# Patient Record
Sex: Female | Born: 1965 | Race: White | Hispanic: No | Marital: Married | Smoking: Former smoker
Health system: Southern US, Community
[De-identification: ages and names within clinical notes are randomized; demographics above are authoritative.]

## PROBLEM LIST (undated history)

## (undated) DIAGNOSIS — C801 Malignant (primary) neoplasm, unspecified: Secondary | ICD-10-CM

## (undated) HISTORY — PX: TONSILLECTOMY: SUR1361

## (undated) HISTORY — PX: ABDOMINAL HYSTERECTOMY: SHX81

## (undated) HISTORY — PX: MOUTH SURGERY: SHX715

## (undated) HISTORY — PX: APPENDECTOMY: SHX54

## (undated) HISTORY — PX: TOTAL ABDOMINAL HYSTERECTOMY W/ BILATERAL SALPINGOOPHORECTOMY: SHX83

---

## 2005-05-22 ENCOUNTER — Ambulatory Visit: Payer: Self-pay | Admitting: Unknown Physician Specialty

## 2006-02-25 ENCOUNTER — Emergency Department: Payer: Self-pay | Admitting: Emergency Medicine

## 2011-09-04 ENCOUNTER — Ambulatory Visit: Payer: Self-pay | Admitting: Internal Medicine

## 2011-09-04 LAB — HM MAMMOGRAPHY: HM Mammogram: NORMAL

## 2013-05-11 ENCOUNTER — Ambulatory Visit: Payer: Self-pay | Admitting: Internal Medicine

## 2013-07-04 IMAGING — MG MMM DGT SCR NO ORDER W/CAD
1 series · 4 of 4 positions shown · non-contrast
Comparison: none

REASON FOR EXAM: SCR MAMMO NO ORDER
COMMENTS:

PROCEDURE:     MMM - MMM DGT SCR NO ORDER W/CAD  - September 04, 2011  [DATE]
RESULT:
Comparisons: 05/22/2005 and 04/27/2003.

[Series 8200: R CC · right · 4 of 4 slices shown]
[im 1/4]
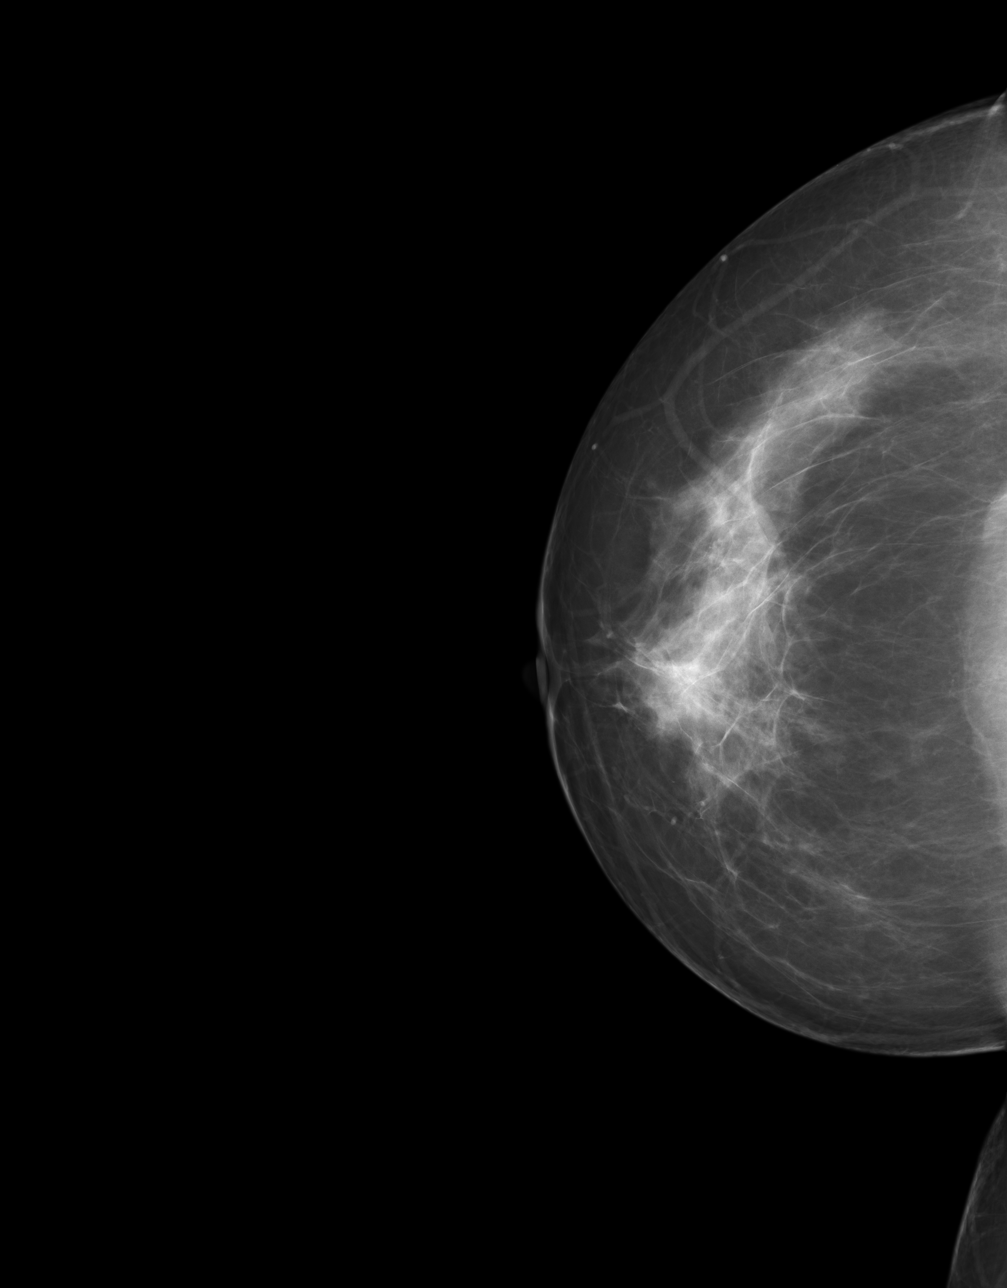
[im 2/4]
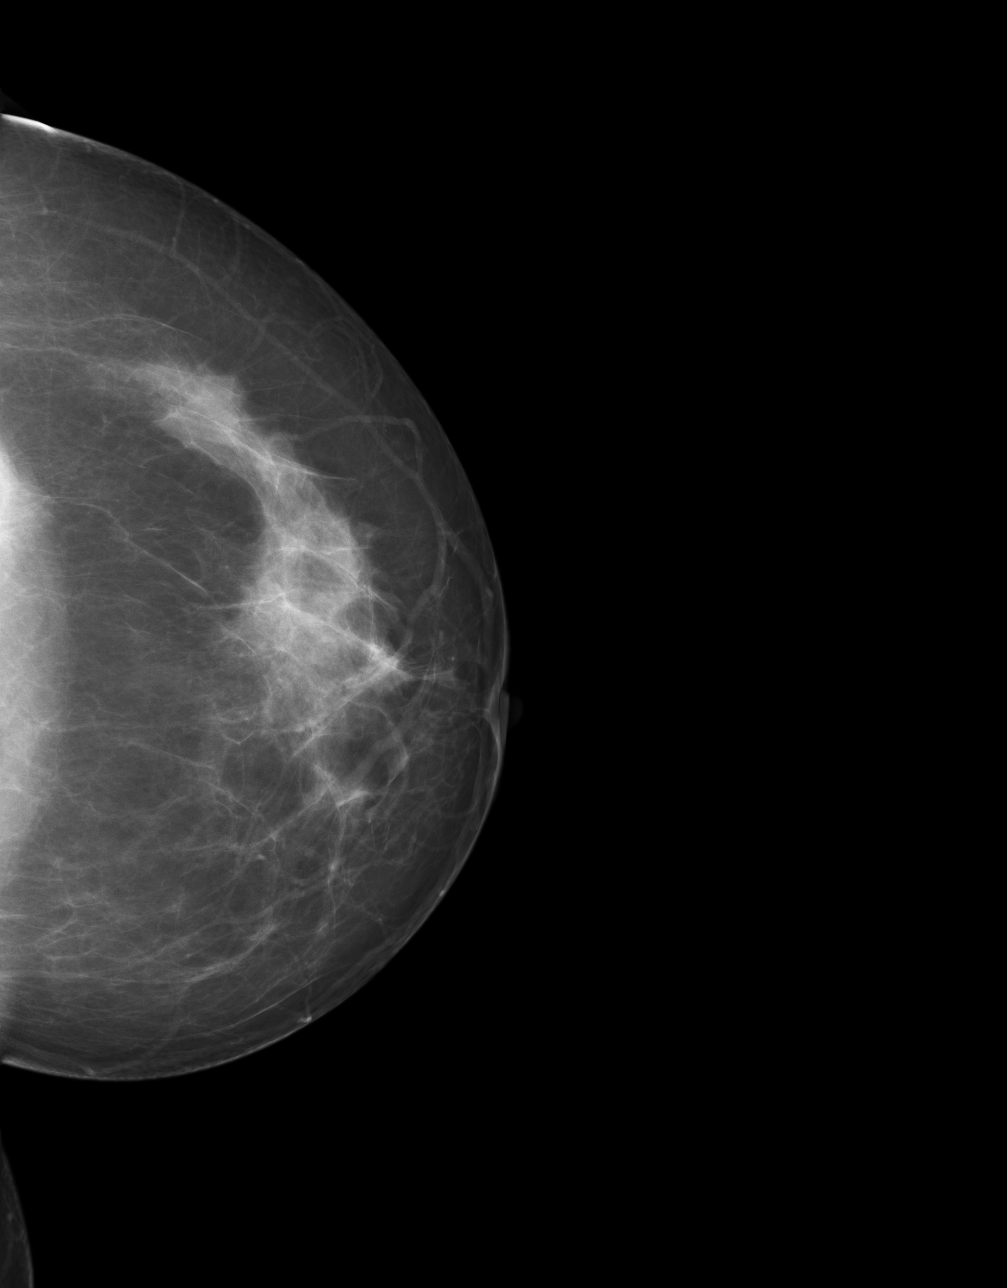
[im 3/4]
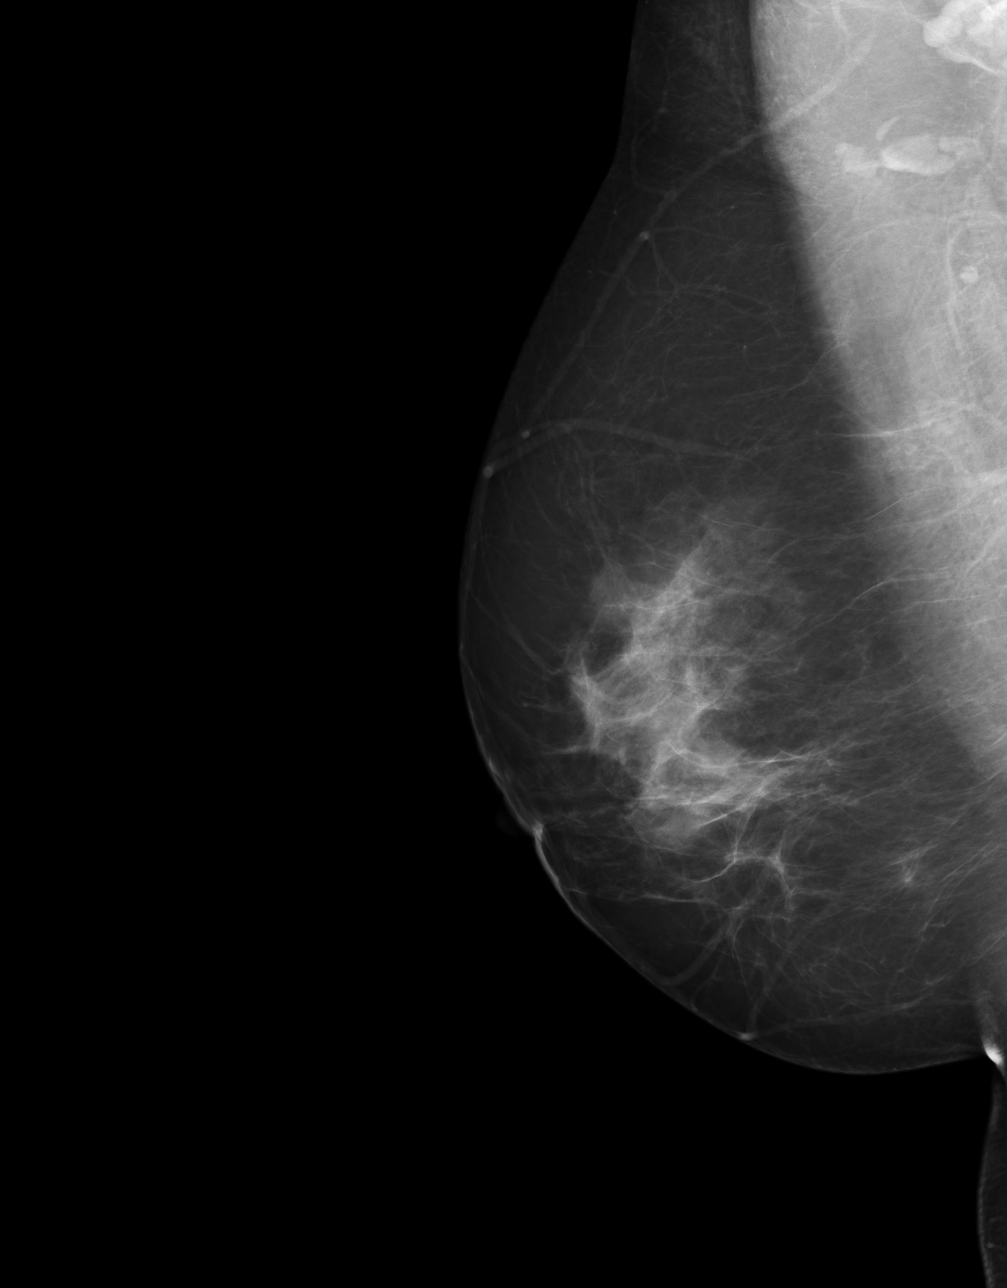
[im 4/4]
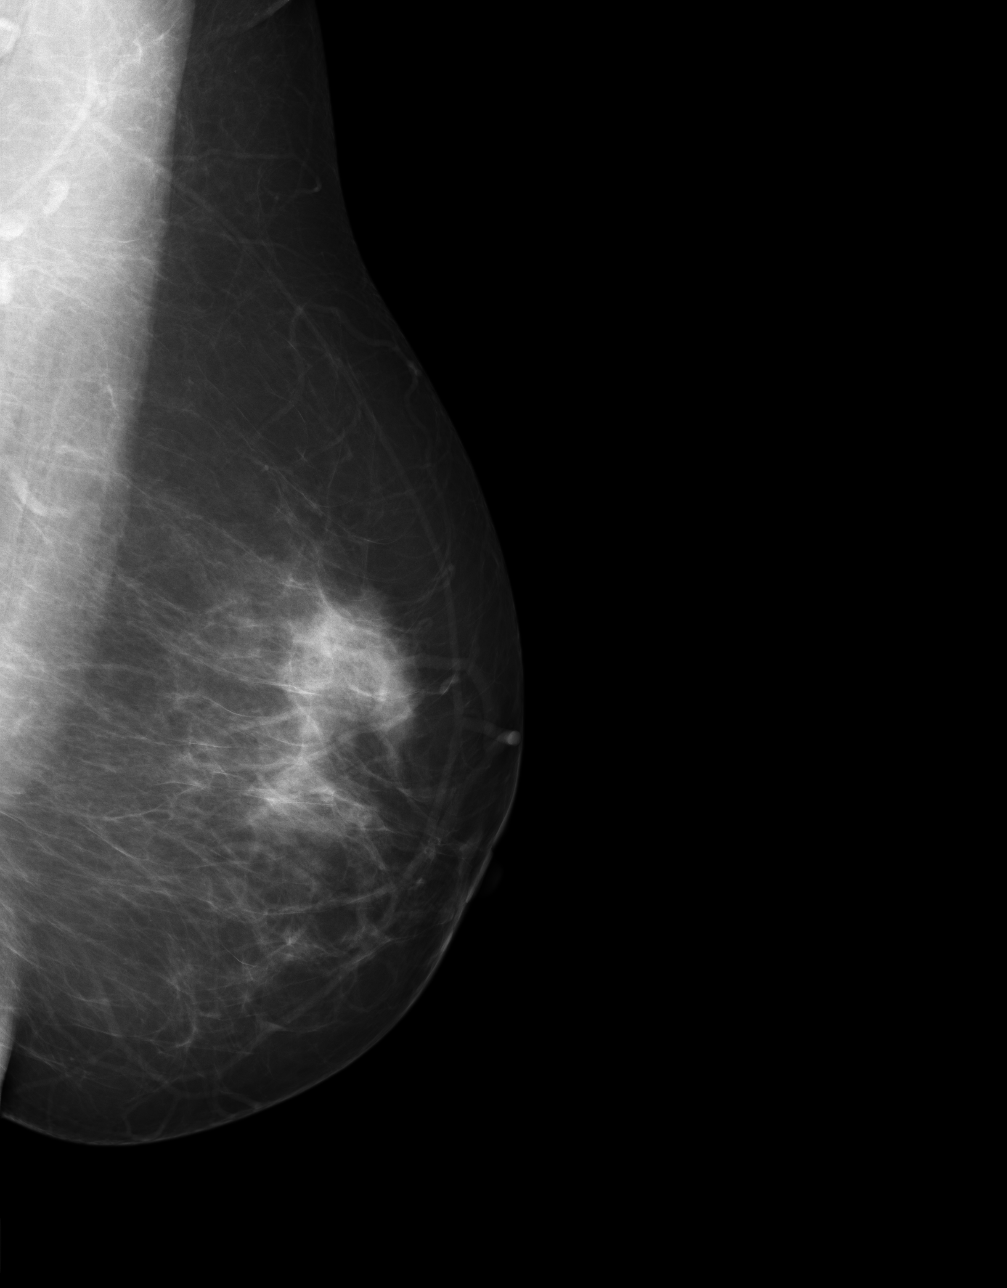

[4 of 4 positions shown; findings below may reference images not displayed]

FINDINGS: The breast tissue is heterogeneously dense. No suspicious masses or
calcifications are identified. No areas of architectural distortion.
IMPRESSION: BI-RADS: Category 1 - Negative

Recommend continued annual screening mammography.

A NEGATIVE MAMMOGRAM REPORT DOES NOT PRECLUDE BIOPSY OR OTHER EVALUATION OF
A CLINICALLY PALPABLE OR OTHERWISE SUSPICIOUS MASS OR LESION. BREAST CANCER
MAY NOT BE DETECTED BY MAMMOGRAPHY IN UP TO 10% OF CASES.

## 2014-04-30 HISTORY — PX: NASAL SINUS SURGERY: SHX719

## 2014-05-20 ENCOUNTER — Ambulatory Visit
Admit: 2014-05-20 | Disposition: A | Discharge status: Home / Self Care | Payer: Self-pay | Attending: Otolaryngology | Admitting: Otolaryngology

## 2014-05-24 LAB — SURGICAL PATHOLOGY

## 2014-06-22 LAB — LIPID PANEL
Cholesterol: 231 mg/dL — AB (ref 0–200)
HDL: 89 mg/dL — AB (ref 35–70)
LDL CALC: 120 mg/dL
TRIGLYCERIDES: 108 mg/dL (ref 40–160)

## 2014-06-22 LAB — TSH: TSH: 1.6 u[IU]/mL (ref <=5.90)

## 2014-06-22 LAB — CBC AND DIFFERENTIAL: HEMOGLOBIN: 13.6 g/dL (ref 12.0–16.0)

## 2014-07-04 ENCOUNTER — Other Ambulatory Visit: Payer: Self-pay | Admitting: Internal Medicine

## 2014-07-04 ENCOUNTER — Encounter: Payer: Self-pay | Admitting: Internal Medicine

## 2014-07-04 DIAGNOSIS — F5101 Primary insomnia: Secondary | ICD-10-CM | POA: Insufficient documentation

## 2014-07-04 DIAGNOSIS — R600 Localized edema: Secondary | ICD-10-CM

## 2014-07-04 DIAGNOSIS — F324 Major depressive disorder, single episode, in partial remission: Secondary | ICD-10-CM | POA: Insufficient documentation

## 2014-07-04 DIAGNOSIS — Z780 Asymptomatic menopausal state: Secondary | ICD-10-CM | POA: Insufficient documentation

## 2014-07-04 DIAGNOSIS — E785 Hyperlipidemia, unspecified: Secondary | ICD-10-CM | POA: Insufficient documentation

## 2014-07-04 DIAGNOSIS — M47816 Spondylosis without myelopathy or radiculopathy, lumbar region: Secondary | ICD-10-CM | POA: Insufficient documentation

## 2014-07-15 ENCOUNTER — Telehealth: Payer: Self-pay

## 2014-07-15 NOTE — Telephone Encounter (Signed)
-----   Message from Otilio Jefferson sent at 07/05/2014  9:36 AM EDT ----- Regarding: colon From: Tia Masker Sent: 06/24/2014  triage

## 2014-07-15 NOTE — Telephone Encounter (Signed)
Tried contacting pt. No vm set up to leave message.  

## 2014-07-19 NOTE — Telephone Encounter (Signed)
Mailed letter to pt to call and schedule colonoscopy.

## 2014-12-19 ENCOUNTER — Other Ambulatory Visit: Payer: Self-pay | Admitting: Internal Medicine

## 2015-01-27 ENCOUNTER — Ambulatory Visit (INDEPENDENT_AMBULATORY_CARE_PROVIDER_SITE_OTHER): Payer: BC Managed Care – PPO | Admitting: Internal Medicine

## 2015-01-27 ENCOUNTER — Other Ambulatory Visit: Payer: Self-pay | Admitting: Internal Medicine

## 2015-01-27 ENCOUNTER — Encounter: Payer: Self-pay | Admitting: Internal Medicine

## 2015-01-27 VITALS — BP 120/100 | HR 70 | Ht 66.0 in | Wt 212.0 lb

## 2015-01-27 DIAGNOSIS — T148 Other injury of unspecified body region: Secondary | ICD-10-CM

## 2015-01-27 DIAGNOSIS — T148XXA Other injury of unspecified body region, initial encounter: Secondary | ICD-10-CM

## 2015-01-27 MED ORDER — METHOCARBAMOL 500 MG PO TABS: 500.0000 mg | ORAL_TABLET | Freq: Three times a day (TID) | ORAL | Status: DC | PRN

## 2015-01-27 MED ORDER — HYDROCODONE-ACETAMINOPHEN 5-325 MG PO TABS: 1.0000 | ORAL_TABLET | Freq: Every evening | ORAL | Status: DC | PRN

## 2015-01-27 NOTE — Progress Notes (Signed)
Date:  01/27/2015   Name:  Jade Harris   DOB:  1965-02-10   MRN:  [id number]   Chief Complaint: Shoulder Pain Shoulder Pain  This is a new problem. The current episode started in the past 7 days. There has been a history of trauma (deep tissue massage). The problem occurs constantly. Pertinent negatives include no fever.  She had a massage 9 days ago.  It was very painful but she continued with the procedure.  Since that time her left upper arm and shoulder have been very painful.  Other body sites are not painful.  She has noticed bruising along the upper arm.    Review of Systems  Constitutional: Negative for fever and chills.  Respiratory: Negative for chest tightness and shortness of breath.   Cardiovascular: Negative for chest pain and palpitations.  Musculoskeletal: Positive for myalgias and neck stiffness.    Patient Active Problem List   Diagnosis Date Noted  . Dyslipidemia 07/04/2014  . Local edema 07/04/2014  . Depression, major, single episode, in partial remission ([place]) 07/04/2014  . Menopause 07/04/2014  . 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  . Idiopathic insomnia 07/04/2014    Prior to Admission medications   Medication Sig Start Date End Date Taking? Authorizing Provider  estradiol (ESTRACE) 1 MG tablet Take 1 tablet by mouth daily. 05/14/14  Yes Historical Provider, MD  fluticasone (FLONASE) 50 MCG/ACT nasal spray Place 2 sprays into the nose daily as needed. 02/11/14  Yes Historical Provider, MD  methocarbamol (ROBAXIN) 500 MG tablet Take 1 tablet by mouth 3 (three) times daily as needed. 06/22/14  Yes Historical Provider, MD  PARoxetine (PAXIL) 30 MG tablet TAKE 1 TABLET BY MOUTH DAILY 12/19/14  Yes Glean Hess, MD  triamterene-hydrochlorothiazide (MAXZIDE-25) 37.5-25 MG tablet Take 1 tablet by mouth daily. 12/19/14  Yes Historical Provider, MD  MULTIPLE VITAMIN PO Take 1 tablet by mouth daily. Reported on 01/27/2015    Historical  Provider, MD    Allergies  Allergen Reactions  . Sulfa Antibiotics     Past Surgical History  Procedure Laterality Date  . Nasal sinus surgery    . Tonsillectomy    . Appendectomy    . Total abdominal hysterectomy w/ bilateral salpingoophorectomy      wtih oophorectomy    Social History  Substance Use Topics  . Smoking status: Former Research scientist (life sciences)  . Smokeless tobacco: None  . Alcohol Use: No    Medication list has been reviewed and updated.   Physical Exam  Constitutional: She appears well-developed and well-nourished. She appears distressed.  Cardiovascular: Normal rate, regular rhythm and normal heart sounds.   Pulmonary/Chest: Effort normal and breath sounds normal. She has no wheezes.  Musculoskeletal:       Left shoulder: She exhibits normal range of motion and no bony tenderness.       Left upper arm: She exhibits tenderness. Swelling: warmth and bruising.       Arms: Nursing note and vitals reviewed.   BP 120/100 mmHg  Pulse 70  Ht 5\' 6"  (1.676 m)  Wt 212 lb (96.163 kg)  BMI 34.23 kg/m2  Assessment and Plan: 1. Contusion of muscle Bufferin 325 mg tid with food Heat to affected area - HYDROcodone-acetaminophen (NORCO/VICODIN) 5-325 MG tablet; Take 1 tablet by mouth at bedtime as needed for moderate pain.  Dispense: 20 tablet; Refill: 0 - methocarbamol (ROBAXIN) 500 MG tablet; Take 1 tablet (500 mg total) by mouth 3 (three) times daily as needed.  Dispense: 90 tablet; Refill: 0   Halina Maidens, MD [facility]  01/27/2015

## 2015-03-30 ENCOUNTER — Ambulatory Visit (INDEPENDENT_AMBULATORY_CARE_PROVIDER_SITE_OTHER): Payer: BC Managed Care – PPO | Admitting: Internal Medicine

## 2015-03-30 ENCOUNTER — Encounter: Payer: Self-pay | Admitting: Internal Medicine

## 2015-03-30 VITALS — BP 112/80 | HR 76 | Ht 66.0 in | Wt 207.2 lb

## 2015-03-30 DIAGNOSIS — N3000 Acute cystitis without hematuria: Secondary | ICD-10-CM | POA: Diagnosis not present

## 2015-03-30 LAB — POC URINALYSIS WITH MICROSCOPIC (NON AUTO)MANUAL RESULT
Bilirubin, UA: NEGATIVE
CRYSTALS: 0
Epithelial cells, urine per micros: 2
Glucose, UA: NEGATIVE
Ketones, UA: NEGATIVE
Leukocytes, UA: NEGATIVE
MUCUS UA: 0
Nitrite, UA: NEGATIVE
PROTEIN UA: 30
RBC UA: NEGATIVE
RBC: 1 M/uL — AB (ref 4.04–5.48)
Spec Grav, UA: 1.02
Urobilinogen, UA: 0.2
WBC Casts, UA: 5
pH, UA: 6.5

## 2015-03-30 MED ORDER — CIPROFLOXACIN HCL 250 MG PO TABS: 250.0000 mg | ORAL_TABLET | Freq: Two times a day (BID) | ORAL | Status: DC

## 2015-03-30 NOTE — Patient Instructions (Signed)

## 2015-03-30 NOTE — Progress Notes (Signed)
    Date:  03/30/2015   Name:  Jade Harris   DOB:  1965-10-02   MRN:  [id number]   Chief Complaint: Urinary Tract Infection Urinary Tract Infection  This is a new problem. The current episode started yesterday. The problem occurs every urination. The problem has been unchanged. The quality of the pain is described as burning. The patient is experiencing no pain. There has been no fever. Associated symptoms include urgency. Pertinent negatives include no chills, discharge, flank pain, frequency, hematuria or vomiting. She has tried increased fluids for the symptoms.     Review of Systems  Constitutional: Negative for chills.  HENT: Positive for congestion. Negative for sinus pressure.   Respiratory: Negative for chest tightness, shortness of breath and wheezing.   Cardiovascular: Negative for chest pain.  Gastrointestinal: Negative for vomiting, constipation and blood in stool.  Endocrine: Positive for polyuria. Negative for polydipsia.  Genitourinary: Positive for dysuria and urgency. Negative for frequency, hematuria, flank pain and pelvic pain.    Patient Active Problem List   Diagnosis Date Noted  . Dyslipidemia 07/04/2014  . Local edema 07/04/2014  . Depression, major, single episode, in partial remission ([place]) 07/04/2014  . Menopause 07/04/2014  . 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  . Idiopathic insomnia 07/04/2014    Prior to Admission medications   Medication Sig Start Date End Date Taking? Authorizing Provider  estradiol (ESTRACE) 1 MG tablet Take 1 tablet by mouth daily. 05/14/14  Yes Historical Provider, MD  PARoxetine (PAXIL) 30 MG tablet TAKE 1 TABLET BY MOUTH DAILY 12/19/14  Yes Glean Hess, MD  triamterene-hydrochlorothiazide (MAXZIDE-25) 37.5-25 MG tablet Take 1 tablet by mouth daily. 12/19/14  Yes Historical Provider, MD  fluticasone (FLONASE) 50 MCG/ACT nasal spray Place 2 sprays into the nose daily as needed. Reported on 03/30/2015 02/11/14    Historical Provider, MD  methocarbamol (ROBAXIN) 500 MG tablet Take 1 tablet (500 mg total) by mouth 3 (three) times daily as needed. Patient not taking: Reported on 03/30/2015 01/27/15   Glean Hess, MD    Allergies  Allergen Reactions  . Sulfa Antibiotics     Past Surgical History  Procedure Laterality Date  . Nasal sinus surgery    . Tonsillectomy    . Appendectomy    . Total abdominal hysterectomy w/ bilateral salpingoophorectomy      wtih oophorectomy    Social History  Substance Use Topics  . Smoking status: Former Research scientist (life sciences)  . Smokeless tobacco: None  . Alcohol Use: 0.0 oz/week    0 Standard drinks or equivalent per week     Comment: occasional     Medication list has been reviewed and updated.   Physical Exam  Constitutional: She appears well-developed.  Cardiovascular: Normal rate, regular rhythm and normal heart sounds.   Pulmonary/Chest: Effort normal and breath sounds normal.  Abdominal: There is no tenderness. There is no CVA tenderness.  Nursing note and vitals reviewed.   BP 112/80 mmHg  Pulse 76  Ht 5\' 6"  (1.676 m)  Wt 207 lb 3.2 oz (93.985 kg)  BMI 33.46 kg/m2  Assessment and Plan: 1. Acute cystitis without hematuria Continue fluids and AZO if helpful - POC urinalysis w microscopic (non auto) - ciprofloxacin (CIPRO) 250 MG tablet; Take 1 tablet (250 mg total) by mouth 2 (two) times daily.  Dispense: 10 tablet; Refill: 0   Halina Maidens, MD [facility]  03/30/2015

## 2015-06-03 ENCOUNTER — Other Ambulatory Visit: Payer: Self-pay | Admitting: Internal Medicine

## 2015-06-10 NOTE — Telephone Encounter (Signed)
pts coming in on 8/22 for cpe

## 2015-09-20 ENCOUNTER — Encounter: Payer: Self-pay | Admitting: Internal Medicine

## 2015-09-20 ENCOUNTER — Ambulatory Visit (INDEPENDENT_AMBULATORY_CARE_PROVIDER_SITE_OTHER): Payer: BC Managed Care – PPO | Admitting: Internal Medicine

## 2015-09-20 VITALS — BP 118/82 | HR 66 | Resp 16 | Ht 65.0 in | Wt 207.0 lb

## 2015-09-20 DIAGNOSIS — Z1239 Encounter for other screening for malignant neoplasm of breast: Secondary | ICD-10-CM | POA: Diagnosis not present

## 2015-09-20 DIAGNOSIS — R519 Headache, unspecified: Secondary | ICD-10-CM | POA: Insufficient documentation

## 2015-09-20 DIAGNOSIS — Z1159 Encounter for screening for other viral diseases: Secondary | ICD-10-CM | POA: Diagnosis not present

## 2015-09-20 DIAGNOSIS — Z0000 Encounter for general adult medical examination without abnormal findings: Secondary | ICD-10-CM

## 2015-09-20 DIAGNOSIS — R51 Headache: Secondary | ICD-10-CM

## 2015-09-20 DIAGNOSIS — F324 Major depressive disorder, single episode, in partial remission: Secondary | ICD-10-CM

## 2015-09-20 DIAGNOSIS — R600 Localized edema: Secondary | ICD-10-CM | POA: Diagnosis not present

## 2015-09-20 DIAGNOSIS — E785 Hyperlipidemia, unspecified: Secondary | ICD-10-CM

## 2015-09-20 DIAGNOSIS — Z1211 Encounter for screening for malignant neoplasm of colon: Secondary | ICD-10-CM

## 2015-09-20 LAB — POCT URINALYSIS DIPSTICK
Bilirubin, UA: NEGATIVE
Glucose, UA: NEGATIVE
Ketones, UA: NEGATIVE
Leukocytes, UA: NEGATIVE
NITRITE UA: NEGATIVE
PH UA: 6
PROTEIN UA: NEGATIVE
RBC UA: NEGATIVE
Spec Grav, UA: 1.015

## 2015-09-20 MED ORDER — TOPIRAMATE 25 MG PO TABS: 75.0000 mg | ORAL_TABLET | Freq: Every day | ORAL | 1 refills | Status: DC

## 2015-09-20 MED ORDER — PAROXETINE HCL 30 MG PO TABS: 30.0000 mg | ORAL_TABLET | Freq: Every day | ORAL | 1 refills | Status: DC

## 2015-09-20 NOTE — Progress Notes (Signed)
Date:  09/20/2015   Name:  Jade Harris   DOB:  08-10-1965   MRN:  [id number]   Chief Complaint: Annual Exam Jade Harris is a 50 y.o. female who presents today for her Complete Annual Exam. She feels poorly. She reports exercising none. She reports she is sleeping poorly.   Hyperlipidemia  This is a chronic problem. The current episode started more than 1 year ago. The problem is uncontrolled. Pertinent negatives include no chest pain or shortness of breath. Current antihyperlipidemic treatment includes diet change. The current treatment provides mild improvement of lipids.   Edema - controlled with diuretics.  She is following a fairly healthy diet with low sodium.  Depression - mood is depressed due to current concerns about chronic headache.  Headache - located on left temple and behind left eye 5/7 days awakens with HA.  The only help has been sudafed which relieves it temporarily.  These are much worse since sinus surgery.  ENT does not know why they are worse.  She is very frustrated and tearful regarding this issue.  Allergies - now on allergy drops for the past 6 months.  She has chronic sinus congestion as well.  She uses [employer] and flonase.   Review of Systems  Constitutional: Positive for fatigue and unexpected weight change (gained weight due to inactivity). Negative for diaphoresis and fever.  HENT: Positive for congestion, postnasal drip and sinus pressure. Negative for facial swelling, hearing loss and trouble swallowing.   Eyes: Negative for visual disturbance.  Respiratory: Negative for cough, chest tightness and shortness of breath.   Cardiovascular: Negative for chest pain, palpitations and leg swelling.  Gastrointestinal: Negative for abdominal pain, blood in stool and constipation.  Endocrine: Negative for polydipsia and polyuria.  Genitourinary: Negative for dysuria, frequency, hematuria and vaginal discharge.  Musculoskeletal: Positive for back  pain. Negative for joint swelling.  Skin: Negative for color change and rash.  Neurological: Positive for headaches. Negative for dizziness, seizures and syncope.  Hematological: Negative for adenopathy.  Psychiatric/Behavioral: Positive for dysphoric mood and sleep disturbance.    Patient Active Problem List   Diagnosis Date Noted  . Dyslipidemia 07/04/2014  . Local edema 07/04/2014  . Depression, major, single episode, in partial remission ([place]) 07/04/2014  . Menopause 07/04/2014  . 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  . Idiopathic insomnia 07/04/2014    Prior to Admission medications   Medication Sig Start Date End Date Taking? Authorizing Provider  estradiol (ESTRACE) 1 MG tablet TAKE 1 TABLET BY MOUTH DAILY 06/03/15  Yes Glean Hess, MD  fluticasone [facility]) 50 MCG/ACT nasal spray Place 2 sprays into the nose daily as needed. Reported on 03/30/2015 02/11/14  Yes Historical Provider, MD  PARoxetine (PAXIL) 30 MG tablet TAKE 1 TABLET BY MOUTH DAILY 12/19/14  Yes Glean Hess, MD  triamterene-hydrochlorothiazide (MAXZIDE-25) 37.5-25 MG tablet TAKE 1 TABLET BY MOUTH DAILY 06/03/15  Yes Glean Hess, MD    Allergies  Allergen Reactions  . Sulfa Antibiotics     Past Surgical History:  Procedure Laterality Date  . APPENDECTOMY    . MOUTH SURGERY    . NASAL SINUS SURGERY  04/2014  . TONSILLECTOMY    . TOTAL ABDOMINAL HYSTERECTOMY W/ BILATERAL SALPINGOOPHORECTOMY     wtih oophorectomy    Social History  Substance Use Topics  . Smoking status: Former Research scientist (life sciences)  . Smokeless tobacco: Never Used  . Alcohol use 0.0 oz/week     Comment: occasional  Medication list has been reviewed and updated.   Physical Exam  Constitutional: She is oriented to person, place, and time. She appears well-developed and well-nourished. She appears distressed.  HENT:  Head: Normocephalic and atraumatic.  Right Ear: Tympanic membrane and ear canal normal.  Left Ear:  Tympanic membrane and ear canal normal.  Nose: Right sinus exhibits no maxillary sinus tenderness. Left sinus exhibits no maxillary sinus tenderness.  Mouth/Throat: Uvula is midline and oropharynx is clear and moist.  Eyes: Conjunctivae and EOM are normal. Right eye exhibits no discharge. Left eye exhibits no discharge. No scleral icterus.  Neck: Normal range of motion and full passive range of motion without pain. Neck supple. No spinous process tenderness and no muscular tenderness present. Carotid bruit is not present. No erythema present. No thyromegaly present.  Cardiovascular: Normal rate, regular rhythm, normal heart sounds and normal pulses.   Pulmonary/Chest: Effort normal and breath sounds normal. No respiratory distress. She has no wheezes. Right breast exhibits no mass, no nipple discharge, no skin change and no tenderness. Left breast exhibits no mass, no nipple discharge, no skin change and no tenderness.  Abdominal: Soft. Bowel sounds are normal. There is no hepatosplenomegaly. There is no tenderness. There is no CVA tenderness.  Lymphadenopathy:    She has no cervical adenopathy.    She has no axillary adenopathy.  Neurological: She is alert and oriented to person, place, and time. She has normal strength and normal reflexes. No cranial nerve deficit or sensory deficit. Gait normal.  Skin: Skin is warm, dry and intact. No rash noted.  Psychiatric: Her speech is normal and behavior is normal. Thought content normal. Cognition and memory are normal. She exhibits a depressed mood.  Nursing note and vitals reviewed.   BP 118/82 (BP Location: Right Arm, Patient Position: Sitting, Cuff Size: Normal)   Pulse 66   Resp 16   Ht 5\' 5"  (1.651 m)   Wt 207 lb (93.9 kg)   SpO2 97%   BMI 34.45 kg/m   Assessment and Plan: 1. Annual physical exam - POCT urinalysis dipstick  2. Breast cancer screening - MM DIGITAL SCREENING BILATERAL; Future  3. Headache, chronic daily Needs more  aggressive management - may be migraine variant (less suspicious for sinus/allergy related) Will begin Topamax - titrate up to 3 at HS - topiramate (TOPAMAX) 25 MG tablet; Take 3 tablets (75 mg total) by mouth at bedtime.  Dispense: 90 tablet; Refill: 1 - Ambulatory referral to Neurology  4. Depression, major, single episode, in partial remission ([place]) Continue current therapy - TSH - PARoxetine (PAXIL) 30 MG tablet; Take 1 tablet (30 mg total) by mouth daily.  Dispense: 90 tablet; Refill: 1  5. Dyslipidemia Advise if medication is needed - Lipid panel  6. Local edema controlled - CBC with Differential/Platelet - Comprehensive metabolic panel  7. Colon cancer screening Check on Cologuard coverage  8. Need for hepatitis C screening test - Hepatitis C antibody   Halina Maidens, MD [facility]  09/20/2015

## 2015-09-21 LAB — COMPREHENSIVE METABOLIC PANEL
ALBUMIN: 4.5 g/dL (ref 3.5–5.5)
ALT: 14 IU/L (ref 0–32)
AST: 15 IU/L (ref 0–40)
Albumin/Globulin Ratio: 1.4 (ref 1.2–2.2)
Alkaline Phosphatase: 70 IU/L (ref 39–117)
BUN / CREAT RATIO: 17 (ref 9–23)
BUN: 13 mg/dL (ref 6–24)
Bilirubin Total: 0.2 mg/dL (ref 0.0–1.2)
CO2: 22 mmol/L (ref 18–29)
CREATININE: 0.76 mg/dL (ref 0.57–1.00)
Calcium: 9.7 mg/dL (ref 8.7–10.2)
Chloride: 97 mmol/L (ref 96–106)
GFR calc Af Amer: 104 mL/min/{1.73_m2} (ref >59)
GFR calc non Af Amer: 90 mL/min/{1.73_m2} (ref >59)
GLOBULIN, TOTAL: 3.2 g/dL (ref 1.5–4.5)
Glucose: 81 mg/dL (ref 65–99)
Potassium: 4.3 mmol/L (ref 3.5–5.2)
Sodium: 140 mmol/L (ref 134–144)
TOTAL PROTEIN: 7.7 g/dL (ref 6.0–8.5)

## 2015-09-21 LAB — CBC WITH DIFFERENTIAL/PLATELET
BASOS: 0 %
Basophils Absolute: 0 10*3/uL (ref 0.0–0.2)
EOS (ABSOLUTE): 0 10*3/uL (ref 0.0–0.4)
EOS: 1 %
HEMATOCRIT: 42.6 % (ref 34.0–46.6)
HEMOGLOBIN: 13.6 g/dL (ref 11.1–15.9)
IMMATURE GRANS (ABS): 0 10*3/uL (ref 0.0–0.1)
IMMATURE GRANULOCYTES: 0 %
Lymphocytes Absolute: 1.5 10*3/uL (ref 0.7–3.1)
Lymphs: 29 %
MCH: 29.2 pg (ref 26.6–33.0)
MCHC: 31.9 g/dL (ref 31.5–35.7)
MCV: 92 fL (ref 79–97)
MONOCYTES: 8 %
Monocytes Absolute: 0.4 10*3/uL (ref 0.1–0.9)
Neutrophils Absolute: 3.3 10*3/uL (ref 1.4–7.0)
Neutrophils: 62 %
Platelets: 291 10*3/uL (ref 150–379)
RBC: 4.65 x10E6/uL (ref 3.77–5.28)
RDW: 13.7 % (ref 12.3–15.4)
WBC: 5.2 10*3/uL (ref 3.4–10.8)

## 2015-09-21 LAB — LIPID PANEL
CHOL/HDL RATIO: 2.8 ratio (ref 0.0–4.4)
Cholesterol, Total: 227 mg/dL — ABNORMAL HIGH (ref 100–199)
HDL: 82 mg/dL (ref >39)
LDL Calculated: 122 mg/dL — ABNORMAL HIGH (ref 0–99)
TRIGLYCERIDES: 113 mg/dL (ref 0–149)
VLDL Cholesterol Cal: 23 mg/dL (ref 5–40)

## 2015-09-21 LAB — TSH: TSH: 1.48 u[IU]/mL (ref 0.450–4.500)

## 2015-09-21 LAB — HEPATITIS C ANTIBODY: Hep C Virus Ab: <0.1 s/co ratio (ref 0.0–0.9)

## 2015-11-01 ENCOUNTER — Other Ambulatory Visit: Payer: Self-pay | Admitting: Neurology

## 2015-11-01 DIAGNOSIS — G43719 Chronic migraine without aura, intractable, without status migrainosus: Secondary | ICD-10-CM

## 2015-11-01 DIAGNOSIS — R410 Disorientation, unspecified: Secondary | ICD-10-CM

## 2015-11-01 DIAGNOSIS — R413 Other amnesia: Secondary | ICD-10-CM

## 2015-11-03 ENCOUNTER — Ambulatory Visit
Admission: RE | Admit: 2015-11-03 | Discharge: 2015-11-03 | Disposition: A | Discharge status: Home / Self Care | Payer: BC Managed Care – PPO | Source: Ambulatory Visit | Attending: Neurology | Admitting: Neurology

## 2015-11-03 DIAGNOSIS — R410 Disorientation, unspecified: Secondary | ICD-10-CM | POA: Diagnosis not present

## 2015-11-03 DIAGNOSIS — G43719 Chronic migraine without aura, intractable, without status migrainosus: Secondary | ICD-10-CM | POA: Diagnosis not present

## 2015-11-03 DIAGNOSIS — R413 Other amnesia: Secondary | ICD-10-CM | POA: Diagnosis not present

## 2015-11-03 MED ORDER — GADOBENATE DIMEGLUMINE 529 MG/ML IV SOLN
20.0000 mL | Freq: Once | INTRAVENOUS | Status: AC | PRN
  Administered 2015-11-03: 19 mL via INTRAVENOUS

## 2015-11-18 ENCOUNTER — Ambulatory Visit (INDEPENDENT_AMBULATORY_CARE_PROVIDER_SITE_OTHER): Payer: BC Managed Care – PPO | Admitting: Internal Medicine

## 2015-11-18 ENCOUNTER — Encounter: Payer: Self-pay | Admitting: Internal Medicine

## 2015-11-18 VITALS — BP 122/84 | HR 64 | Resp 16 | Ht 65.0 in | Wt 205.0 lb

## 2015-11-18 DIAGNOSIS — N3000 Acute cystitis without hematuria: Secondary | ICD-10-CM

## 2015-11-18 DIAGNOSIS — J0100 Acute maxillary sinusitis, unspecified: Secondary | ICD-10-CM | POA: Diagnosis not present

## 2015-11-18 DIAGNOSIS — Z23 Encounter for immunization: Secondary | ICD-10-CM | POA: Diagnosis not present

## 2015-11-18 LAB — POC URINALYSIS WITH MICROSCOPIC (NON AUTO)MANUAL RESULT
Bilirubin, UA: NEGATIVE
Blood, UA: NEGATIVE
Crystals: 0
Epithelial cells, urine per micros: 2
Glucose, UA: NEGATIVE
KETONES UA: NEGATIVE
Leukocytes, UA: NEGATIVE
MUCUS UA: 0
NITRITE UA: NEGATIVE
PH UA: 6.5
PROTEIN UA: NEGATIVE
RBC: 0 M/uL — AB (ref 4.04–5.48)
Spec Grav, UA: 1.01
Urobilinogen, UA: 0.2
WBC CASTS UA: 0

## 2015-11-18 MED ORDER — CEFUROXIME AXETIL 500 MG PO TABS: 500.0000 mg | ORAL_TABLET | Freq: Two times a day (BID) | ORAL | 0 refills | Status: DC

## 2015-11-18 MED ORDER — TRIAMTERENE-HCTZ 37.5-25 MG PO TABS: 1.0000 | ORAL_TABLET | Freq: Every day | ORAL | 5 refills | Status: DC

## 2015-11-18 NOTE — Progress Notes (Signed)
Date:  11/18/2015   Name:  Jade Harris   DOB:  1965/12/03   MRN:  [id number]   Chief Complaint: Urinary Urgency (2 days ) Urinary Frequency   This is a new problem. The current episode started yesterday. The problem occurs every urination. The problem has been unchanged. Associated symptoms include frequency and urgency. Pertinent negatives include no hematuria.  Sinusitis  This is a new problem. The current episode started yesterday. The problem has been gradually worsening since onset. There has been no fever. Associated symptoms include congestion and sinus pressure. Pertinent negatives include no coughing, diaphoresis or shortness of breath. Past treatments include oral decongestants, spray decongestants and acetaminophen.      Review of Systems  Constitutional: Negative for diaphoresis, fatigue and fever.  HENT: Positive for congestion, postnasal drip and sinus pressure. Negative for hearing loss.   Respiratory: Negative for cough, chest tightness and shortness of breath.   Cardiovascular: Negative for chest pain and palpitations.  Gastrointestinal: Negative for abdominal pain.  Genitourinary: Positive for frequency and urgency. Negative for dysuria and hematuria.    Patient Active Problem List   Diagnosis Date Noted  . Headache, chronic daily 09/20/2015  . Dyslipidemia 07/04/2014  . Local edema 07/04/2014  . Depression, major, single episode, in partial remission ([place]) 07/04/2014  . Menopause 07/04/2014  . 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  . Idiopathic insomnia 07/04/2014    Prior to Admission medications   Medication Sig Start Date End Date Taking? Authorizing Provider  EPINEPHrine 0.3 mg/0.3 mL IJ SOAJ injection Inject as directed.   Yes Historical Provider, MD  estradiol (ESTRACE) 1 MG tablet TAKE 1 TABLET BY MOUTH DAILY 06/03/15  Yes Glean Hess, MD  fluticasone [facility]) 50 MCG/ACT nasal spray Place 2 sprays into the nose daily as  needed. Reported on 03/30/2015 02/11/14  Yes Historical Provider, MD  PARoxetine (PAXIL) 30 MG tablet Take 1 tablet (30 mg total) by mouth daily. 09/20/15  Yes Glean Hess, MD  topiramate (TOPAMAX) 25 MG tablet Take 3 tablets (75 mg total) by mouth at bedtime. 09/20/15  Yes Glean Hess, MD  triamterene-hydrochlorothiazide (MAXZIDE-25) 37.5-25 MG tablet TAKE 1 TABLET BY MOUTH DAILY 06/03/15  Yes Glean Hess, MD    Allergies  Allergen Reactions  . Sulfa Antibiotics Other (See Comments)    Past Surgical History:  Procedure Laterality Date  . APPENDECTOMY    . MOUTH SURGERY    . NASAL SINUS SURGERY  04/2014  . TONSILLECTOMY    . TOTAL ABDOMINAL HYSTERECTOMY W/ BILATERAL SALPINGOOPHORECTOMY     wtih oophorectomy    Social History  Substance Use Topics  . Smoking status: Former Research scientist (life sciences)  . Smokeless tobacco: Never Used  . Alcohol use 0.0 oz/week     Comment: occasional     Medication list has been reviewed and updated.   Physical Exam  Constitutional: She is oriented to person, place, and time. She appears well-developed and well-nourished.  HENT:  Right Ear: External ear and ear canal normal. Tympanic membrane is not erythematous and not retracted.  Left Ear: External ear and ear canal normal. Tympanic membrane is not erythematous and not retracted.  Nose: Right sinus exhibits maxillary sinus tenderness and frontal sinus tenderness. Left sinus exhibits maxillary sinus tenderness and frontal sinus tenderness.  Mouth/Throat: Uvula is midline and mucous membranes are normal. No oral lesions. Posterior oropharyngeal erythema present. No oropharyngeal exudate.  Cardiovascular: Normal rate, regular rhythm and normal heart sounds.  Pulmonary/Chest: Breath sounds normal. She has no wheezes. She has no rales.  Abdominal: Soft. Bowel sounds are normal. There is no tenderness.  Lymphadenopathy:    She has no cervical adenopathy.  Neurological: She is alert and oriented to person,  place, and time.    BP 122/84   Pulse 64   Resp 16   Ht 5\' 5"  (1.651 m)   Wt 205 lb (93 kg)   SpO2 98%   BMI 34.11 kg/m   Assessment and Plan: 1. Acute cystitis without hematuria - POC urinalysis w microscopic (non auto) - cefUROXime (CEFTIN) 500 MG tablet; Take 1 tablet (500 mg total) by mouth 2 (two) times daily with a meal.  Dispense: 20 tablet; Refill: 0  2. Acute non-recurrent maxillary sinusitis - cefUROXime (CEFTIN) 500 MG tablet; Take 1 tablet (500 mg total) by mouth 2 (two) times daily with a meal.  Dispense: 20 tablet; Refill: 0  3. Need for influenza vaccination - Flu Vaccine QUAD 36+ mos IM   Halina Maidens, MD [facility]  11/18/2015

## 2015-12-06 ENCOUNTER — Other Ambulatory Visit: Payer: Self-pay | Admitting: Internal Medicine

## 2015-12-06 ENCOUNTER — Ambulatory Visit (INDEPENDENT_AMBULATORY_CARE_PROVIDER_SITE_OTHER): Payer: BC Managed Care – PPO | Admitting: Internal Medicine

## 2015-12-06 ENCOUNTER — Encounter: Payer: Self-pay | Admitting: Internal Medicine

## 2015-12-06 VITALS — BP 124/72 | HR 68 | Ht 65.0 in | Wt 206.0 lb

## 2015-12-06 DIAGNOSIS — N3001 Acute cystitis with hematuria: Secondary | ICD-10-CM | POA: Diagnosis not present

## 2015-12-06 LAB — POC URINALYSIS WITH MICROSCOPIC (NON AUTO)MANUAL RESULT
BILIRUBIN UA: NEGATIVE
CRYSTALS: 0
Epithelial cells, urine per micros: 0
Glucose, UA: NEGATIVE
Ketones, UA: NEGATIVE
Mucus, UA: 0
Nitrite, UA: NEGATIVE
PH UA: 7.5
RBC: 50 M/uL — AB (ref 4.04–5.48)
Spec Grav, UA: 1.01
Urobilinogen, UA: 0.2
WBC CASTS UA: 20

## 2015-12-06 MED ORDER — CIPROFLOXACIN HCL 500 MG PO TABS: 500.0000 mg | ORAL_TABLET | Freq: Two times a day (BID) | ORAL | 0 refills | Status: DC

## 2015-12-06 NOTE — Progress Notes (Signed)
Date:  12/06/2015   Name:  Jade Harris   DOB:  1965/02/08   MRN:  [id number]   Chief Complaint: Hematuria (3 days ago started having pain/ pressure in lower pelvic area- cramping. Blood was heavy in urine this morning and has continued through out the day.) Started having left flank discomfort about 5 days ago this then progressed to suprapubic discomfort with frequent urination urgency and loss of bladder control. This a.m. she noted frank blood in her urine which has lightened during the day with increased fluids. 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 She also has some chronic back pain which did not seem especially different. She has no personal history of kidney stones. She denies fever but she did have chills and sweats. No nausea vomiting or diarrhea.   Review of Systems  Constitutional: Positive for chills, diaphoresis and fever.  Respiratory: Negative for chest tightness and shortness of breath.   Cardiovascular: Negative for chest pain and palpitations.  Gastrointestinal: Positive for abdominal pain. Negative for blood in stool and constipation.  Genitourinary: Positive for frequency, hematuria and urgency.  Musculoskeletal: Positive for back pain.    Patient Active Problem List   Diagnosis Date Noted  . Headache, chronic daily 09/20/2015  . Dyslipidemia 07/04/2014  . Local edema 07/04/2014  . Depression, major, single episode, in partial remission ([place]) 07/04/2014  . Menopause 07/04/2014  . 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  . Idiopathic insomnia 07/04/2014    Prior to Admission medications   Medication Sig Start Date End Date Taking? Authorizing Provider  EPINEPHrine 0.3 mg/0.3 mL IJ SOAJ injection Inject as directed.   Yes Historical Provider, MD  estradiol (ESTRACE) 1 MG tablet TAKE 1 TABLET BY MOUTH DAILY 06/03/15  Yes Glean Hess, MD  fluticasone [facility]) 50 MCG/ACT nasal spray Place  2 sprays into the nose daily as needed. Reported on 03/30/2015 02/11/14  Yes Historical Provider, MD  PARoxetine (PAXIL) 30 MG tablet Take 1 tablet (30 mg total) by mouth daily. 09/20/15  Yes Glean Hess, MD  topiramate (TOPAMAX) 25 MG tablet Take 3 tablets (75 mg total) by mouth at bedtime. Patient taking differently: Take 100 mg by mouth at bedtime.  09/20/15  Yes Glean Hess, MD  triamterene-hydrochlorothiazide (MAXZIDE-25) 37.5-25 MG tablet Take 1 tablet by mouth daily. 11/18/15  Yes Glean Hess, MD  cefUROXime (CEFTIN) 500 MG tablet Take 1 tablet (500 mg total) by mouth 2 (two) times daily with a meal. Patient not taking: Reported on 12/06/2015 11/18/15   Glean Hess, MD    Allergies  Allergen Reactions  . Sulfa Antibiotics Other (See Comments)    Past Surgical History:  Procedure Laterality Date  . APPENDECTOMY    . MOUTH SURGERY    . NASAL SINUS SURGERY  04/2014  . TONSILLECTOMY    . TOTAL ABDOMINAL HYSTERECTOMY W/ BILATERAL SALPINGOOPHORECTOMY     wtih oophorectomy    Social History  Substance Use Topics  . Smoking status: Former Research scientist (life sciences)  . Smokeless tobacco: Never Used  . Alcohol use 0.0 oz/week     Comment: occasional     Medication list has been reviewed and updated.   Physical Exam  Constitutional: She is oriented to person, place, and time. She appears well-developed. She has a sickly appearance. No distress.  HENT:  Head: Normocephalic and atraumatic.  Cardiovascular: Normal rate, regular rhythm and normal heart sounds.  Pulmonary/Chest: Effort normal and breath sounds normal. No respiratory distress.  Abdominal: There is tenderness in the suprapubic area. There is no rigidity, no guarding and no CVA tenderness.  Musculoskeletal: Normal range of motion.  Neurological: She is alert and oriented to person, place, and time.  Skin: Skin is warm and dry. No rash noted.  Psychiatric: She has a normal mood and affect. Her behavior is normal. Thought  content normal.  Nursing note and vitals reviewed.   BP 124/72   Pulse 68   Ht 5\' 5"  (1.651 m)   Wt 206 lb (93.4 kg)   BMI 34.28 kg/m   Assessment and Plan: 1. Acute cystitis with hematuria Increase fluids; take Cipro Will advise if medication is needed when culture returns - POC urinalysis w microscopic (non auto) - Urine culture - ciprofloxacin (CIPRO) 500 MG tablet; Take 1 tablet (500 mg total) by mouth 2 (two) times daily.  Dispense: 14 tablet; Refill: 0   Halina Maidens, MD [facility]  12/06/2015

## 2015-12-09 LAB — URINE CULTURE

## 2015-12-11 ENCOUNTER — Telehealth: Payer: Self-pay | Admitting: Internal Medicine

## 2015-12-11 NOTE — Telephone Encounter (Signed)
Urine culture from 11/7 is still not back.  Please call [facility] and find out where it is.

## 2016-01-19 ENCOUNTER — Other Ambulatory Visit: Payer: Self-pay | Admitting: Internal Medicine

## 2016-01-19 DIAGNOSIS — T148XXA Other injury of unspecified body region, initial encounter: Secondary | ICD-10-CM

## 2016-02-21 ENCOUNTER — Ambulatory Visit: Payer: BC Managed Care – PPO | Admitting: Internal Medicine

## 2016-02-22 ENCOUNTER — Ambulatory Visit (INDEPENDENT_AMBULATORY_CARE_PROVIDER_SITE_OTHER): Payer: BC Managed Care – PPO | Admitting: Internal Medicine

## 2016-02-22 ENCOUNTER — Encounter: Payer: Self-pay | Admitting: Internal Medicine

## 2016-02-22 VITALS — BP 118/68 | HR 89 | Temp 97.8°F | Ht 65.0 in | Wt 205.0 lb

## 2016-02-22 DIAGNOSIS — J111 Influenza due to unidentified influenza virus with other respiratory manifestations: Secondary | ICD-10-CM

## 2016-02-22 DIAGNOSIS — J40 Bronchitis, not specified as acute or chronic: Secondary | ICD-10-CM | POA: Diagnosis not present

## 2016-02-22 MED ORDER — AZITHROMYCIN 250 MG PO TABS: ORAL_TABLET | ORAL | 0 refills | Status: DC

## 2016-02-22 MED ORDER — OSELTAMIVIR PHOSPHATE 75 MG PO CAPS: 75.0000 mg | ORAL_CAPSULE | Freq: Two times a day (BID) | ORAL | 0 refills | Status: DC

## 2016-02-22 NOTE — Progress Notes (Signed)
Date:  02/22/2016   Name:  Jade Harris   DOB:  1965-09-29   MRN:  [id number]   Chief Complaint: Fatigue (pt stated having chest tightness and fatigue) URI   This is a new problem. The current episode started yesterday. The problem has been gradually worsening. There has been no fever. Associated symptoms include congestion and coughing. Pertinent negatives include no chest pain, diarrhea, headaches, nausea, vomiting or wheezing. She has tried acetaminophen for the symptoms. The treatment provided mild relief.  Both grandson's diagnosed with flu today.  She has chest heaviness and cough productive of grey phlegm.    Review of Systems  Constitutional: Positive for fatigue. Negative for chills and fever.  HENT: Positive for congestion.   Respiratory: Positive for cough and chest tightness. Negative for wheezing.   Cardiovascular: Negative for chest pain and palpitations.  Gastrointestinal: Negative for diarrhea, nausea and vomiting.  Musculoskeletal: Positive for myalgias. Negative for arthralgias.  Neurological: Negative for dizziness and headaches.    Patient Active Problem List   Diagnosis Date Noted  . Headache, chronic daily 09/20/2015  . Dyslipidemia 07/04/2014  . Local edema 07/04/2014  . Depression, major, single episode, in partial remission ([place]) 07/04/2014  . Menopause 07/04/2014  . 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  . Idiopathic insomnia 07/04/2014    Prior to Admission medications   Medication Sig Start Date End Date Taking? Authorizing Provider  EPINEPHrine 0.3 mg/0.3 mL IJ SOAJ injection Inject as directed.   Yes Historical Provider, MD  estradiol (ESTRACE) 1 MG tablet TAKE 1 TABLET BY MOUTH DAILY 06/03/15  Yes Glean Hess, MD  fluticasone [facility]) 50 MCG/ACT nasal spray Place 2 sprays into the nose daily as needed. Reported on 03/30/2015 02/11/14  Yes Historical Provider, MD  methocarbamol (ROBAXIN) 500 MG tablet TAKE 1 TABLET(500 MG) BY  MOUTH THREE TIMES DAILY AS NEEDED 01/20/16  Yes Glean Hess, MD  PARoxetine (PAXIL) 30 MG tablet Take 1 tablet (30 mg total) by mouth daily. 09/20/15  Yes Glean Hess, MD  topiramate (TOPAMAX) 25 MG tablet Take 3 tablets (75 mg total) by mouth at bedtime. Patient taking differently: Take 100 mg by mouth at bedtime.  09/20/15  Yes Glean Hess, MD  triamterene-hydrochlorothiazide (MAXZIDE-25) 37.5-25 MG tablet Take 1 tablet by mouth daily. 11/18/15  Yes Glean Hess, MD    Allergies  Allergen Reactions  . Sulfa Antibiotics Other (See Comments)    Past Surgical History:  Procedure Laterality Date  . APPENDECTOMY    . MOUTH SURGERY    . NASAL SINUS SURGERY  04/2014  . TONSILLECTOMY    . TOTAL ABDOMINAL HYSTERECTOMY W/ BILATERAL SALPINGOOPHORECTOMY     wtih oophorectomy    Social History  Substance Use Topics  . Smoking status: Former Research scientist (life sciences)  . Smokeless tobacco: Never Used  . Alcohol use 0.0 oz/week     Comment: occasional     Medication list has been reviewed and updated.   Physical Exam  Constitutional: She is oriented to person, place, and time. She appears well-developed. No distress.  HENT:  Head: Normocephalic and atraumatic.  Neck: Normal range of motion. Neck supple.  Cardiovascular: Normal rate, regular rhythm and normal heart sounds.   Pulmonary/Chest: Effort normal. No respiratory distress. She has decreased breath sounds. She has no wheezes. She has no rhonchi.  Musculoskeletal: Normal range of motion.  Neurological: She is alert and oriented to person, place, and time.  Skin: Skin is warm and dry.  No rash noted.  Psychiatric: She has a normal mood and affect. Her behavior is normal. Thought content normal.  Nursing note and vitals reviewed.   BP 118/68   Pulse 89   Temp 97.8 F (36.6 C)   Ht 5\' 5"  (1.651 m)   Wt 205 lb (93 kg)   SpO2 96%   BMI 34.11 kg/m   Assessment and Plan: 1. Bronchitis Continue otc cough suppressants -  azithromycin (ZITHROMAX Z-PAK) 250 MG tablet; UAD  Dispense: 6 each; Refill: 0  2. Influenza - oseltamivir (TAMIFLU) 75 MG capsule; Take 1 capsule (75 mg total) by mouth 2 (two) times daily.  Dispense: 10 capsule; Refill: 0   Halina Maidens, MD [facility]  02/22/2016

## 2016-03-13 ENCOUNTER — Ambulatory Visit (INDEPENDENT_AMBULATORY_CARE_PROVIDER_SITE_OTHER): Payer: BC Managed Care – PPO | Admitting: Internal Medicine

## 2016-03-13 ENCOUNTER — Other Ambulatory Visit: Payer: Self-pay | Admitting: Internal Medicine

## 2016-03-13 ENCOUNTER — Encounter: Payer: Self-pay | Admitting: Internal Medicine

## 2016-03-13 VITALS — BP 126/86 | HR 79 | Temp 97.6°F | Ht 65.0 in | Wt 207.0 lb

## 2016-03-13 DIAGNOSIS — L308 Other specified dermatitis: Secondary | ICD-10-CM | POA: Diagnosis not present

## 2016-03-13 DIAGNOSIS — Z780 Asymptomatic menopausal state: Secondary | ICD-10-CM | POA: Diagnosis not present

## 2016-03-13 DIAGNOSIS — H6691 Otitis media, unspecified, right ear: Secondary | ICD-10-CM

## 2016-03-13 DIAGNOSIS — R519 Headache, unspecified: Secondary | ICD-10-CM

## 2016-03-13 DIAGNOSIS — F324 Major depressive disorder, single episode, in partial remission: Secondary | ICD-10-CM | POA: Diagnosis not present

## 2016-03-13 DIAGNOSIS — Z1231 Encounter for screening mammogram for malignant neoplasm of breast: Secondary | ICD-10-CM | POA: Diagnosis not present

## 2016-03-13 DIAGNOSIS — R51 Headache: Secondary | ICD-10-CM

## 2016-03-13 DIAGNOSIS — Z1239 Encounter for other screening for malignant neoplasm of breast: Secondary | ICD-10-CM

## 2016-03-13 MED ORDER — AZITHROMYCIN 250 MG PO TABS: ORAL_TABLET | ORAL | 0 refills | Status: DC

## 2016-03-13 MED ORDER — TRIAMCINOLONE ACETONIDE 0.025 % EX OINT: 1.0000 "application " | TOPICAL_OINTMENT | Freq: Two times a day (BID) | CUTANEOUS | 0 refills | Status: DC

## 2016-03-13 MED ORDER — MIRTAZAPINE 30 MG PO TABS: 30.0000 mg | ORAL_TABLET | Freq: Every day | ORAL | 3 refills | Status: DC

## 2016-03-13 NOTE — Progress Notes (Signed)
Date:  03/13/2016   Name:  Jade Harris   DOB:  08-16-1965   MRN:  [id number]   Chief Complaint: Excessive Sweating (Pt state hormones pills are not working anymore.) and Eczema Rash  This is a recurrent problem. The problem has been gradually worsening since onset. The affected locations include the left hand and right hand. Associated symptoms include congestion. Pertinent negatives include no fatigue, fever, shortness of breath or sore throat. Past treatments include topical steroids. The treatment provided significant relief. (Has stopped topamax due to sleep issues, cut out wine and any foods that might have MSG.)  Insomnia  Primary symptoms: sleep disturbance, premature morning awakening.  The symptoms are aggravated by alcohol and medication changes. Typical bedtime:  11-12 P.M..  How long after going to bed to you fall asleep: 30-60 minutes (but only sleeps about 4 hours).    Sinusitis  This is a recurrent problem. The problem is unchanged. There has been no fever. Associated symptoms include congestion, diaphoresis, headaches and sinus pressure. Pertinent negatives include no chills, shortness of breath or sore throat. Treatments tried: took course of Zpak 3 weeks ago.   Menopausal symptoms - she has been on estrace 1 mg for several years.  Now having trouble with temperature regulation.  No night sweats per se.  Having only a few hot flashes in the daytime.  She has not had a mammogram in about 5 years - I have encouraged to get that done.  Mood disorder - more depressed than anxious.  Still taking Paxil.  She is irritable, depressed about her weight.     Review of Systems  Constitutional: Positive for diaphoresis. Negative for chills, fatigue and fever.  HENT: Positive for congestion and sinus pressure. Negative for sore throat.   Respiratory: Negative for chest tightness, shortness of breath and wheezing.   Cardiovascular: Negative for chest pain, palpitations and leg  swelling.  Gastrointestinal: Negative for abdominal pain.  Skin: Positive for rash.  Neurological: Positive for headaches. Negative for dizziness and numbness.  Psychiatric/Behavioral: Positive for dysphoric mood and sleep disturbance. Negative for decreased concentration and hallucinations. The patient is nervous/anxious and has insomnia.     Patient Active Problem List   Diagnosis Date Noted  . Headache, chronic daily 09/20/2015  . Dyslipidemia 07/04/2014  . Local edema 07/04/2014  . Depression, major, single episode, in partial remission ([place]) 07/04/2014  . Menopause 07/04/2014  . 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  . Idiopathic insomnia 07/04/2014    Prior to Admission medications   Medication Sig Start Date End Date Taking? Authorizing Provider  EPINEPHrine 0.3 mg/0.3 mL IJ SOAJ injection Inject as directed.   Yes Historical Provider, MD  estradiol (ESTRACE) 1 MG tablet TAKE 1 TABLET BY MOUTH DAILY 06/03/15  Yes Glean Hess, MD  fluticasone [facility]) 50 MCG/ACT nasal spray Place 2 sprays into the nose daily as needed. Reported on 03/30/2015 02/11/14  Yes Historical Provider, MD  methocarbamol (ROBAXIN) 500 MG tablet TAKE 1 TABLET(500 MG) BY MOUTH THREE TIMES DAILY AS NEEDED 01/20/16  Yes Glean Hess, MD  PARoxetine (PAXIL) 30 MG tablet Take 1 tablet (30 mg total) by mouth daily. 09/20/15  Yes Glean Hess, MD  SUMAtriptan (IMITREX) 100 MG tablet  02/17/16  Yes Historical Provider, MD  triamterene-hydrochlorothiazide (MAXZIDE-25) 37.5-25 MG tablet Take 1 tablet by mouth daily. 11/18/15  Yes Glean Hess, MD    Allergies  Allergen Reactions  . Sulfa Antibiotics Other (See Comments)  Past Surgical History:  Procedure Laterality Date  . APPENDECTOMY    . MOUTH SURGERY    . NASAL SINUS SURGERY  04/2014  . TONSILLECTOMY    . TOTAL ABDOMINAL HYSTERECTOMY W/ BILATERAL SALPINGOOPHORECTOMY     wtih oophorectomy    Social History  Substance Use Topics   . Smoking status: Former Research scientist (life sciences)  . Smokeless tobacco: Never Used  . Alcohol use 0.0 oz/week     Comment: occasional     Medication list has been reviewed and updated.   Physical Exam  Constitutional: She is oriented to person, place, and time. She appears well-developed. No distress.  HENT:  Head: Normocephalic and atraumatic.  Right Ear: Tympanic membrane and ear canal normal.  Left Ear: Tympanic membrane and ear canal normal.  Nose: Right sinus exhibits maxillary sinus tenderness. Left sinus exhibits maxillary sinus tenderness.  Mouth/Throat: No posterior oropharyngeal edema or posterior oropharyngeal erythema.  Cardiovascular: Normal rate, regular rhythm and normal heart sounds.   Pulmonary/Chest: Effort normal and breath sounds normal. No respiratory distress. She has no wheezes.  Musculoskeletal: Normal range of motion.  Neurological: She is alert and oriented to person, place, and time.  Skin: Skin is warm and dry. No rash noted.  Psychiatric: Her speech is normal and behavior is normal. Thought content normal. She exhibits a depressed mood.  Nursing note and vitals reviewed.   BP 126/86   Pulse 79   Temp 97.6 F (36.4 C)   Ht 5\' 5"  (1.651 m)   Wt 207 lb (93.9 kg)   SpO2 99%   BMI 34.45 kg/m   Assessment and Plan: 1. Depression, major, single episode, in partial remission ([place]) Will add remeron to help with sleep and mood - mirtazapine (REMERON) 30 MG tablet; Take 1 tablet (30 mg total) by mouth at bedtime.  Dispense: 30 tablet; Refill: 3  2. Otitis of right ear - azithromycin (ZITHROMAX Z-PAK) 250 MG tablet; UAD  Dispense: 6 each; Refill: 0  3. Other eczema - triamcinolone (KENALOG) 0.025 % ointment; Apply 1 application topically 2 (two) times daily.  Dispense: 30 g; Refill: 0  4. Breast cancer screening Will schedule mammogram at [facility]  5. Menopause Continue same dose of estrace  6. Headache, chronic daily No longer on topamax preventative  Halina Maidens, MD [facility]  03/13/2016

## 2016-06-04 ENCOUNTER — Ambulatory Visit: Payer: BC Managed Care – PPO | Admitting: Internal Medicine

## 2016-06-10 ENCOUNTER — Other Ambulatory Visit: Payer: Self-pay | Admitting: Internal Medicine

## 2016-06-11 ENCOUNTER — Ambulatory Visit (INDEPENDENT_AMBULATORY_CARE_PROVIDER_SITE_OTHER): Payer: BC Managed Care – PPO | Admitting: Internal Medicine

## 2016-06-11 ENCOUNTER — Encounter: Payer: Self-pay | Admitting: Internal Medicine

## 2016-06-11 VITALS — BP 114/80 | HR 64 | Ht 65.0 in | Wt 211.0 lb

## 2016-06-11 DIAGNOSIS — Z780 Asymptomatic menopausal state: Secondary | ICD-10-CM

## 2016-06-11 DIAGNOSIS — R600 Localized edema: Secondary | ICD-10-CM

## 2016-06-11 MED ORDER — TRIAMTERENE-HCTZ 37.5-25 MG PO TABS: 1.5000 | ORAL_TABLET | Freq: Every day | ORAL | 5 refills | Status: DC

## 2016-06-11 NOTE — Patient Instructions (Signed)
Cut Estrace in half  Add an additional half of Triamterene-hydrochlorothiazide

## 2016-06-11 NOTE — Progress Notes (Signed)
Date:  06/11/2016   Name:  Jade Harris   DOB:  1965-12-07   MRN:  [id number]   Chief Complaint: Edema (Lt foot and ankle swelling. Noticed 2 weeks ago. - Hurting around ankle when walks. Both feet normally swell together but its just the Left ankle. ) Ankle Pain   The incident occurred more than 1 week ago. There was no injury mechanism. The pain is present in the left ankle. Quality: swelling.  Jade Harris has had swelling off an on for years.  Started after beginning HRT.  Put on diuretic which Jade Harris takes as needed.  Recently more edema on the left and more uncomfortable.  It improved some since taking diuretic every day.  Also drinking more plain water under the direction of a nutritionist.    Review of Systems  Constitutional: Negative for chills, fatigue and fever.  Respiratory: Negative for cough, chest tightness, shortness of breath and wheezing.   Cardiovascular: Positive for leg swelling. Negative for chest pain and palpitations.  Gastrointestinal: Negative for abdominal pain.  Genitourinary: Negative for dysuria and frequency.  Musculoskeletal: Positive for arthralgias (in left lateral ankle). Negative for gait problem.  Psychiatric/Behavioral: Negative for sleep disturbance.    Patient Active Problem List   Diagnosis Date Noted  . Headache, chronic daily 09/20/2015  . Dyslipidemia 07/04/2014  . Local edema 07/04/2014  . Depression, major, single episode, in partial remission ([place]) 07/04/2014  . Menopause 07/04/2014  . 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  . Idiopathic insomnia 07/04/2014    Prior to Admission medications   Medication Sig Start Date End Date Taking? Authorizing Provider  EPINEPHrine 0.3 mg/0.3 mL IJ SOAJ injection Inject as directed.   Yes [provider]  estradiol (ESTRACE) 1 MG tablet TAKE 1 TABLET BY MOUTH DAILY 06/10/16  Yes Glean Hess, MD  fluticasone [facility]) 50 MCG/ACT nasal spray Place 2 sprays into the nose daily  as needed. Reported on 03/30/2015 02/11/14  Yes [provider]  methocarbamol (ROBAXIN) 500 MG tablet TAKE 1 TABLET(500 MG) BY MOUTH THREE TIMES DAILY AS NEEDED 01/20/16  Yes Glean Hess, MD  mirtazapine (REMERON) 30 MG tablet Take 1 tablet (30 mg total) by mouth at bedtime. 03/13/16  Yes Glean Hess, MD  PARoxetine (PAXIL) 30 MG tablet Take 1 tablet (30 mg total) by mouth daily. 09/20/15  Yes Glean Hess, MD  SUMAtriptan (IMITREX) 100 MG tablet  02/17/16  Yes [provider]  triamcinolone (KENALOG) 0.025 % ointment Apply 1 application topically 2 (two) times daily. 03/13/16  Yes Glean Hess, MD  triamterene-hydrochlorothiazide (MAXZIDE-25) 37.5-25 MG tablet Take 1 tablet by mouth daily. 11/18/15  Yes Glean Hess, MD    Allergies  Allergen Reactions  . Sulfa Antibiotics Other (See Comments)    Past Surgical History:  Procedure Laterality Date  . APPENDECTOMY    . MOUTH SURGERY    . NASAL SINUS SURGERY  04/2014  . TONSILLECTOMY    . TOTAL ABDOMINAL HYSTERECTOMY W/ BILATERAL SALPINGOOPHORECTOMY     wtih oophorectomy    Social History  Substance Use Topics  . Smoking status: Former Research scientist (life sciences)  . Smokeless tobacco: Never Used  . Alcohol use 0.0 oz/week     Comment: occasional     Medication list has been reviewed and updated.   Physical Exam  Constitutional: Jade Harris is oriented to person, place, and time. Jade Harris appears well-developed. No distress.  HENT:  Head: Normocephalic and atraumatic.  Neck: Normal range of  motion. Neck supple.  Cardiovascular: Normal rate, regular rhythm, normal heart sounds and intact distal pulses.   Pulmonary/Chest: Effort normal and breath sounds normal. No respiratory distress. Jade Harris has no wheezes.  Musculoskeletal: Normal range of motion. Jade Harris exhibits edema (2+ to knee on left; 1+ to knee on right).  Neurological: Jade Harris is alert and oriented to person, place, and time. Jade Harris has normal strength. No cranial nerve deficit  or sensory deficit.  Skin: Skin is warm and dry. No rash noted.  Psychiatric: Jade Harris has a normal mood and affect. Her behavior is normal. Thought content normal.  Nursing note and vitals reviewed.   BP 114/80 (BP Location: Right Arm, Patient Position: Sitting, Cuff Size: Large)   Pulse 64   Ht 5\' 5"  (1.651 m)   Wt 211 lb (95.7 kg)   SpO2 98%   BMI 35.11 kg/m   Assessment and Plan: 1. Local edema Increase daily diuretic to 1.5 daily Elevate when able Continue free water intake  2. Menopause Reduce dose of estrace to 0.5 mg daily   Meds ordered this encounter  Medications  . triamterene-hydrochlorothiazide (MAXZIDE-25) 37.5-25 MG tablet    Sig: Take 1.5 tablets by mouth daily.    Dispense:  45 tablet    Refill:  [place], MD [facility]  06/11/2016

## 2016-06-12 ENCOUNTER — Ambulatory Visit: Payer: BC Managed Care – PPO | Admitting: Internal Medicine

## 2016-07-11 ENCOUNTER — Other Ambulatory Visit: Payer: Self-pay | Admitting: Internal Medicine

## 2016-08-06 ENCOUNTER — Ambulatory Visit (INDEPENDENT_AMBULATORY_CARE_PROVIDER_SITE_OTHER): Payer: BC Managed Care – PPO | Admitting: Internal Medicine

## 2016-08-06 ENCOUNTER — Encounter: Payer: Self-pay | Admitting: Internal Medicine

## 2016-08-06 VITALS — BP 120/72 | HR 95 | Temp 97.9°F | Ht 65.0 in | Wt 199.2 lb

## 2016-08-06 DIAGNOSIS — J019 Acute sinusitis, unspecified: Secondary | ICD-10-CM | POA: Diagnosis not present

## 2016-08-06 MED ORDER — AZITHROMYCIN 250 MG PO TABS: ORAL_TABLET | ORAL | 0 refills | Status: DC

## 2016-08-06 NOTE — Patient Instructions (Signed)
Sudafed 30 mg twice a day  Tylenol on a schedule (every 6-8 hours) for sore throat  Mucinex if it helps

## 2016-08-06 NOTE — Progress Notes (Signed)
Date:  08/06/2016   Name:  Jade Harris   DOB:  11-18-1965   MRN:  [id number]   Chief Complaint: Sore Throat (Drainage with sore throat. Started lastnight. Fatigued. - Hurts to touch throat- feels scratchy. Both ears are clogged and hurting. ) Sore Throat   This is a new problem. The current episode started in the past 7 days. The problem has been unchanged. There has been no fever. Associated symptoms include coughing, ear pain, a plugged ear sensation and swollen glands. Pertinent negatives include no abdominal pain, shortness of breath or vomiting.     Review of Systems  Constitutional: Positive for fatigue. Negative for chills and fever.  HENT: Positive for ear pain and sore throat.   Eyes: Negative for visual disturbance.  Respiratory: Positive for cough. Negative for chest tightness and shortness of breath.   Cardiovascular: Negative for chest pain.  Gastrointestinal: Negative for abdominal pain, nausea and vomiting.    Patient Active Problem List   Diagnosis Date Noted  . Headache, chronic daily 09/20/2015  . Dyslipidemia 07/04/2014  . Local edema 07/04/2014  . Depression, major, single episode, in partial remission ([place]) 07/04/2014  . Menopause 07/04/2014  . 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  . Idiopathic insomnia 07/04/2014    Prior to Admission medications   Medication Sig Start Date End Date Taking? Authorizing Provider  EPINEPHrine 0.3 mg/0.3 mL IJ SOAJ injection Inject as directed.   Yes [provider]  estradiol (ESTRACE) 1 MG tablet TAKE 1 TABLET BY MOUTH DAILY 07/12/16  Yes Glean Hess, MD  fluticasone [facility]) 50 MCG/ACT nasal spray Place 2 sprays into the nose daily as needed. Reported on 03/30/2015 02/11/14  Yes [provider]  methocarbamol (ROBAXIN) 500 MG tablet TAKE 1 TABLET(500 MG) BY MOUTH THREE TIMES DAILY AS NEEDED 01/20/16  Yes Glean Hess, MD  mirtazapine (REMERON) 30 MG tablet Take 1 tablet (30 mg  total) by mouth at bedtime. 03/13/16  Yes Glean Hess, MD  PARoxetine (PAXIL) 30 MG tablet Take 1 tablet (30 mg total) by mouth daily. 09/20/15  Yes Glean Hess, MD  SUMAtriptan (IMITREX) 100 MG tablet  02/17/16  Yes [provider]  Topiramate ER (TROKENDI XR) 100 MG CP24 Take 100 mg by mouth as needed. 12/29/15  Yes [provider]  triamcinolone (KENALOG) 0.025 % ointment Apply 1 application topically 2 (two) times daily. 03/13/16  Yes Glean Hess, MD  triamterene-hydrochlorothiazide (MAXZIDE-25) 37.5-25 MG tablet Take 1.5 tablets by mouth daily. 06/11/16  Yes Glean Hess, MD  phentermine (ADIPEX-P) 37.5 MG tablet Take 37.5 mg by mouth every other day. 07/25/16   [provider]    Allergies  Allergen Reactions  . Sulfa Antibiotics Other (See Comments)    Past Surgical History:  Procedure Laterality Date  . APPENDECTOMY    . MOUTH SURGERY    . NASAL SINUS SURGERY  04/2014  . TONSILLECTOMY    . TOTAL ABDOMINAL HYSTERECTOMY W/ BILATERAL SALPINGOOPHORECTOMY     wtih oophorectomy    Social History  Substance Use Topics  . Smoking status: Former Research scientist (life sciences)  . Smokeless tobacco: Never Used  . Alcohol use 0.0 oz/week     Comment: occasional     Medication list has been reviewed and updated.   Physical Exam  Constitutional: She is oriented to person, place, and time. She appears well-developed and well-nourished.  HENT:  Right Ear: External ear and ear canal normal. Tympanic membrane is scarred.  Tympanic membrane is not erythematous and not retracted.  Left Ear: Tympanic membrane, external ear and ear canal normal. Tympanic membrane is not erythematous and not retracted.  Mouth/Throat: Uvula is midline and mucous membranes are normal. No oral lesions. Posterior oropharyngeal erythema present. No oropharyngeal exudate.  Cardiovascular: Normal rate, regular rhythm and normal heart sounds.   Pulmonary/Chest: Effort normal and breath sounds  normal. She has no wheezes. She has no rales.  Lymphadenopathy:    She has no cervical adenopathy.  Neurological: She is alert and oriented to person, place, and time.    BP 120/72   Pulse 95   Temp 97.9 F (36.6 C)   Ht 5\' 5"  (1.651 m)   Wt 199 lb 3.2 oz (90.4 kg)   SpO2 100%   BMI 33.15 kg/m   Assessment and Plan: 1. Acute sinusitis, recurrence not specified, unspecified location Continue sudafed and tylenol - azithromycin (ZITHROMAX Z-PAK) 250 MG tablet; UAD  Dispense: 6 each; Refill: 0   Meds ordered this encounter  Medications  . azithromycin (ZITHROMAX Z-PAK) 250 MG tablet    Sig: UAD    Dispense:  6 each    Refill:  0    Halina Maidens, MD [facility]  08/06/2016

## 2016-10-29 DIAGNOSIS — I10 Essential (primary) hypertension: Secondary | ICD-10-CM | POA: Insufficient documentation

## 2017-01-01 ENCOUNTER — Ambulatory Visit: Payer: BC Managed Care – PPO | Admitting: Internal Medicine

## 2017-01-01 ENCOUNTER — Encounter: Payer: Self-pay | Admitting: Internal Medicine

## 2017-01-01 VITALS — BP 122/68 | HR 89 | Temp 97.9°F | Ht 65.0 in | Wt 195.0 lb

## 2017-01-01 DIAGNOSIS — Z23 Encounter for immunization: Secondary | ICD-10-CM

## 2017-01-01 DIAGNOSIS — L308 Other specified dermatitis: Secondary | ICD-10-CM

## 2017-01-01 DIAGNOSIS — F324 Major depressive disorder, single episode, in partial remission: Secondary | ICD-10-CM | POA: Diagnosis not present

## 2017-01-01 MED ORDER — TRIAMCINOLONE ACETONIDE 0.1 % EX CREA: 1.0000 "application " | TOPICAL_CREAM | Freq: Two times a day (BID) | CUTANEOUS | 0 refills | Status: DC

## 2017-01-01 MED ORDER — FLUTICASONE PROPIONATE 50 MCG/ACT NA SUSP: 2.0000 | Freq: Every day | NASAL | 5 refills | Status: DC

## 2017-01-01 NOTE — Patient Instructions (Signed)
Apply cream twice a day to affected areas.  Wear white cotton gloves at night after applying cream.

## 2017-01-01 NOTE — Progress Notes (Signed)
Date:  01/01/2017   Name:  Jade Harris   DOB:  October 30, 1965   MRN:  [id number]   Chief Complaint: Rash (Outbreak of rash on hands- been about a month. Using cream she is already prescribed. Is not helping. Both hands but worse on right hand.)  Rash  This is a recurrent problem. The problem has been gradually worsening since onset. The affected locations include the right hand and left hand. Pertinent negatives include no fatigue, fever or shortness of breath.  She has been stressed with the death of her mother in Dec 01, 2022.  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  She stopped Paxil last year.  She is wondering if she needs it again.  She has been slightly more irritable lately.     Review of Systems  Constitutional: Negative for chills, fatigue and fever.  Respiratory: Negative for choking, chest tightness and shortness of breath.   Cardiovascular: Negative for chest pain and palpitations.  Skin: Positive for rash. Negative for color change.  Psychiatric/Behavioral: Positive for dysphoric mood. Negative for sleep disturbance.    Patient Active Problem List   Diagnosis Date Noted  . Headache, chronic daily 09/20/2015  . Dyslipidemia 07/04/2014  . Local edema 07/04/2014  . Depression, major, single episode, in partial remission ([place]) 07/04/2014  . Menopause 07/04/2014  . 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  . Idiopathic insomnia 07/04/2014    Prior to Admission medications   Medication Sig Start Date End Date Taking? Authorizing Provider  EPINEPHrine 0.3 mg/0.3 mL IJ SOAJ injection Inject as directed.   Yes [provider]  estradiol (ESTRACE) 1 MG tablet TAKE 1 TABLET BY MOUTH DAILY 07/12/16  Yes Glean Hess, MD  fluticasone [facility]) 50 MCG/ACT nasal spray Place 2 sprays into the nose daily as needed. Reported on 03/30/2015 02/11/14  Yes [provider]  methocarbamol (ROBAXIN) 500 MG tablet TAKE 1 TABLET(500 MG)  BY MOUTH THREE TIMES DAILY AS NEEDED 01/20/16  Yes Glean Hess, MD  mirtazapine (REMERON) 30 MG tablet Take 1 tablet (30 mg total) by mouth at bedtime. 03/13/16  Yes Glean Hess, MD  PARoxetine (PAXIL) 30 MG tablet Take 1 tablet (30 mg total) by mouth daily. 09/20/15  Yes Glean Hess, MD  SUMAtriptan (IMITREX) 100 MG tablet  02/17/16  Yes [provider]  triamcinolone (KENALOG) 0.025 % ointment Apply 1 application topically 2 (two) times daily. 03/13/16  Yes Glean Hess, MD  triamterene-hydrochlorothiazide (MAXZIDE-25) 37.5-25 MG tablet Take 1.5 tablets by mouth daily. 06/11/16  Yes Glean Hess, MD  Topiramate ER (TROKENDI XR) 100 MG CP24 Take 100 mg by mouth as needed. 12/29/15   [provider]    Allergies  Allergen Reactions  . Sulfa Antibiotics Other (See Comments)    Past Surgical History:  Procedure Laterality Date  . APPENDECTOMY    . MOUTH SURGERY    . NASAL SINUS SURGERY  04/2014  . TONSILLECTOMY    . TOTAL ABDOMINAL HYSTERECTOMY W/ BILATERAL SALPINGOOPHORECTOMY     wtih oophorectomy    Social History   Tobacco Use  . Smoking status: Former Research scientist (life sciences)  . Smokeless tobacco: Never Used  Substance Use Topics  . Alcohol use: Yes    Alcohol/week: 0.0 oz    Comment: occasional  . Drug use: No     Medication list has been reviewed and updated.  PHQ 2/9 Scores 01/01/2017 09/20/2015 01/27/2015  PHQ - 2 Score  0 0 0  PHQ- 9 Score 0 - -    Physical Exam  Constitutional: She is oriented to person, place, and time. She appears well-developed. No distress.  HENT:  Head: Normocephalic and atraumatic.  Neck: Normal range of motion. Neck supple.  Cardiovascular: Normal rate, regular rhythm and normal heart sounds.  Pulmonary/Chest: Effort normal and breath sounds normal. No respiratory distress. She has no wheezes.  Musculoskeletal: Normal range of motion. She exhibits edema.  Neurological: She is alert and oriented to person, place,  and time.  Skin: Skin is warm and dry. No rash noted.  Peeling, cracking skin both hands, worse on right.  Tender in areas.  No evidence of bacterial infection.  Psychiatric: She has a normal mood and affect. Her behavior is normal. Thought content normal.  Nursing note and vitals reviewed.   BP 122/68   Pulse 89   Temp 97.9 F (36.6 C) (Oral)   Ht 5\' 5"  (1.651 m)   Wt 195 lb (88.5 kg)   SpO2 98%   BMI 32.45 kg/m   Assessment and Plan: 1. Other eczema Increase strength of topical - triamcinolone cream (KENALOG) 0.1 %; Apply 1 application topically 2 (two) times daily.  Dispense: 30 g; Refill: 0  2. Need for influenza vaccination - Flu Vaccine QUAD 36+ mos IM  3. Depression, major, single episode, in partial remission ([place]) Call for Paxil refill if needed   Meds ordered this encounter  Medications  . triamcinolone cream (KENALOG) 0.1 %    Sig: Apply 1 application topically 2 (two) times daily.    Dispense:  30 g    Refill:  0  . fluticasone (FLONASE) 50 MCG/ACT nasal spray    Sig: Place 2 sprays into both nostrils daily. Reported on 03/30/2015    Dispense:  16 g    Refill:  5    Partially dictated using Editor, commissioning. Any errors are unintentional.  Halina Maidens, MD [facility]  01/01/2017

## 2017-03-08 ENCOUNTER — Encounter: Payer: Self-pay | Admitting: Internal Medicine

## 2017-03-08 ENCOUNTER — Ambulatory Visit: Payer: BC Managed Care – PPO | Admitting: Internal Medicine

## 2017-03-08 VITALS — BP 130/82 | HR 84 | Temp 97.9°F | Ht 65.0 in | Wt 199.0 lb

## 2017-03-08 DIAGNOSIS — J019 Acute sinusitis, unspecified: Secondary | ICD-10-CM | POA: Diagnosis not present

## 2017-03-08 MED ORDER — AMOXICILLIN-POT CLAVULANATE 875-125 MG PO TABS: 1.0000 | ORAL_TABLET | Freq: Two times a day (BID) | ORAL | 0 refills | Status: AC

## 2017-03-08 NOTE — Progress Notes (Signed)
Date:  03/08/2017   Name:  Jade Harris   DOB:  1965/10/11   MRN:  [id number]   Chief Complaint: Sinusitis (Started Tuesday. Getting worse. Headache, Sinus vision, aching, not much of a cough. Somestimes feels feverish but no thermometer at home. )  Sinusitis  This is a new problem. The current episode started in the past 7 days. The problem has been gradually worsening since onset. There has been no fever. Associated symptoms include headaches, sinus pressure and a sore throat. Pertinent negatives include no chills, coughing or shortness of breath.     Review of Systems  Constitutional: Positive for fatigue. Negative for chills and fever.  HENT: Positive for sinus pressure and sore throat.   Respiratory: Negative for cough, chest tightness, shortness of breath and wheezing.   Cardiovascular: Negative for chest pain and palpitations.  Gastrointestinal: Positive for nausea. Negative for diarrhea and vomiting.  Musculoskeletal: Negative for arthralgias, joint swelling and myalgias.  Neurological: Positive for dizziness and headaches.  Hematological: Negative for adenopathy.    Patient Active Problem List   Diagnosis Date Noted  . Headache, chronic daily 09/20/2015  . Dyslipidemia 07/04/2014  . Local edema 07/04/2014  . Depression, major, single episode, in partial remission ([place]) 07/04/2014  . Menopause 07/04/2014  . 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  . Idiopathic insomnia 07/04/2014    Prior to Admission medications   Medication Sig Start Date End Date Taking? Authorizing Provider  EPINEPHrine 0.3 mg/0.3 mL IJ SOAJ injection Inject as directed.   Yes [provider]  estradiol (ESTRACE) 1 MG tablet TAKE 1 TABLET BY MOUTH DAILY 07/12/16  Yes Glean Hess, MD  fluticasone [facility]) 50 MCG/ACT nasal spray Place 2 sprays into both nostrils daily. Reported on 03/30/2015 01/01/17  Yes Glean Hess, MD  methocarbamol (ROBAXIN) 500 MG tablet TAKE 1  TABLET(500 MG) BY MOUTH THREE TIMES DAILY AS NEEDED 01/20/16  Yes Glean Hess, MD  mirtazapine (REMERON) 30 MG tablet Take 1 tablet (30 mg total) by mouth at bedtime. 03/13/16  Yes Glean Hess, MD  SUMAtriptan (IMITREX) 100 MG tablet  02/17/16  Yes [provider]  Topiramate ER (TROKENDI XR) 100 MG CP24 Take 100 mg by mouth as needed. 12/29/15  Yes [provider]  triamcinolone cream (KENALOG) 0.1 % Apply 1 application topically 2 (two) times daily. 01/01/17  Yes Glean Hess, MD  triamterene-hydrochlorothiazide (MAXZIDE-25) 37.5-25 MG tablet Take 1.5 tablets by mouth daily. 06/11/16  Yes Glean Hess, MD    Allergies  Allergen Reactions  . Sulfa Antibiotics Other (See Comments)    Past Surgical History:  Procedure Laterality Date  . APPENDECTOMY    . MOUTH SURGERY    . NASAL SINUS SURGERY  04/2014  . TONSILLECTOMY    . TOTAL ABDOMINAL HYSTERECTOMY W/ BILATERAL SALPINGOOPHORECTOMY     wtih oophorectomy    Social History   Tobacco Use  . Smoking status: Former Research scientist (life sciences)  . Smokeless tobacco: Never Used  Substance Use Topics  . Alcohol use: Yes    Alcohol/week: 0.0 oz    Comment: occasional  . Drug use: No     Medication list has been reviewed and updated.  PHQ 2/9 Scores 01/01/2017 09/20/2015 01/27/2015  PHQ - 2 Score 0 0 0  PHQ- 9 Score 0 - -    Physical Exam  Constitutional: She is oriented to person, place, and time. She appears well-developed. She has a sickly appearance. No distress.  HENT:  Head: Normocephalic and atraumatic.  Right Ear: Ear canal normal. Tympanic membrane is retracted.  Left Ear: Tympanic membrane and ear canal normal.  Nose: Right sinus exhibits maxillary sinus tenderness and frontal sinus tenderness. Left sinus exhibits maxillary sinus tenderness and frontal sinus tenderness.  Mouth/Throat: Oropharyngeal exudate and posterior oropharyngeal erythema present.  Cardiovascular: Normal rate and regular rhythm.    Pulmonary/Chest: Effort normal. No respiratory distress. She has no decreased breath sounds. She has no wheezes.  Musculoskeletal: Normal range of motion.  Neurological: She is alert and oriented to person, place, and time.  Skin: Skin is warm and dry. No rash noted.  Psychiatric: She has a normal mood and affect. Her behavior is normal. Thought content normal.  Nursing note and vitals reviewed.   BP 130/82   Pulse 84   Temp 97.9 F (36.6 C) (Oral)   Ht 5\' 5"  (1.651 m)   Wt 199 lb (90.3 kg)   SpO2 99%   BMI 33.12 kg/m   Assessment and Plan: 1. Acute sinusitis, recurrence not specified, unspecified location Continue Flonase, nasal rinse and sudafed - amoxicillin-clavulanate (AUGMENTIN) 875-125 MG tablet; Take 1 tablet by mouth 2 (two) times daily for 10 days.  Dispense: 20 tablet; Refill: 0   Meds ordered this encounter  Medications  . amoxicillin-clavulanate (AUGMENTIN) 875-125 MG tablet    Sig: Take 1 tablet by mouth 2 (two) times daily for 10 days.    Dispense:  20 tablet    Refill:  0    Partially dictated using Editor, commissioning. Any errors are unintentional.  Halina Maidens, MD [facility]  03/08/2017

## 2017-03-22 ENCOUNTER — Ambulatory Visit: Payer: BC Managed Care – PPO | Admitting: Internal Medicine

## 2017-03-22 ENCOUNTER — Encounter: Payer: Self-pay | Admitting: Internal Medicine

## 2017-03-22 VITALS — BP 104/76 | HR 95 | Temp 97.8°F | Ht 65.0 in | Wt 199.0 lb

## 2017-03-22 DIAGNOSIS — J019 Acute sinusitis, unspecified: Secondary | ICD-10-CM

## 2017-03-22 DIAGNOSIS — B373 Candidiasis of vulva and vagina: Secondary | ICD-10-CM

## 2017-03-22 DIAGNOSIS — B3731 Acute candidiasis of vulva and vagina: Secondary | ICD-10-CM

## 2017-03-22 MED ORDER — FLUCONAZOLE 100 MG PO TABS
100.0000 mg | ORAL_TABLET | Freq: Every day | ORAL | 0 refills | Status: DC
Start: 2017-03-22 — End: 2017-08-23

## 2017-03-22 MED ORDER — AZITHROMYCIN 250 MG PO TABS: ORAL_TABLET | ORAL | 0 refills | Status: AC

## 2017-03-22 NOTE — Patient Instructions (Signed)
Zyrtec, Claritin or Allegra -

## 2017-03-22 NOTE — Progress Notes (Signed)
Date:  03/22/2017   Name:  Jade Harris   DOB:  [birth year]   MRN:  [id number]   Chief Complaint: chest congestion (Fatigued, Chest congestion. Drainage in throat. Was just seen 2 weeks ago for sinus. Now in chest and throat.  )  Cough  This is a recurrent problem. The cough is productive of sputum. Associated symptoms include nasal congestion, postnasal drip and a sore throat. Pertinent negatives include no chest pain, chills or fever.  She recently completed augmentin for sinus infection and also has yeast vaginitis.  She has a lot of PND and scratchy throat as well.   Review of Systems  Constitutional: Negative for chills and fever.  HENT: Positive for postnasal drip, sore throat and trouble swallowing.   Eyes: Negative for visual disturbance.  Respiratory: Positive for cough.   Cardiovascular: Negative for chest pain and palpitations.  Gastrointestinal: Negative for diarrhea and vomiting.  Genitourinary: Positive for vaginal discharge.    Patient Active Problem List   Diagnosis Date Noted  . Headache, chronic daily 09/20/2015  . Dyslipidemia 07/04/2014  . Local edema 07/04/2014  . Depression, major, single episode, in partial remission ([place]) 07/04/2014  . Menopause 07/04/2014  . 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  . Idiopathic insomnia 07/04/2014    Prior to Admission medications   Medication Sig Start Date End Date Taking? Authorizing Provider  EPINEPHrine 0.3 mg/0.3 mL IJ SOAJ injection Inject as directed.   Yes [provider]  estradiol (ESTRACE) 1 MG tablet TAKE 1 TABLET BY MOUTH DAILY 07/12/16  Yes Jade Hess, MD  fluticasone [facility]) 50 MCG/ACT nasal spray Place 2 sprays into both nostrils daily. Reported on 03/30/2015 01/01/17  Yes Jade Hess, MD  methocarbamol (ROBAXIN) 500 MG tablet TAKE 1 TABLET(500 MG) BY MOUTH THREE TIMES DAILY AS NEEDED 01/20/16  Yes Jade Hess, MD  mirtazapine (REMERON) 30 MG tablet Take 1  tablet (30 mg total) by mouth at bedtime. 03/13/16  Yes Jade Hess, MD  SUMAtriptan (IMITREX) 100 MG tablet  02/17/16  Yes [provider]  Topiramate ER (TROKENDI XR) 100 MG CP24 Take 100 mg by mouth as needed. 12/29/15  Yes [provider]  triamcinolone cream (KENALOG) 0.1 % Apply 1 application topically 2 (two) times daily. 01/01/17  Yes Jade Hess, MD  triamterene-hydrochlorothiazide (MAXZIDE-25) 37.5-25 MG tablet Take 1.5 tablets by mouth daily. 06/11/16  Yes Jade Hess, MD    Allergies  Allergen Reactions  . Sulfa Antibiotics Other (See Comments)    Past Surgical History:  Procedure Laterality Date  . APPENDECTOMY    . MOUTH SURGERY    . NASAL SINUS SURGERY  04/2014  . TONSILLECTOMY    . TOTAL ABDOMINAL HYSTERECTOMY W/ BILATERAL SALPINGOOPHORECTOMY     wtih oophorectomy    Social History   Tobacco Use  . Smoking status: Former Research scientist (life sciences)  . Smokeless tobacco: Never Used  Substance Use Topics  . Alcohol use: Yes    Alcohol/week: 0.0 oz    Comment: occasional  . Drug use: No     Medication list has been reviewed and updated.  PHQ 2/9 Scores 01/01/2017 09/20/2015 01/27/2015  PHQ - 2 Score 0 0 0  PHQ- 9 Score 0 - -    Physical Exam  Constitutional: She is oriented to person, place, and time. She appears well-developed and well-nourished.  HENT:  Right Ear: External ear and ear canal normal. Tympanic membrane is not erythematous and not retracted.  Left Ear: External ear and ear canal normal. Tympanic membrane is not erythematous and not retracted.  Nose: Right sinus exhibits maxillary sinus tenderness and frontal sinus tenderness. Left sinus exhibits maxillary sinus tenderness and frontal sinus tenderness.  Mouth/Throat: Uvula is midline and mucous membranes are normal. No oral lesions. Posterior oropharyngeal erythema present. No oropharyngeal exudate.  Cardiovascular: Normal rate, regular rhythm and normal heart sounds.    Pulmonary/Chest: Effort normal. She has decreased breath sounds. She has no wheezes. She has no rales.  Lymphadenopathy:    She has no cervical adenopathy.  Neurological: She is alert and oriented to person, place, and time.    BP 104/76   Pulse 95   Temp 97.8 F (36.6 C) (Oral)   Ht 5\' 5"  (1.651 m)   Wt 199 lb (90.3 kg)   SpO2 98%   BMI 33.12 kg/m   Assessment and Plan: 1. Acute non-recurrent sinusitis, unspecified location Begin antihistamine for PND - azithromycin (ZITHROMAX Z-PAK) 250 MG tablet; UAD  Dispense: 6 each; Refill: 0  2. Yeast vaginitis - fluconazole (DIFLUCAN) 100 MG tablet; Take 1 tablet (100 mg total) by mouth daily.  Dispense: 3 tablet; Refill: 0   Meds ordered this encounter  Medications  . azithromycin (ZITHROMAX Z-PAK) 250 MG tablet    Sig: UAD    Dispense:  6 each    Refill:  0  . fluconazole (DIFLUCAN) 100 MG tablet    Sig: Take 1 tablet (100 mg total) by mouth daily.    Dispense:  3 tablet    Refill:  0    Partially dictated using Editor, commissioning. Any errors are unintentional.  Jade Maidens, MD [facility]  03/22/2017

## 2017-04-18 ENCOUNTER — Other Ambulatory Visit: Payer: Self-pay | Admitting: Internal Medicine

## 2017-04-18 NOTE — Telephone Encounter (Signed)
Patient schedule for July 29 for physical

## 2017-06-04 ENCOUNTER — Telehealth: Payer: Self-pay

## 2017-06-04 ENCOUNTER — Other Ambulatory Visit: Payer: Self-pay | Admitting: Internal Medicine

## 2017-06-04 DIAGNOSIS — F324 Major depressive disorder, single episode, in partial remission: Secondary | ICD-10-CM

## 2017-06-04 MED ORDER — PAROXETINE HCL 20 MG PO TABS: 20.0000 mg | ORAL_TABLET | Freq: Every day | ORAL | 2 refills | Status: DC

## 2017-06-04 NOTE — Telephone Encounter (Signed)
Last OV discussed Paxil and was not sure but would like to start on it now.

## 2017-06-04 NOTE — Telephone Encounter (Signed)
Sent Paxil to [employer] [facility].  Will follow up at appt in July.

## 2017-06-05 ENCOUNTER — Other Ambulatory Visit: Payer: Self-pay | Admitting: Internal Medicine

## 2017-06-05 DIAGNOSIS — T148XXA Other injury of unspecified body region, initial encounter: Secondary | ICD-10-CM

## 2017-07-12 ENCOUNTER — Encounter: Payer: Self-pay | Admitting: Internal Medicine

## 2017-07-12 ENCOUNTER — Ambulatory Visit: Payer: BC Managed Care – PPO | Admitting: Internal Medicine

## 2017-07-12 VITALS — BP 122/72 | HR 82 | Temp 98.0°F | Resp 16 | Ht 65.0 in | Wt 186.4 lb

## 2017-07-12 DIAGNOSIS — J0140 Acute pansinusitis, unspecified: Secondary | ICD-10-CM

## 2017-07-12 MED ORDER — AZITHROMYCIN 250 MG PO TABS
ORAL_TABLET | ORAL | 0 refills | Status: DC
Start: 2017-07-12 — End: 2017-08-23

## 2017-07-12 NOTE — Progress Notes (Signed)
Date:  07/12/2017   Name:  Jade Harris   DOB:  [birth year]   MRN:  [id number]   Chief Complaint: Sinus Problem Sinus Problem  This is a new problem. The current episode started yesterday. The problem is unchanged. There has been no fever. Associated symptoms include congestion, coughing and shortness of breath. Pertinent negatives include no chills, diaphoresis, headaches, neck pain, sinus pressure, sore throat or swollen glands. Past treatments include spray decongestants and saline sprays. The treatment provided no relief.      Review of Systems  Constitutional: Negative for chills, diaphoresis and fever.  HENT: Positive for congestion and voice change. Negative for hearing loss, sinus pressure and sore throat.   Respiratory: Positive for cough and shortness of breath.   Cardiovascular: Negative for chest pain and palpitations.  Gastrointestinal: Negative for diarrhea and nausea.  Musculoskeletal: Negative for neck pain.  Neurological: Negative for headaches.    Patient Active Problem List   Diagnosis Date Noted  . Headache, chronic daily 09/20/2015  . Dyslipidemia 07/04/2014  . Local edema 07/04/2014  . Depression, major, single episode, in partial remission ([place]) 07/04/2014  . Menopause 07/04/2014  . 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  . Idiopathic insomnia 07/04/2014    Prior to Admission medications   Medication Sig Start Date End Date Taking? Authorizing Provider  EPINEPHrine 0.3 mg/0.3 mL IJ SOAJ injection Inject as directed.   Yes [provider]  estradiol (ESTRACE) 1 MG tablet TAKE 1 TABLET BY MOUTH DAILY 04/18/17  Yes Glean Hess, MD  fluconazole (DIFLUCAN) 100 MG tablet Take 1 tablet (100 mg total) by mouth daily. 03/22/17  Yes Glean Hess, MD  fluticasone [facility]) 50 MCG/ACT nasal spray Place 2 sprays into both nostrils daily. Reported on 03/30/2015 01/01/17  Yes Glean Hess, MD  methocarbamol (ROBAXIN) 500 MG tablet  TAKE 1 TABLET(500 MG) BY MOUTH THREE TIMES DAILY AS NEEDED 06/05/17  Yes Glean Hess, MD  mirtazapine (REMERON) 30 MG tablet Take 1 tablet (30 mg total) by mouth at bedtime. 03/13/16  Yes Glean Hess, MD  PARoxetine (PAXIL) 20 MG tablet Take 1 tablet (20 mg total) by mouth daily. 06/04/17  Yes Glean Hess, MD  SUMAtriptan (IMITREX) 100 MG tablet  02/17/16  Yes [provider]  Topiramate ER (TROKENDI XR) 100 MG CP24 Take 100 mg by mouth as needed. 12/29/15  Yes [provider]  triamcinolone cream (KENALOG) 0.1 % Apply 1 application topically 2 (two) times daily. 01/01/17  Yes Glean Hess, MD  triamterene-hydrochlorothiazide (MAXZIDE-25) 37.5-25 MG tablet Take 1.5 tablets by mouth daily. 06/11/16  Yes Glean Hess, MD    Allergies  Allergen Reactions  . Sulfa Antibiotics Other (See Comments)    Past Surgical History:  Procedure Laterality Date  . APPENDECTOMY    . MOUTH SURGERY    . NASAL SINUS SURGERY  04/2014  . TONSILLECTOMY    . TOTAL ABDOMINAL HYSTERECTOMY W/ BILATERAL SALPINGOOPHORECTOMY     wtih oophorectomy    Social History   Tobacco Use  . Smoking status: Former Research scientist (life sciences)  . Smokeless tobacco: Never Used  Substance Use Topics  . Alcohol use: Yes    Alcohol/week: 0.0 oz    Comment: occasional  . Drug use: No     Medication list has been reviewed and updated.  Current Meds  Medication Sig  . EPINEPHrine 0.3 mg/0.3 mL IJ SOAJ injection Inject as directed.  Marland Kitchen estradiol (ESTRACE) 1 MG tablet  TAKE 1 TABLET BY MOUTH DAILY  . fluconazole (DIFLUCAN) 100 MG tablet Take 1 tablet (100 mg total) by mouth daily.  . fluticasone (FLONASE) 50 MCG/ACT nasal spray Place 2 sprays into both nostrils daily. Reported on 03/30/2015  . methocarbamol (ROBAXIN) 500 MG tablet TAKE 1 TABLET(500 MG) BY MOUTH THREE TIMES DAILY AS NEEDED  . mirtazapine (REMERON) 30 MG tablet Take 1 tablet (30 mg total) by mouth at bedtime.  Marland Kitchen PARoxetine (PAXIL) 20 MG tablet  Take 1 tablet (20 mg total) by mouth daily.  . SUMAtriptan (IMITREX) 100 MG tablet   . Topiramate ER (TROKENDI XR) 100 MG CP24 Take 100 mg by mouth as needed.  . triamcinolone cream (KENALOG) 0.1 % Apply 1 application topically 2 (two) times daily.  Marland Kitchen triamterene-hydrochlorothiazide (MAXZIDE-25) 37.5-25 MG tablet Take 1.5 tablets by mouth daily.    PHQ 2/9 Scores 07/12/2017 01/01/2017 09/20/2015 01/27/2015  PHQ - 2 Score 0 0 0 0  PHQ- 9 Score 1 0 - -    Physical Exam  Constitutional: She is oriented to person, place, and time. She appears well-developed and well-nourished.  HENT:  Right Ear: External ear and ear canal normal. Tympanic membrane is not erythematous and not retracted.  Left Ear: External ear and ear canal normal. Tympanic membrane is not erythematous and not retracted.  Nose: Right sinus exhibits maxillary sinus tenderness. Right sinus exhibits no frontal sinus tenderness. Left sinus exhibits maxillary sinus tenderness. Left sinus exhibits no frontal sinus tenderness.  Mouth/Throat: Uvula is midline and mucous membranes are normal. No oral lesions. No oropharyngeal exudate, posterior oropharyngeal edema or posterior oropharyngeal erythema.  Cardiovascular: Normal rate, regular rhythm and normal heart sounds.  Pulmonary/Chest: Breath sounds normal. She has no wheezes. She has no rhonchi. She has no rales.  Lymphadenopathy:    She has no cervical adenopathy.  Neurological: She is alert and oriented to person, place, and time.    BP 122/72   Pulse 82   Temp 98 F (36.7 C) (Oral)   Resp 16   Ht 5\' 5"  (1.651 m)   Wt 186 lb 6.4 oz (84.6 kg)   SpO2 98%   BMI 31.02 kg/m   Assessment and Plan: 1. Acute non-recurrent pansinusitis Continue flonase Add Allegra or Claritin Take Zpak if sx worsen - azithromycin (ZITHROMAX Z-PAK) 250 MG tablet; UAD  Dispense: 6 each; Refill: 0   Meds ordered this encounter  Medications  . azithromycin (ZITHROMAX Z-PAK) 250 MG tablet     Sig: UAD    Dispense:  6 each    Refill:  0    Partially dictated using Editor, commissioning. Any errors are unintentional.  Halina Maidens, MD [facility]  07/12/2017

## 2017-07-12 NOTE — Patient Instructions (Signed)
Continue flonase  Add Allegra or Claritin daily for drainage

## 2017-08-06 ENCOUNTER — Other Ambulatory Visit: Payer: Self-pay | Admitting: Internal Medicine

## 2017-08-23 ENCOUNTER — Ambulatory Visit (INDEPENDENT_AMBULATORY_CARE_PROVIDER_SITE_OTHER): Payer: BC Managed Care – PPO | Admitting: Internal Medicine

## 2017-08-23 ENCOUNTER — Encounter: Payer: Self-pay | Admitting: Internal Medicine

## 2017-08-23 VITALS — BP 104/70 | HR 68 | Ht 65.0 in | Wt 176.0 lb

## 2017-08-23 DIAGNOSIS — Z1211 Encounter for screening for malignant neoplasm of colon: Secondary | ICD-10-CM

## 2017-08-23 DIAGNOSIS — M47816 Spondylosis without myelopathy or radiculopathy, lumbar region: Secondary | ICD-10-CM

## 2017-08-23 DIAGNOSIS — E785 Hyperlipidemia, unspecified: Secondary | ICD-10-CM | POA: Diagnosis not present

## 2017-08-23 DIAGNOSIS — L309 Dermatitis, unspecified: Secondary | ICD-10-CM

## 2017-08-23 DIAGNOSIS — Z0001 Encounter for general adult medical examination with abnormal findings: Secondary | ICD-10-CM | POA: Diagnosis not present

## 2017-08-23 DIAGNOSIS — F324 Major depressive disorder, single episode, in partial remission: Secondary | ICD-10-CM

## 2017-08-23 DIAGNOSIS — R600 Localized edema: Secondary | ICD-10-CM

## 2017-08-23 DIAGNOSIS — Z780 Asymptomatic menopausal state: Secondary | ICD-10-CM | POA: Diagnosis not present

## 2017-08-23 DIAGNOSIS — Z0000 Encounter for general adult medical examination without abnormal findings: Secondary | ICD-10-CM

## 2017-08-23 DIAGNOSIS — Z1239 Encounter for other screening for malignant neoplasm of breast: Secondary | ICD-10-CM

## 2017-08-23 LAB — POCT URINALYSIS DIPSTICK
Bilirubin, UA: NEGATIVE
Glucose, UA: NEGATIVE
Ketones, UA: NEGATIVE
LEUKOCYTES UA: NEGATIVE
Nitrite, UA: NEGATIVE
PH UA: 6 (ref 5.0–8.0)
Protein, UA: POSITIVE — AB
RBC UA: NEGATIVE
Spec Grav, UA: 1.015 (ref 1.010–1.025)
UROBILINOGEN UA: 0.2 U/dL

## 2017-08-23 MED ORDER — METHOCARBAMOL 500 MG PO TABS: 500.0000 mg | ORAL_TABLET | Freq: Four times a day (QID) | ORAL | 2 refills | Status: DC | PRN

## 2017-08-23 MED ORDER — FLUTICASONE PROPIONATE 50 MCG/ACT NA SUSP: 2.0000 | Freq: Every day | NASAL | 5 refills | Status: DC

## 2017-08-23 MED ORDER — ESTRADIOL 1 MG PO TABS: 1.0000 mg | ORAL_TABLET | Freq: Every day | ORAL | 5 refills | Status: DC

## 2017-08-23 NOTE — Patient Instructions (Signed)

## 2017-08-23 NOTE — Progress Notes (Signed)
Date:  08/23/2017   Name:  Jade Harris   DOB:  Jan 03, 1966   MRN:  [id number]   Chief Complaint: Annual Exam (Breast Exam. ) Jade Harris is a 52 y.o. female who presents today for her Complete Annual Exam. She feels fairly well. She reports exercising some but limited by back pain. She reports she is sleeping fairly well. She denies breast issues - is due for a mammogram.  She is also due for a colonoscopy - reluctant to have this done but after discussion, she agrees. She has been eating less, seeing Bariatrics, taking Saxenda and Adipex and has lost about 25 lbs over the past year.  Hypertension  This is a chronic problem. The problem is controlled. Pertinent negatives include no chest pain, headaches, palpitations or shortness of breath. Past treatments include diuretics.  Hyperlipidemia  This is a chronic problem. Pertinent negatives include no chest pain or shortness of breath. Current antihyperlipidemic treatment includes diet change.  Depression         The problem occurs rarely.  Associated symptoms include no fatigue, no headaches and no suicidal ideas.  Treatments tried: did not start the Paxil prescribed in May. Back Pain  This is a recurrent problem. The problem has been waxing and waning since onset. The pain is present in the lumbar spine. The quality of the pain is described as aching and cramping. Pertinent negatives include no abdominal pain, chest pain, dysuria, fever or headaches. She has tried muscle relaxant for the symptoms. The treatment provided moderate relief.      Review of Systems  Constitutional: Negative for chills, fatigue and fever.  HENT: Negative for congestion, hearing loss, tinnitus, trouble swallowing and voice change.   Eyes: Negative for visual disturbance.  Respiratory: Negative for cough, chest tightness, shortness of breath and wheezing.   Cardiovascular: Negative for chest pain, palpitations and leg swelling.  Gastrointestinal:  Negative for abdominal pain, constipation, diarrhea and vomiting.  Endocrine: Negative for polydipsia and polyuria.  Genitourinary: Negative for dysuria, frequency, genital sores and vaginal discharge.  Musculoskeletal: Positive for back pain. Negative for arthralgias, gait problem and joint swelling.  Skin: Positive for rash (persistent eczema of hands). Negative for color change.  Neurological: Negative for dizziness, tremors, light-headedness and headaches.  Hematological: Negative for adenopathy. Does not bruise/bleed easily.  Psychiatric/Behavioral: Positive for depression. Negative for dysphoric mood, sleep disturbance and suicidal ideas. The patient is nervous/anxious (at times).     Patient Active Problem List   Diagnosis Date Noted  . Hypertension 10/29/2016  . Headache, chronic daily 09/20/2015  . Dyslipidemia 07/04/2014  . Local edema 07/04/2014  . Depression, major, single episode, in partial remission ([place]) 07/04/2014  . Menopause 07/04/2014  . 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  . Idiopathic insomnia 07/04/2014    Prior to Admission medications   Medication Sig Start Date End Date Taking? Authorizing Provider  Calcipotriene-Betameth Diprop (ENSTILAR) 0.005-0.064 % FOAM Apply topically.   Yes [provider]  Crisaborole (EUCRISA) 2 % OINT Apply topically.   Yes [provider]  EPINEPHrine 0.3 mg/0.3 mL IJ SOAJ injection Inject as directed.   Yes [provider]  estradiol (ESTRACE) 1 MG tablet TAKE 1 TABLET BY MOUTH DAILY 04/18/17  Yes Glean Hess, MD  fluticasone [facility]) 50 MCG/ACT nasal spray Place 2 sprays into both nostrils daily. Reported on 03/30/2015 01/01/17  Yes Glean Hess, MD  methocarbamol (ROBAXIN) 500 MG tablet TAKE 1 TABLET(500 MG) BY MOUTH THREE  TIMES DAILY AS NEEDED 06/05/17  Yes Glean Hess, MD  mirtazapine (REMERON) 30 MG tablet Take 1 tablet (30 mg total) by mouth at bedtime. 03/13/16  Yes  Glean Hess, MD  PARoxetine (PAXIL) 20 MG tablet Take 1 tablet (20 mg total) by mouth daily. 06/04/17  Yes Glean Hess, MD  SUMAtriptan (IMITREX) 100 MG tablet  02/17/16  Yes [provider]  triamterene-hydrochlorothiazide (MAXZIDE-25) 37.5-25 MG tablet TAKE 1 AND 1/2 TABLETS BY MOUTH DAILY 08/07/17  Yes Glean Hess, MD    Allergies  Allergen Reactions  . Sulfa Antibiotics Other (See Comments)    Past Surgical History:  Procedure Laterality Date  . APPENDECTOMY    . MOUTH SURGERY    . NASAL SINUS SURGERY  04/2014  . TONSILLECTOMY    . TOTAL ABDOMINAL HYSTERECTOMY W/ BILATERAL SALPINGOOPHORECTOMY     wtih oophorectomy    Social History   Tobacco Use  . Smoking status: Former Research scientist (life sciences)  . Smokeless tobacco: Never Used  Substance Use Topics  . Alcohol use: Yes    Alcohol/week: 0.0 oz    Comment: occasional  . Drug use: No     Medication list has been reviewed and updated.  Current Meds  Medication Sig  . Calcipotriene-Betameth Diprop (ENSTILAR) 0.005-0.064 % FOAM Apply topically.  Stasia Cavalier (EUCRISA) 2 % OINT Apply topically.  Marland Kitchen EPINEPHrine 0.3 mg/0.3 mL IJ SOAJ injection Inject as directed.  Marland Kitchen estradiol (ESTRACE) 1 MG tablet TAKE 1 TABLET BY MOUTH DAILY  . fluticasone (FLONASE) 50 MCG/ACT nasal spray Place 2 sprays into both nostrils daily. Reported on 03/30/2015  . methocarbamol (ROBAXIN) 500 MG tablet TAKE 1 TABLET(500 MG) BY MOUTH THREE TIMES DAILY AS NEEDED  . SUMAtriptan (IMITREX) 100 MG tablet   . triamterene-hydrochlorothiazide (MAXZIDE-25) 37.5-25 MG tablet TAKE 1 AND 1/2 TABLETS BY MOUTH DAILY    PHQ 2/9 Scores 07/12/2017 01/01/2017 09/20/2015 01/27/2015  PHQ - 2 Score 0 0 0 0  PHQ- 9 Score 1 0 - -    Physical Exam  Constitutional: She is oriented to person, place, and time. She appears well-developed and well-nourished. No distress.  HENT:  Head: Normocephalic and atraumatic.  Right Ear: Tympanic membrane and ear canal normal.  Left  Ear: Tympanic membrane and ear canal normal.  Nose: Right sinus exhibits no maxillary sinus tenderness. Left sinus exhibits no maxillary sinus tenderness.  Mouth/Throat: Uvula is midline and oropharynx is clear and moist.  Eyes: Conjunctivae and EOM are normal. Right eye exhibits no discharge. Left eye exhibits no discharge. No scleral icterus.  Neck: Normal range of motion. Carotid bruit is not present. No erythema present. No thyromegaly present.  Cardiovascular: Normal rate, regular rhythm, normal heart sounds and normal pulses.  Pulmonary/Chest: Effort normal. No respiratory distress. She has no wheezes. Right breast exhibits no mass, no nipple discharge, no skin change and no tenderness. Left breast exhibits no mass, no nipple discharge, no skin change and no tenderness.  Abdominal: Soft. Bowel sounds are normal. There is no hepatosplenomegaly. There is no tenderness. There is no CVA tenderness.  Musculoskeletal: Normal range of motion.       Lumbar back: She exhibits spasm.  Lymphadenopathy:    She has no cervical adenopathy.    She has no axillary adenopathy.  Neurological: She is alert and oriented to person, place, and time. She has normal strength and normal reflexes. No cranial nerve deficit or sensory deficit.  Skin: Skin is warm, dry and intact. No rash noted.  Eczema of both hands Multiple nevi over chest, back and abdomen  Psychiatric: She has a normal mood and affect. Her speech is normal and behavior is normal. Thought content normal.  Nursing note and vitals reviewed.   BP 104/70   Pulse 68   Ht 5\' 5"  (1.651 m)   Wt 176 lb (79.8 kg)   SpO2 97%   BMI 29.29 kg/m   Assessment and Plan: 1. Annual physical exam Continue healthy diet Continue weight loss meds and follow up with Bariatrics - POCT urinalysis dipstick  2. Depression, major, single episode, in partial remission [facility]) May need to start taking Paxil to help with anxiety associated with work (school starts  back soon) - TSH  3. Breast cancer screening Pt to schedule at [facility]; Future  4. Colon cancer screening - Ambulatory referral to Gastroenterology  5. Dyslipidemia Check labs and advise - Lipid panel  6. Local edema controlled - CBC with Differential/Platelet - Comprehensive metabolic panel  7. Menopause Continue low dose HRT - estradiol (ESTRACE) 1 MG tablet; Take 1 tablet (1 mg total) by mouth daily.  Dispense: 30 tablet; Refill: 5  8. 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  Dispense: 100 tablet; Refill: 2  9. Eczema, unspecified type Follow up with Dermatology as planned   Meds ordered this encounter  Medications  . estradiol (ESTRACE) 1 MG tablet    Sig: Take 1 tablet (1 mg total) by mouth daily.    Dispense:  30 tablet    Refill:  5  . methocarbamol (ROBAXIN) 500 MG tablet    Sig: Take 1 tablet (500 mg total) by mouth every 6 (six) hours as needed for muscle spasms.    Dispense:  100 tablet    Refill:  2  . fluticasone (FLONASE) 50 MCG/ACT nasal spray    Sig: Place 2 sprays into both nostrils daily. Reported on 03/30/2015    Dispense:  16 g    Refill:  5    Partially dictated using Editor, commissioning. Any errors are unintentional.  Halina Maidens, MD [facility]  08/23/2017

## 2017-08-24 LAB — COMPREHENSIVE METABOLIC PANEL
A/G RATIO: 1.9 (ref 1.2–2.2)
ALK PHOS: 56 IU/L (ref 39–117)
ALT: 16 IU/L (ref 0–32)
AST: 17 IU/L (ref 0–40)
Albumin: 5 g/dL (ref 3.5–5.5)
BILIRUBIN TOTAL: 0.5 mg/dL (ref 0.0–1.2)
BUN/Creatinine Ratio: 16 (ref 9–23)
BUN: 14 mg/dL (ref 6–24)
CHLORIDE: 97 mmol/L (ref 96–106)
CO2: 22 mmol/L (ref 20–29)
Calcium: 9.9 mg/dL (ref 8.7–10.2)
Creatinine, Ser: 0.85 mg/dL (ref 0.57–1.00)
GFR calc Af Amer: 90 mL/min/{1.73_m2} (ref >59)
GFR calc non Af Amer: 78 mL/min/{1.73_m2} (ref >59)
GLUCOSE: 73 mg/dL (ref 65–99)
Globulin, Total: 2.6 g/dL (ref 1.5–4.5)
Potassium: 4.6 mmol/L (ref 3.5–5.2)
Sodium: 140 mmol/L (ref 134–144)
Total Protein: 7.6 g/dL (ref 6.0–8.5)

## 2017-08-24 LAB — CBC WITH DIFFERENTIAL/PLATELET
Basophils Absolute: 0 10*3/uL (ref 0.0–0.2)
Basos: 0 %
EOS (ABSOLUTE): 0 10*3/uL (ref 0.0–0.4)
Eos: 0 %
Hematocrit: 42.7 % (ref 34.0–46.6)
Hemoglobin: 14.2 g/dL (ref 11.1–15.9)
Immature Grans (Abs): 0 10*3/uL (ref 0.0–0.1)
Immature Granulocytes: 0 %
LYMPHS ABS: 1.9 10*3/uL (ref 0.7–3.1)
Lymphs: 41 %
MCH: 29.8 pg (ref 26.6–33.0)
MCHC: 33.3 g/dL (ref 31.5–35.7)
MCV: 90 fL (ref 79–97)
MONOS ABS: 0.4 10*3/uL (ref 0.1–0.9)
Monocytes: 8 %
Neutrophils Absolute: 2.4 10*3/uL (ref 1.4–7.0)
Neutrophils: 51 %
Platelets: 320 10*3/uL (ref 150–450)
RBC: 4.77 x10E6/uL (ref 3.77–5.28)
RDW: 13.3 % (ref 12.3–15.4)
WBC: 4.8 10*3/uL (ref 3.4–10.8)

## 2017-08-24 LAB — LIPID PANEL
CHOLESTEROL TOTAL: 253 mg/dL — AB (ref 100–199)
Chol/HDL Ratio: 2.8 ratio (ref 0.0–4.4)
HDL: 91 mg/dL (ref >39)
LDL Calculated: 145 mg/dL — ABNORMAL HIGH (ref 0–99)
TRIGLYCERIDES: 87 mg/dL (ref 0–149)
VLDL Cholesterol Cal: 17 mg/dL (ref 5–40)

## 2017-08-24 LAB — TSH: TSH: 1.77 u[IU]/mL (ref 0.450–4.500)

## 2017-08-26 ENCOUNTER — Ambulatory Visit
Admission: RE | Admit: 2017-08-26 | Discharge: 2017-08-26 | Disposition: A | Discharge status: Home / Self Care | Payer: BC Managed Care – PPO | Source: Ambulatory Visit | Attending: Internal Medicine | Admitting: Internal Medicine

## 2017-08-26 DIAGNOSIS — Z1231 Encounter for screening mammogram for malignant neoplasm of breast: Secondary | ICD-10-CM | POA: Insufficient documentation

## 2017-08-26 DIAGNOSIS — Z1239 Encounter for other screening for malignant neoplasm of breast: Secondary | ICD-10-CM

## 2017-08-26 HISTORY — DX: Malignant (primary) neoplasm, unspecified: C80.1

## 2017-08-30 ENCOUNTER — Telehealth: Payer: Self-pay | Admitting: Gastroenterology

## 2017-08-30 NOTE — Telephone Encounter (Signed)
Gastroenterology Pre-Procedure Review  Request Date: 09/10/17   [facility] Requesting Physician: Dr. Bonna Gains  PATIENT REVIEW QUESTIONS: The patient responded to the following health history questions as indicated:    1. Are you having any GI issues? no 2. Do you have a personal history of Polyps? no 3. Do you have a family history of Colon Cancer or Polyps? no 4. Diabetes Mellitus? no 5. Joint replacements in the past 12 months?no 6. Major health problems in the past 3 months?no 7. Any artificial heart valves, MVP, or defibrillator?no    MEDICATIONS & ALLERGIES:    Patient reports the following regarding taking any anticoagulation/antiplatelet therapy:   Plavix, Coumadin, Eliquis, Xarelto, Lovenox, Pradaxa, Brilinta, or Effient? no Aspirin? no  Patient confirms/reports the following medications:  Current Outpatient Medications  Medication Sig Dispense Refill  . Calcipotriene-Betameth Diprop (ENSTILAR) 0.005-0.064 % FOAM Apply topically.    Stasia Cavalier (EUCRISA) 2 % OINT Apply topically.    Marland Kitchen EPINEPHrine 0.3 mg/0.3 mL IJ SOAJ injection Inject as directed.    Marland Kitchen estradiol (ESTRACE) 1 MG tablet Take 1 tablet (1 mg total) by mouth daily. 30 tablet 5  . fluticasone (FLONASE) 50 MCG/ACT nasal spray Place 2 sprays into both nostrils daily. Reported on 03/30/2015 16 g 5  . Liraglutide -Weight Management (SAXENDA) 18 MG/3ML SOPN Inject 3 mg into the skin daily.    . methocarbamol (ROBAXIN) 500 MG tablet Take 1 tablet (500 mg total) by mouth every 6 (six) hours as needed for muscle spasms. 100 tablet 2  . mirtazapine (REMERON) 30 MG tablet Take 1 tablet (30 mg total) by mouth at bedtime. (Patient not taking: Reported on 08/23/2017) 30 tablet 3  . PARoxetine (PAXIL) 20 MG tablet Take 1 tablet (20 mg total) by mouth daily. (Patient not taking: Reported on 08/23/2017) 30 tablet 2  . phentermine (ADIPEX-P) 37.5 MG tablet Take 18.75 mg by mouth daily before breakfast.    . SUMAtriptan (IMITREX) 100 MG  tablet   5  . triamterene-hydrochlorothiazide (MAXZIDE-25) 37.5-25 MG tablet TAKE 1 AND 1/2 TABLETS BY MOUTH DAILY 45 tablet 12   No current facility-administered medications for this visit.     Patient confirms/reports the following allergies:  Allergies  Allergen Reactions  . Sulfa Antibiotics Other (See Comments)    No orders of the defined types were placed in this encounter.   AUTHORIZATION INFORMATION Primary Insurance: 1D#: Group #:  Secondary Insurance: 1D#: Group #:  SCHEDULE INFORMATION: Date: 09/10/17    [place] Time: Location: [place]

## 2017-09-02 ENCOUNTER — Other Ambulatory Visit: Payer: Self-pay

## 2017-09-02 DIAGNOSIS — Z1211 Encounter for screening for malignant neoplasm of colon: Secondary | ICD-10-CM

## 2017-09-02 MED ORDER — NA SULFATE-K SULFATE-MG SULF 17.5-3.13-1.6 GM/177ML PO SOLN: 1.0000 | Freq: Once | ORAL | 0 refills | Status: AC

## 2017-09-03 ENCOUNTER — Other Ambulatory Visit: Payer: Self-pay | Admitting: Internal Medicine

## 2017-09-06 ENCOUNTER — Telehealth: Payer: Self-pay

## 2017-09-06 NOTE — Telephone Encounter (Signed)
Patient contacted office to cancel colonoscopy.  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  Will call back to schedule after she gets her work schedule.  Thanks [employer]

## 2017-09-10 ENCOUNTER — Encounter: Admission: RE | Payer: Self-pay | Source: Ambulatory Visit

## 2017-09-10 ENCOUNTER — Ambulatory Visit
Admission: RE | Admit: 2017-09-10 | Payer: BC Managed Care – PPO | Source: Ambulatory Visit | Admitting: Gastroenterology

## 2017-09-10 SURGERY — COLONOSCOPY WITH PROPOFOL
Anesthesia: Choice

## 2017-10-21 ENCOUNTER — Encounter: Payer: Self-pay | Admitting: Internal Medicine

## 2017-10-21 ENCOUNTER — Ambulatory Visit: Payer: BC Managed Care – PPO | Admitting: Internal Medicine

## 2017-10-21 VITALS — BP 104/70 | HR 80 | Temp 97.9°F | Ht 65.0 in | Wt 180.0 lb

## 2017-10-21 DIAGNOSIS — J0100 Acute maxillary sinusitis, unspecified: Secondary | ICD-10-CM

## 2017-10-21 MED ORDER — AZITHROMYCIN 250 MG PO TABS: ORAL_TABLET | ORAL | 0 refills | Status: AC

## 2017-10-21 NOTE — Progress Notes (Signed)
Date:  10/21/2017   Name:  Jade Harris   DOB:  Jul 09, 1965   MRN:  [id number]   Chief Complaint: Sinusitis (Started Thursday. Eyes burning, bilateral ear pain, and facial congestion. Cough started last night - colorful production. )  Sinusitis  This is a new problem. The current episode started in the past 7 days. The problem has been gradually worsening since onset. There has been no fever. The pain is mild. Associated symptoms include congestion, coughing, headaches and sinus pressure. Pertinent negatives include no diaphoresis or ear pain. Past treatments include oral decongestants and spray decongestants. The treatment provided mild relief.    Review of Systems  Constitutional: Negative for diaphoresis and fever.  HENT: Positive for congestion and sinus pressure. Negative for ear pain, mouth sores and trouble swallowing.   Respiratory: Positive for cough.   Gastrointestinal: Negative for diarrhea and nausea.  Neurological: Positive for headaches. Negative for dizziness.    Patient Active Problem List   Diagnosis Date Noted  . Eczema 08/23/2017  . Hypertension 10/29/2016  . Headache, chronic daily 09/20/2015  . Dyslipidemia 07/04/2014  . Local edema 07/04/2014  . Depression, major, single episode, in partial remission ([place]) 07/04/2014  . Menopause 07/04/2014  . 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  . Idiopathic insomnia 07/04/2014    Allergies  Allergen Reactions  . Sulfa Antibiotics Other (See Comments)    Past Surgical History:  Procedure Laterality Date  . ABDOMINAL HYSTERECTOMY    . APPENDECTOMY    . MOUTH SURGERY    . NASAL SINUS SURGERY  04/2014  . TONSILLECTOMY    . TOTAL ABDOMINAL HYSTERECTOMY W/ BILATERAL SALPINGOOPHORECTOMY     wtih oophorectomy    Social History   Tobacco Use  . Smoking status: Former Research scientist (life sciences)  . Smokeless tobacco: Never Used  Substance Use Topics  . Alcohol use: Yes    Alcohol/week: 0.0 standard drinks   Comment: occasional  . Drug use: No     Medication list has been reviewed and updated.  Current Meds  Medication Sig  . Calcipotriene-Betameth Diprop (ENSTILAR) 0.005-0.064 % FOAM Apply topically.  Stasia Cavalier (EUCRISA) 2 % OINT Apply topically.  Marland Kitchen EPINEPHrine 0.3 mg/0.3 mL IJ SOAJ injection Inject as directed.  Marland Kitchen estradiol (ESTRACE) 1 MG tablet TAKE 1 TABLET BY MOUTH DAILY  . fluticasone (FLONASE) 50 MCG/ACT nasal spray Place 2 sprays into both nostrils daily. Reported on 03/30/2015  . Liraglutide -Weight Management (SAXENDA) 18 MG/3ML SOPN Inject 3 mg into the skin daily.  . methocarbamol (ROBAXIN) 500 MG tablet Take 1 tablet (500 mg total) by mouth every 6 (six) hours as needed for muscle spasms.  . mirtazapine (REMERON) 30 MG tablet Take 1 tablet (30 mg total) by mouth at bedtime.  Marland Kitchen PARoxetine (PAXIL) 20 MG tablet Take 1 tablet (20 mg total) by mouth daily.  . phentermine (ADIPEX-P) 37.5 MG tablet Take 18.75 mg by mouth daily before breakfast.  . SUMAtriptan (IMITREX) 100 MG tablet   . triamterene-hydrochlorothiazide (MAXZIDE-25) 37.5-25 MG tablet TAKE 1 AND 1/2 TABLETS BY MOUTH DAILY    PHQ 2/9 Scores 10/21/2017 07/12/2017 01/01/2017 09/20/2015  PHQ - 2 Score 0 0 0 0  PHQ- 9 Score 0 1 0 -    Physical Exam  Constitutional: She is oriented to person, place, and time. She appears well-developed and well-nourished.  HENT:  Right Ear: External ear and ear canal normal. Tympanic membrane is not erythematous and not retracted.  Left Ear: External ear and ear  canal normal. Tympanic membrane is not erythematous and not retracted.  Nose: Right sinus exhibits maxillary sinus tenderness. Right sinus exhibits no frontal sinus tenderness. Left sinus exhibits maxillary sinus tenderness. Left sinus exhibits no frontal sinus tenderness.  Mouth/Throat: Uvula is midline and mucous membranes are normal. No oral lesions. Posterior oropharyngeal erythema present. No oropharyngeal exudate.  Neck:  Normal range of motion. Neck supple.  Cardiovascular: Normal rate, regular rhythm and normal heart sounds.  Pulmonary/Chest: Breath sounds normal. She has no wheezes. She has no rales.  Lymphadenopathy:    She has no cervical adenopathy.  Neurological: She is alert and oriented to person, place, and time.    BP 104/70 (BP Location: Right Arm, Patient Position: Sitting, Cuff Size: Normal)   Pulse 80   Temp 97.9 F (36.6 C) (Oral)   Ht 5\' 5"  (1.651 m)   Wt 180 lb (81.6 kg)   SpO2 98%   BMI 29.95 kg/m   Assessment and Plan: 1. Acute non-recurrent maxillary sinusitis Continue sudafed and flonase - azithromycin (ZITHROMAX Z-PAK) 250 MG tablet; UAD  Dispense: 6 each; Refill: 0   Partially dictated using Editor, commissioning. Any errors are unintentional.  Halina Maidens, MD [facility]  10/21/2017

## 2017-10-22 ENCOUNTER — Telehealth: Payer: Self-pay

## 2017-10-22 NOTE — Telephone Encounter (Signed)
-----   Message from Arie Sabina sent at 10/21/2017  4:53 PM EDT -----   ----- Message ----- From: Glean Hess, MD Sent: 10/21/2017   2:32 PM EDT To: Arie Sabina  Please call this pt to reschedule her colonoscopy.  She agrees to do it this time. Thanks Dr. Army Melia

## 2017-10-22 NOTE — Telephone Encounter (Signed)
LVM for pt to call office back to schedule her colonoscopy.  Thanks Kiyo Heal  

## 2017-10-24 ENCOUNTER — Telehealth: Payer: Self-pay

## 2017-10-24 ENCOUNTER — Other Ambulatory Visit: Payer: Self-pay | Admitting: Internal Medicine

## 2017-10-24 DIAGNOSIS — J0100 Acute maxillary sinusitis, unspecified: Secondary | ICD-10-CM

## 2017-10-24 MED ORDER — AMOXICILLIN-POT CLAVULANATE 875-125 MG PO TABS: 1.0000 | ORAL_TABLET | Freq: Two times a day (BID) | ORAL | 0 refills | Status: AC

## 2017-10-24 NOTE — Telephone Encounter (Signed)
Sent augmentin to [facility] in [place].

## 2017-10-24 NOTE — Telephone Encounter (Signed)
Patient called stating she started a z pack Monday night and she feels like it is not strong enough to knock out this sinus infection- she is feeling worse. Wants to know if we can send in stronger antibiotic for this.  Please Advise.

## 2017-10-25 ENCOUNTER — Other Ambulatory Visit: Payer: Self-pay

## 2017-10-25 MED ORDER — FLUCONAZOLE 150 MG PO TABS: 150.0000 mg | ORAL_TABLET | Freq: Once | ORAL | 1 refills | Status: AC

## 2017-10-25 NOTE — Telephone Encounter (Signed)
Patient informed. She said last time she took Augmentin she got a yeast infection so she wanted to know if we could send in diflucan to her pharmacy as well just in case. Sent in diflucan.

## 2018-03-31 ENCOUNTER — Other Ambulatory Visit: Payer: Self-pay | Admitting: Internal Medicine

## 2018-03-31 DIAGNOSIS — F324 Major depressive disorder, single episode, in partial remission: Secondary | ICD-10-CM

## 2018-07-09 ENCOUNTER — Encounter: Payer: Self-pay | Admitting: Internal Medicine

## 2018-07-09 ENCOUNTER — Ambulatory Visit: Payer: BC Managed Care – PPO | Admitting: Internal Medicine

## 2018-07-09 ENCOUNTER — Other Ambulatory Visit: Payer: Self-pay

## 2018-07-09 VITALS — BP 128/70 | HR 74 | Ht 65.0 in | Wt 191.0 lb

## 2018-07-09 DIAGNOSIS — M722 Plantar fascial fibromatosis: Secondary | ICD-10-CM | POA: Diagnosis not present

## 2018-07-09 DIAGNOSIS — F324 Major depressive disorder, single episode, in partial remission: Secondary | ICD-10-CM | POA: Diagnosis not present

## 2018-07-09 MED ORDER — PREDNISONE 10 MG PO TABS: 10.0000 mg | ORAL_TABLET | ORAL | 0 refills | Status: AC

## 2018-07-09 NOTE — Progress Notes (Signed)
Date:  07/09/2018   Name:  Jade Harris   DOB:  1965/03/25   MRN:  [id number]   Chief Complaint: Foot Pain (Left foot arch hurts. Started 2 months ago. 2 days ago- on feet all day at work and foot was severly painful. Sitting - dull pain. Sharpe when standing- Started in arch and shoot up into her ankle.) and Depression ([id number])  Foot Injury   There was no injury mechanism. The pain is present in the left foot. The quality of the pain is described as aching. The pain is mild. The pain has been intermittent since onset. The symptoms are aggravated by weight bearing.  Depression         This is a chronic problem.The problem is unchanged.  Associated symptoms include no fatigue and no headaches.  Past treatments include SSRIs - Selective serotonin reuptake inhibitors.  Compliance with treatment is good.  Improvement on treatment: more symptoms recently with Covid restrictions. Pain is worse after sitting and first thing in the AM.  She has used ice and ibuprofen.  It has not been red or hot, slightly swollen.  Review of Systems  Constitutional: Negative for fatigue and fever.  Respiratory: Negative for chest tightness and shortness of breath.   Cardiovascular: Negative for chest pain, palpitations and leg swelling.  Musculoskeletal: Positive for arthralgias and gait problem. Negative for joint swelling.  Neurological: Negative for dizziness and headaches.  Psychiatric/Behavioral: Positive for depression and dysphoric mood (due to covid restrictions). The patient is not nervous/anxious.     Patient Active Problem List   Diagnosis Date Noted  . Eczema 08/23/2017  . Hypertension 10/29/2016  . Headache, chronic daily 09/20/2015  . Dyslipidemia 07/04/2014  . Local edema 07/04/2014  . Depression, major, single episode, in partial remission ([place]) 07/04/2014  . Menopause 07/04/2014  . 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  . Idiopathic insomnia 07/04/2014    Allergies   Allergen Reactions  . Sulfa Antibiotics Other (See Comments)    Past Surgical History:  Procedure Laterality Date  . ABDOMINAL HYSTERECTOMY    . APPENDECTOMY    . MOUTH SURGERY    . NASAL SINUS SURGERY  04/2014  . TONSILLECTOMY    . TOTAL ABDOMINAL HYSTERECTOMY W/ BILATERAL SALPINGOOPHORECTOMY     wtih oophorectomy    Social History   Tobacco Use  . Smoking status: Former Research scientist (life sciences)  . Smokeless tobacco: Never Used  Substance Use Topics  . Alcohol use: Yes    Alcohol/week: 0.0 standard drinks    Comment: occasional  . Drug use: No     Medication list has been reviewed and updated.  Current Meds  Medication Sig  . Calcipotriene-Betameth Diprop (ENSTILAR) 0.005-0.064 % FOAM Apply topically.  Stasia Cavalier (EUCRISA) 2 % OINT Apply topically.  Marland Kitchen EPINEPHrine 0.3 mg/0.3 mL IJ SOAJ injection Inject as directed.  Marland Kitchen estradiol (ESTRACE) 1 MG tablet TAKE 1 TABLET BY MOUTH DAILY  . fluticasone (FLONASE) 50 MCG/ACT nasal spray Place 2 sprays into both nostrils daily. Reported on 03/30/2015  . methocarbamol (ROBAXIN) 500 MG tablet Take 1 tablet (500 mg total) by mouth every 6 (six) hours as needed for muscle spasms.  Marland Kitchen PARoxetine (PAXIL) 20 MG tablet TAKE 1 TABLET(20 MG) BY MOUTH DAILY  . SUMAtriptan (IMITREX) 100 MG tablet   . triamterene-hydrochlorothiazide (MAXZIDE-25) 37.5-25 MG tablet TAKE 1 AND 1/2 TABLETS BY MOUTH DAILY    PHQ 2/9 Scores 07/09/2018 10/21/2017 07/12/2017 01/01/2017  PHQ - 2 Score 2 0 0  0  PHQ- 9 Score 14 0 1 0    BP Readings from Last 3 Encounters:  07/09/18 128/70  10/21/17 104/70  08/23/17 104/70    Physical Exam Vitals signs and nursing note reviewed.  Constitutional:      General: She is not in acute distress.    Appearance: She is well-developed.  HENT:     Head: Normocephalic and atraumatic.  Cardiovascular:     Rate and Rhythm: Normal rate and regular rhythm.     Heart sounds: Normal heart sounds.  Pulmonary:     Effort: Pulmonary effort is  normal. No respiratory distress.     Breath sounds: No wheezing or rhonchi.  Musculoskeletal: Normal range of motion.     Left ankle: Normal. She exhibits normal range of motion, no swelling and no ecchymosis.     Right lower leg: No edema.     Left lower leg: No edema.       Feet:  Skin:    General: Skin is warm and dry.     Findings: No rash.  Neurological:     Mental Status: She is alert and oriented to person, place, and time.     Sensory: Sensation is intact.  Psychiatric:        Behavior: Behavior normal.        Thought Content: Thought content normal.     Wt Readings from Last 3 Encounters:  07/09/18 191 lb (86.6 kg)  10/21/17 180 lb (81.6 kg)  08/23/17 176 lb (79.8 kg)    BP 128/70   Pulse 74   Ht 5\' 5"  (1.651 m)   Wt 191 lb (86.6 kg)   SpO2 97%   BMI 31.78 kg/m   Assessment and Plan: 1. Plantar fasciitis of left foot Recommend Ice massage If no improvement, will need podiatry referall - predniSONE (DELTASONE) 10 MG tablet; Take 1 tablet (10 mg total) by mouth as directed for 6 days. Take 6,5,4,3,2,1 then stop  Dispense: 21 tablet; Refill: 0  2. Depression, major, single episode, in partial remission ([place]) Pt feels that she is doing well on medication Current sx due to covid restrictions   Partially dictated using Editor, commissioning. Any errors are unintentional.  Halina Maidens, MD [facility]  07/09/2018

## 2018-07-09 NOTE — Patient Instructions (Signed)
Plantar Fasciitis      Plantar fasciitis is a painful foot condition that affects the heel. It occurs when the band of tissue that connects the toes to the heel bone (plantar fascia) becomes irritated. This can happen as the result of exercising too much or doing other repetitive activities (overuse injury).  The pain from plantar fasciitis can range from mild irritation to severe pain that makes it difficult to walk or move. The pain is usually worse in the morning after sleeping, or after sitting or lying down for a while. Pain may also be worse after long periods of walking or standing.  What are the causes?  This condition may be caused by:  Standing for long periods of time.  Wearing shoes that do not have good arch support.  Doing activities that put stress on joints (high-impact activities), including running, aerobics, and ballet.  Being overweight.  An abnormal way of walking (gait).  Tight muscles in the back of your lower leg (calf).  High arches in your feet.  Starting a new athletic activity.  What are the signs or symptoms?  The main symptom of this condition is heel pain. Pain may:  Be worse with first steps after a time of rest, especially in the morning after sleeping or after you have been sitting or lying down for a while.  Be worse after long periods of standing still.  Decrease after 30-45 minutes of activity, such as gentle walking.  How is this diagnosed?  This condition may be diagnosed based on your medical history and your symptoms. Your health care provider may ask questions about your activity level. Your health care provider will do a physical exam to check for:  A tender area on the bottom of your foot.  A high arch in your foot.  Pain when you move your foot.  Difficulty moving your foot.  You may have imaging tests to confirm the diagnosis, such as:  X-rays.  Ultrasound.  MRI.  How is this treated?  Treatment for plantar fasciitis depends on how severe your condition is.  Treatment may include:  Rest, ice, applying pressure (compression), and raising the affected foot (elevation). This may be called RICE therapy. Your health care provider may recommend RICE therapy along with over-the-counter pain medicines to manage your pain.  Exercises to stretch your calves and your plantar fascia.  A splint that holds your foot in a stretched, upward position while you sleep (night splint).  Physical therapy to relieve symptoms and prevent problems in the future.  Injections of steroid medicine (cortisone) to relieve pain and inflammation.  Stimulating your plantar fascia with electrical impulses (extracorporeal shock wave therapy). This is usually the last treatment option before surgery.  Surgery, if other treatments have not worked after 12 months.  Follow these instructions at home:      Managing pain, stiffness, and swelling  If directed, put ice on the painful area:  Put ice in a plastic bag, or use a frozen bottle of water.  Place a towel between your skin and the bag or bottle.  Roll the bottom of your foot over the bag or bottle.  Do this for 20 minutes, 2-3 times a day.  Wear athletic shoes that have air-sole or gel-sole cushions, or try wearing soft shoe inserts that are designed for plantar fasciitis.  Raise (elevate) your foot above the level of your heart while you are sitting or lying down.  Activity  Avoid activities that cause pain.   Ask your health care provider what activities are safe for you.  Do physical therapy exercises and stretches as told by your health care provider.  Try activities and forms of exercise that are easier on your joints (low-impact). Examples include swimming, water aerobics, and biking.  General instructions  Take over-the-counter and prescription medicines only as told by your health care provider.  Wear a night splint while sleeping, if told by your health care provider. Loosen the splint if your toes tingle, become numb, or turn cold and  blue.  Maintain a healthy weight, or work with your health care provider to lose weight as needed.  Keep all follow-up visits as told by your health care provider. This is important.  Contact a health care provider if you:  Have symptoms that do not go away after caring for yourself at home.  Have pain that gets worse.  Have pain that affects your ability to move or do your daily activities.  Summary  Plantar fasciitis is a painful foot condition that affects the heel. It occurs when the band of tissue that connects the toes to the heel bone (plantar fascia) becomes irritated.  The main symptom of this condition is heel pain that may be worse after exercising too much or standing still for a long time.  Treatment varies, but it usually starts with rest, ice, compression, and elevation (RICE therapy) and over-the-counter medicines to manage pain.  This information is not intended to replace advice given to you by your health care provider. Make sure you discuss any questions you have with your health care provider.  Document Released: 10/10/2000 Document Revised: 11/12/2016 Document Reviewed: 11/12/2016  Elsevier Interactive Patient Education © 2019 [employer]

## 2018-08-27 ENCOUNTER — Encounter: Payer: BC Managed Care – PPO | Admitting: Internal Medicine

## 2018-08-29 ENCOUNTER — Other Ambulatory Visit: Payer: Self-pay

## 2018-08-29 DIAGNOSIS — M47816 Spondylosis without myelopathy or radiculopathy, lumbar region: Secondary | ICD-10-CM

## 2018-08-29 MED ORDER — METHOCARBAMOL 500 MG PO TABS: 500.0000 mg | ORAL_TABLET | Freq: Four times a day (QID) | ORAL | 2 refills | Status: DC | PRN

## 2018-09-16 ENCOUNTER — Encounter: Payer: BC Managed Care – PPO | Admitting: Internal Medicine

## 2018-09-16 NOTE — Progress Notes (Deleted)
    Date:  09/16/2018   Name:  [name]   DOB:  [birth year]   MRN:  [id number]   Chief Complaint: No chief complaint on file. Jade Harris is a 53 y.o. female who presents today for her Complete Annual Exam. She feels {DESC; WELL/FAIRLY WELL/POORLY:18703}. She reports exercising ***. She reports she is sleeping {DESC; WELL/FAIRLY WELL/POORLY:18703}.   Mammogram  07/2017 Pap - discontinued Colonoscopy - pt cancelled and has not rescheduled  Hypertension Associated symptoms include headaches.  Depression        Associated symptoms include headaches. Headache  Her past medical history is significant for hypertension.    Review of Systems  Neurological: Positive for headaches.  Psychiatric/Behavioral: Positive for depression.    Patient Active Problem List   Diagnosis Date Noted  . Eczema 08/23/2017  . Essential hypertension 10/29/2016  . Headache, chronic daily 09/20/2015  . Dyslipidemia 07/04/2014  . Local edema 07/04/2014  . 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 Menopause 07/04/2014  . 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  . Idiopathic insomnia 07/04/2014    Allergies  Allergen Reactions  . Sulfa Antibiotics Other (See Comments)    Past Surgical History:  Procedure Laterality Date  . ABDOMINAL HYSTERECTOMY    . APPENDECTOMY    . MOUTH SURGERY    . NASAL SINUS SURGERY  04/2014  . TONSILLECTOMY    . TOTAL ABDOMINAL HYSTERECTOMY W/ BILATERAL SALPINGOOPHORECTOMY     wtih oophorectomy    Social History   Tobacco Use  . Smoking status: Former Research scientist (life sciences)  . Smokeless tobacco: Never Used  Substance Use Topics  . Alcohol use: Yes    Alcohol/week: 0.0 standard drinks    Comment: occasional  . Drug use: No     Medication list has been reviewed and updated.  No outpatient medications have been marked as taking for the 09/16/18 encounter (Appointment) with Glean Hess, MD.    [facility] 2/9 Scores  07/09/2018 10/21/2017 07/12/2017 01/01/2017  PHQ - 2 Score 2 0 0 0  PHQ- 9 Score 14 0 1 0    BP Readings from Last 3 Encounters:  07/09/18 128/70  10/21/17 104/70  08/23/17 104/70    Physical Exam  Wt Readings from Last 3 Encounters:  07/09/18 191 lb (86.6 kg)  10/21/17 180 lb (81.6 kg)  08/23/17 176 lb (79.8 kg)    There were no vitals taken for this visit.  Assessment and Plan:

## 2018-10-01 ENCOUNTER — Other Ambulatory Visit: Payer: Self-pay | Admitting: Internal Medicine

## 2018-10-28 ENCOUNTER — Other Ambulatory Visit: Payer: Self-pay | Admitting: Internal Medicine

## 2018-10-28 DIAGNOSIS — Z1231 Encounter for screening mammogram for malignant neoplasm of breast: Secondary | ICD-10-CM

## 2018-11-06 ENCOUNTER — Other Ambulatory Visit: Payer: Self-pay

## 2018-11-06 ENCOUNTER — Other Ambulatory Visit: Payer: Self-pay | Admitting: Internal Medicine

## 2018-11-06 ENCOUNTER — Ambulatory Visit
Admission: RE | Admit: 2018-11-06 | Discharge: 2018-11-06 | Disposition: A | Discharge status: Home / Self Care | Payer: BC Managed Care – PPO | Source: Ambulatory Visit | Attending: Internal Medicine | Admitting: Internal Medicine

## 2018-11-06 DIAGNOSIS — R928 Other abnormal and inconclusive findings on diagnostic imaging of breast: Secondary | ICD-10-CM

## 2018-11-06 DIAGNOSIS — Z1231 Encounter for screening mammogram for malignant neoplasm of breast: Secondary | ICD-10-CM | POA: Insufficient documentation

## 2018-11-07 ENCOUNTER — Encounter: Payer: Self-pay | Admitting: Internal Medicine

## 2018-11-07 ENCOUNTER — Ambulatory Visit (INDEPENDENT_AMBULATORY_CARE_PROVIDER_SITE_OTHER): Payer: BC Managed Care – PPO | Admitting: Internal Medicine

## 2018-11-07 VITALS — BP 124/78 | HR 80 | Ht 65.0 in | Wt 195.0 lb

## 2018-11-07 DIAGNOSIS — R519 Headache, unspecified: Secondary | ICD-10-CM

## 2018-11-07 DIAGNOSIS — Z1231 Encounter for screening mammogram for malignant neoplasm of breast: Secondary | ICD-10-CM | POA: Diagnosis not present

## 2018-11-07 DIAGNOSIS — Z1211 Encounter for screening for malignant neoplasm of colon: Secondary | ICD-10-CM | POA: Diagnosis not present

## 2018-11-07 DIAGNOSIS — I10 Essential (primary) hypertension: Secondary | ICD-10-CM

## 2018-11-07 DIAGNOSIS — F324 Major depressive disorder, single episode, in partial remission: Secondary | ICD-10-CM

## 2018-11-07 DIAGNOSIS — Z0000 Encounter for general adult medical examination without abnormal findings: Secondary | ICD-10-CM | POA: Diagnosis not present

## 2018-11-07 DIAGNOSIS — Z23 Encounter for immunization: Secondary | ICD-10-CM

## 2018-11-07 LAB — POCT URINALYSIS DIPSTICK
Bilirubin, UA: NEGATIVE
Blood, UA: NEGATIVE
Glucose, UA: NEGATIVE
Ketones, UA: NEGATIVE
Leukocytes, UA: NEGATIVE
Nitrite, UA: NEGATIVE
Protein, UA: NEGATIVE
Spec Grav, UA: 1.01 (ref 1.010–1.025)
Urobilinogen, UA: 0.2 E.U./dL
pH, UA: 6.5 (ref 5.0–8.0)

## 2018-11-07 MED ORDER — DULOXETINE HCL 60 MG PO CPEP: 60.0000 mg | ORAL_CAPSULE | Freq: Every day | ORAL | 3 refills | Status: DC

## 2018-11-07 NOTE — Progress Notes (Signed)
Date:  11/07/2018   Name:  Jade Harris   DOB:  1965-04-21   MRN:  [id number]   Chief Complaint: Annual Exam (Breast Exam.) and Depression (12- PHQ9) Jade Harris is a 53 y.o. female who presents today for her Complete Annual Exam. She feels fairly well. She reports exercising some. She reports she is sleeping fairly well.   Mammogram  10/2018 Colonoscopy - none despite several referrals  Hypertension This is a chronic problem. The problem is controlled. Associated symptoms include headaches. Pertinent negatives include no chest pain, palpitations or shortness of breath. Past treatments include diuretics. The current treatment provides significant improvement. There are no compliance problems.   Depression        This is a chronic problem.  The problem occurs daily.  The problem has been gradually worsening since onset.  Associated symptoms include decreased concentration, decreased interest, myalgias, headaches and sad.  Associated symptoms include no fatigue, does not have insomnia, not irritable, no restlessness, no appetite change and no suicidal ideas.     The symptoms are aggravated by social issues (covid 19).  Past treatments include SSRIs - Selective serotonin reuptake inhibitors.  Compliance with treatment is good.  Previous treatment provided moderate relief. Headache  This is a chronic problem. The problem occurs daily. The problem has been unchanged. The pain is located in the occipital region. Radiates to: radiated to forehead and left eye. The quality of the pain is described as aching and throbbing. Associated symptoms include back pain. Pertinent negatives include no abdominal pain, coughing, dizziness, fever, hearing loss, insomnia, tinnitus or vomiting. Nothing aggravates the symptoms. Her past medical history is significant for hypertension.   Lab Results  Component Value Date   CREATININE 0.85 08/23/2017   BUN 14 08/23/2017   NA 140 08/23/2017   K 4.6  08/23/2017   CL 97 08/23/2017   CO2 22 08/23/2017   Lab Results  Component Value Date   CHOL 253 (H) 08/23/2017   HDL 91 08/23/2017   LDLCALC 145 (H) 08/23/2017   TRIG 87 08/23/2017   CHOLHDL 2.8 08/23/2017     Review of Systems  Constitutional: Negative for appetite change, chills, fatigue and fever.  HENT: Negative for congestion, hearing loss, tinnitus, trouble swallowing and voice change.   Eyes: Negative for visual disturbance.  Respiratory: Negative for cough, chest tightness, shortness of breath and wheezing.   Cardiovascular: Negative for chest pain, palpitations and leg swelling.  Gastrointestinal: Negative for abdominal pain, constipation, diarrhea and vomiting.  Endocrine: Negative for polydipsia and polyuria.  Genitourinary: Negative for dysuria, frequency, genital sores, vaginal bleeding and vaginal discharge.  Musculoskeletal: Positive for back pain and myalgias. Negative for arthralgias, gait problem and joint swelling.  Skin: Negative for color change and rash.  Neurological: Positive for headaches. Negative for dizziness, tremors and light-headedness.  Hematological: Negative for adenopathy. Does not bruise/bleed easily.  Psychiatric/Behavioral: Positive for decreased concentration, depression and dysphoric mood. Negative for sleep disturbance and suicidal ideas. The patient is not nervous/anxious and does not have insomnia.     Patient Active Problem List   Diagnosis Date Noted  . Eczema 08/23/2017  . Essential hypertension 10/29/2016  . Headache, chronic daily 09/20/2015  . Dyslipidemia 07/04/2014  . Local edema 07/04/2014  . Depression, major, single episode, in partial remission ([place]) 07/04/2014  . Menopause 07/04/2014  . 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  . Idiopathic insomnia 07/04/2014    Allergies  Allergen Reactions  . Sulfa Antibiotics  Other (See Comments)    Past Surgical History:  Procedure Laterality Date  . ABDOMINAL  HYSTERECTOMY    . APPENDECTOMY    . MOUTH SURGERY    . NASAL SINUS SURGERY  04/2014  . TONSILLECTOMY    . TOTAL ABDOMINAL HYSTERECTOMY W/ BILATERAL SALPINGOOPHORECTOMY     wtih oophorectomy    Social History   Tobacco Use  . Smoking status: Former Research scientist (life sciences)  . Smokeless tobacco: Never Used  Substance Use Topics  . Alcohol use: Yes    Alcohol/week: 0.0 standard drinks    Comment: occasional  . Drug use: No     Medication list has been reviewed and updated.  Current Meds  Medication Sig  . Calcipotriene-Betameth Diprop (ENSTILAR) 0.005-0.064 % FOAM Apply topically.  Stasia Cavalier (EUCRISA) 2 % OINT Apply topically.  Marland Kitchen EPINEPHrine 0.3 mg/0.3 mL IJ SOAJ injection Inject as directed.  Marland Kitchen estradiol (ESTRACE) 1 MG tablet TAKE 1 TABLET BY MOUTH DAILY  . fluticasone (FLONASE) 50 MCG/ACT nasal spray Place 2 sprays into both nostrils daily. Reported on 03/30/2015  . methocarbamol (ROBAXIN) 500 MG tablet Take 1 tablet (500 mg total) by mouth every 6 (six) hours as needed for muscle spasms.  Marland Kitchen PARoxetine (PAXIL) 20 MG tablet TAKE 1 TABLET(20 MG) BY MOUTH DAILY  . SUMAtriptan (IMITREX) 100 MG tablet   . triamterene-hydrochlorothiazide (MAXZIDE-25) 37.5-25 MG tablet TAKE 1 AND 1/2 TABLETS BY MOUTH DAILY    PHQ 2/9 Scores 11/07/2018 07/09/2018 10/21/2017 07/12/2017  PHQ - 2 Score 4 2 0 0  PHQ- 9 Score 12 14 0 1    BP Readings from Last 3 Encounters:  11/07/18 124/78  07/09/18 128/70  10/21/17 104/70    Physical Exam Vitals signs and nursing note reviewed.  Constitutional:      General: She is not irritable.She is not in acute distress.    Appearance: She is well-developed.  HENT:     Head: Normocephalic and atraumatic.     Right Ear: Tympanic membrane and ear canal normal.     Left Ear: Tympanic membrane and ear canal normal.     Nose:     Right Sinus: No maxillary sinus tenderness.     Left Sinus: No maxillary sinus tenderness.  Eyes:     General: No scleral icterus.       Right  eye: No discharge.        Left eye: No discharge.     Conjunctiva/sclera: Conjunctivae normal.  Neck:     Musculoskeletal: Normal range of motion. No erythema.     Thyroid: No thyromegaly.     Vascular: No carotid bruit.  Cardiovascular:     Rate and Rhythm: Normal rate and regular rhythm.     Pulses: Normal pulses.     Heart sounds: Normal heart sounds.  Pulmonary:     Effort: Pulmonary effort is normal. No respiratory distress.     Breath sounds: No wheezing.  Chest:     Breasts:        Right: No mass, nipple discharge, skin change or tenderness.        Left: No mass, nipple discharge, skin change or tenderness.  Abdominal:     General: Bowel sounds are normal.     Palpations: Abdomen is soft.     Tenderness: There is no abdominal tenderness.  Musculoskeletal: Normal range of motion.     Right lower leg: No edema.     Left lower leg: No edema.  Lymphadenopathy:  Cervical: No cervical adenopathy.  Skin:    General: Skin is warm and dry.     Capillary Refill: Capillary refill takes less than 2 seconds.     Findings: No rash.  Neurological:     General: No focal deficit present.     Mental Status: She is alert and oriented to person, place, and time.     Cranial Nerves: No cranial nerve deficit.     Sensory: No sensory deficit.     Deep Tendon Reflexes: Reflexes are normal and symmetric.  Psychiatric:        Attention and Perception: Attention normal.        Mood and Affect: Mood is depressed.        Speech: Speech normal.        Behavior: Behavior normal.        Thought Content: Thought content normal. Thought content does not include suicidal ideation. Thought content does not include suicidal plan.        Cognition and Memory: Cognition normal.        Judgment: Judgment normal.     Wt Readings from Last 3 Encounters:  11/07/18 195 lb (88.5 kg)  07/09/18 191 lb (86.6 kg)  10/21/17 180 lb (81.6 kg)    BP 124/78   Pulse 80   Ht 5\' 5"  (1.651 m)   Wt 195 lb  (88.5 kg)   SpO2 97%   BMI 32.45 kg/m   Assessment and Plan: 1. Annual physical exam Normal exam Recommend regular exercise for general health and weight control - Lipid panel  2. Encounter for screening mammogram for breast cancer Done yesterday - right sided distortion needs additional views  3. Essential hypertension Clinically stable exam with well controlled BP.   Tolerating medications, HCTZ 25 mg, without side effects at this time. Pt to continue current regimen and low sodium diet; benefits of regular exercise as able discussed. - CBC with Differential/Platelet - Comprehensive metabolic panel - TSH  4. Depression, major, single episode, in partial remission ([place]) Stop Paxil due to worsening sx but without SI/HI Begin cymbalta 60 to help with mood, apathy and possibly attenuated pain from chronic headache and back pain - DULoxetine (CYMBALTA) 60 MG capsule; Take 1 capsule (60 mg total) by mouth daily.  Dispense: 30 capsule; Refill: 3  5. Colon cancer screening - Fecal occult blood, imunochemical   Partially dictated using Editor, commissioning. Any errors are unintentional.  Halina Maidens, MD [facility]  11/07/2018

## 2018-11-08 LAB — CBC WITH DIFFERENTIAL/PLATELET
Basophils Absolute: 0.1 10*3/uL (ref 0.0–0.2)
Basos: 1 %
EOS (ABSOLUTE): 0 10*3/uL (ref 0.0–0.4)
Eos: 0 %
Hematocrit: 41.4 % (ref 34.0–46.6)
Hemoglobin: 14.2 g/dL (ref 11.1–15.9)
Immature Grans (Abs): 0 10*3/uL (ref 0.0–0.1)
Immature Granulocytes: 0 %
Lymphocytes Absolute: 1.7 10*3/uL (ref 0.7–3.1)
Lymphs: 25 %
MCH: 30.8 pg (ref 26.6–33.0)
MCHC: 34.3 g/dL (ref 31.5–35.7)
MCV: 90 fL (ref 79–97)
Monocytes Absolute: 0.5 10*3/uL (ref 0.1–0.9)
Monocytes: 7 %
Neutrophils Absolute: 4.5 10*3/uL (ref 1.4–7.0)
Neutrophils: 67 %
Platelets: 277 10*3/uL (ref 150–450)
RBC: 4.61 x10E6/uL (ref 3.77–5.28)
RDW: 12.1 % (ref 11.7–15.4)
WBC: 6.8 10*3/uL (ref 3.4–10.8)

## 2018-11-08 LAB — COMPREHENSIVE METABOLIC PANEL
ALT: 12 IU/L (ref 0–32)
AST: 14 IU/L (ref 0–40)
Albumin/Globulin Ratio: 1.6 (ref 1.2–2.2)
Albumin: 4.6 g/dL (ref 3.8–4.9)
Alkaline Phosphatase: 58 IU/L (ref 39–117)
BUN/Creatinine Ratio: 20 (ref 9–23)
BUN: 15 mg/dL (ref 6–24)
Bilirubin Total: 0.3 mg/dL (ref 0.0–1.2)
CO2: 23 mmol/L (ref 20–29)
Calcium: 9.7 mg/dL (ref 8.7–10.2)
Chloride: 98 mmol/L (ref 96–106)
Creatinine, Ser: 0.76 mg/dL (ref 0.57–1.00)
GFR calc Af Amer: 102 mL/min/{1.73_m2} (ref >59)
GFR calc non Af Amer: 89 mL/min/{1.73_m2} (ref >59)
Globulin, Total: 2.8 g/dL (ref 1.5–4.5)
Glucose: 89 mg/dL (ref 65–99)
Potassium: 4.3 mmol/L (ref 3.5–5.2)
Sodium: 137 mmol/L (ref 134–144)
Total Protein: 7.4 g/dL (ref 6.0–8.5)

## 2018-11-08 LAB — LIPID PANEL
Chol/HDL Ratio: 2.9 ratio (ref 0.0–4.4)
Cholesterol, Total: 247 mg/dL — ABNORMAL HIGH (ref 100–199)
HDL: 86 mg/dL (ref >39)
LDL Chol Calc (NIH): 129 mg/dL — ABNORMAL HIGH (ref 0–99)
Triglycerides: 183 mg/dL — ABNORMAL HIGH (ref 0–149)
VLDL Cholesterol Cal: 32 mg/dL (ref 5–40)

## 2018-11-08 LAB — TSH: TSH: 1.38 u[IU]/mL (ref 0.450–4.500)

## 2018-11-20 ENCOUNTER — Ambulatory Visit
Admission: RE | Admit: 2018-11-20 | Discharge: 2018-11-20 | Disposition: A | Discharge status: Home / Self Care | Payer: BC Managed Care – PPO | Source: Ambulatory Visit | Attending: Internal Medicine | Admitting: Internal Medicine

## 2018-11-20 DIAGNOSIS — R928 Other abnormal and inconclusive findings on diagnostic imaging of breast: Secondary | ICD-10-CM | POA: Insufficient documentation

## 2018-11-25 ENCOUNTER — Other Ambulatory Visit: Payer: Self-pay

## 2018-11-25 ENCOUNTER — Encounter: Payer: Self-pay | Admitting: Internal Medicine

## 2018-11-25 ENCOUNTER — Ambulatory Visit: Payer: BC Managed Care – PPO | Admitting: Internal Medicine

## 2018-11-25 VITALS — BP 128/74 | HR 77 | Ht 65.0 in | Wt 197.0 lb

## 2018-11-25 DIAGNOSIS — I872 Venous insufficiency (chronic) (peripheral): Secondary | ICD-10-CM | POA: Insufficient documentation

## 2018-11-25 DIAGNOSIS — K5901 Slow transit constipation: Secondary | ICD-10-CM | POA: Diagnosis not present

## 2018-11-25 DIAGNOSIS — F324 Major depressive disorder, single episode, in partial remission: Secondary | ICD-10-CM | POA: Diagnosis not present

## 2018-11-25 MED ORDER — FUROSEMIDE 20 MG PO TABS: 20.0000 mg | ORAL_TABLET | Freq: Every day | ORAL | 0 refills | Status: DC | PRN

## 2018-11-25 NOTE — Progress Notes (Signed)
Date:  11/25/2018   Name:  Jade Harris   DOB:  1965-06-22   MRN:  [id number]   Chief Complaint: Edema (Started swelling back Sunday. Yesterday was the worst. When standing she could feel numbness and tingling because ther swelling. No pain. Todya is better because she slept in recliner with feet elevated at bed last night. )  Constipation This is a recurrent problem. The problem is unchanged. Her stool frequency is 1 time per week or less. The stool is described as pellet like and formed. The patient is not on a high fiber diet. She exercises regularly. There has been adequate water intake. Pertinent negatives include no fever or nausea. Risk factors include change in medication usage/dosage. She has tried nothing for the symptoms.  Depression        This is a chronic problem.  The problem has been gradually improving (she felt much better the first week on Cymbalta with less pain, more energy and less depressive sx.  this week has not been as good.) since onset.  Associated symptoms include no headaches. Edema - pt has mild baseline lower leg edema that she manages with HCTZ.  2 days ago the edema became much worse.  She doubled up on hctz and elevated over night with marked improvement.  She denies change in diet or salt intact.  No leg injury or significant pain.  No SOB or CP.  Only med change is from Paxil to Cymbalta 2 weeks ago.  Review of Systems  Constitutional: Negative for chills, diaphoresis, fever and unexpected weight change.  Respiratory: Negative for cough, chest tightness and shortness of breath.   Cardiovascular: Positive for leg swelling. Negative for chest pain and palpitations.  Gastrointestinal: Positive for abdominal distention and constipation. Negative for anal bleeding, blood in stool and nausea.  Neurological: Negative for dizziness, light-headedness and headaches.  Psychiatric/Behavioral: Positive for depression.    Patient Active Problem List   Diagnosis  Date Noted  . Eczema 08/23/2017  . Essential hypertension 10/29/2016  . Headache, chronic daily 09/20/2015  . Dyslipidemia 07/04/2014  . Local edema 07/04/2014  . Depression, major, single episode, in partial remission ([place]) 07/04/2014  . Menopause 07/04/2014  . 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  . Idiopathic insomnia 07/04/2014    Allergies  Allergen Reactions  . Sulfa Antibiotics Other (See Comments)    Past Surgical History:  Procedure Laterality Date  . ABDOMINAL HYSTERECTOMY    . APPENDECTOMY    . MOUTH SURGERY    . NASAL SINUS SURGERY  04/2014  . TONSILLECTOMY    . TOTAL ABDOMINAL HYSTERECTOMY W/ BILATERAL SALPINGOOPHORECTOMY     wtih oophorectomy    Social History   Tobacco Use  . Smoking status: Former Research scientist (life sciences)  . Smokeless tobacco: Never Used  Substance Use Topics  . Alcohol use: Yes    Alcohol/week: 0.0 standard drinks    Comment: occasional  . Drug use: No     Medication list has been reviewed and updated.  Current Meds  Medication Sig  . Calcipotriene-Betameth Diprop (ENSTILAR) 0.005-0.064 % FOAM Apply topically.  Stasia Cavalier (EUCRISA) 2 % OINT Apply topically.  . DULoxetine (CYMBALTA) 60 MG capsule Take 1 capsule (60 mg total) by mouth daily.  Marland Kitchen EPINEPHrine 0.3 mg/0.3 mL IJ SOAJ injection Inject as directed.  Marland Kitchen estradiol (ESTRACE) 1 MG tablet TAKE 1 TABLET BY MOUTH DAILY  . fluticasone (FLONASE) 50 MCG/ACT nasal spray Place 2 sprays into both nostrils daily. Reported on 03/30/2015  .  methocarbamol (ROBAXIN) 500 MG tablet Take 1 tablet (500 mg total) by mouth every 6 (six) hours as needed for muscle spasms.  . SUMAtriptan (IMITREX) 100 MG tablet   . triamterene-hydrochlorothiazide (MAXZIDE-25) 37.5-25 MG tablet TAKE 1 AND 1/2 TABLETS BY MOUTH DAILY    PHQ 2/9 Scores 11/07/2018 07/09/2018 10/21/2017 07/12/2017  PHQ - 2 Score 4 2 0 0  PHQ- 9 Score 12 14 0 1    BP Readings from Last 3 Encounters:  11/25/18 128/74  11/07/18 124/78   07/09/18 128/70    Physical Exam Vitals signs and nursing note reviewed.  Constitutional:      General: She is not in acute distress.    Appearance: She is well-developed.  HENT:     Head: Normocephalic and atraumatic.  Neck:     Musculoskeletal: Normal range of motion and neck supple.  Cardiovascular:     Rate and Rhythm: Normal rate and regular rhythm.     Pulses: Normal pulses.     Heart sounds: No murmur.  Pulmonary:     Effort: Pulmonary effort is normal. No respiratory distress.  Abdominal:     General: There is distension.     Palpations: Abdomen is soft.     Tenderness: There is abdominal tenderness (RLQ).  Musculoskeletal: Normal range of motion.        General: No swelling (no calf swelling) or tenderness.     Right lower leg: Edema (trace pitting edema mid tibia) present.     Left lower leg: Edema (trace pitting edema mid tibia) present.  Skin:    General: Skin is warm and dry.     Capillary Refill: Capillary refill takes less than 2 seconds.     Findings: No rash.  Neurological:     General: No focal deficit present.     Mental Status: She is alert and oriented to person, place, and time.  Psychiatric:        Behavior: Behavior normal.        Thought Content: Thought content normal.     Wt Readings from Last 3 Encounters:  11/25/18 197 lb (89.4 kg)  11/07/18 195 lb (88.5 kg)  07/09/18 191 lb (86.6 kg)    BP 128/74   Pulse 77   Ht 5\' 5"  (1.651 m)   Wt 197 lb (89.4 kg)   SpO2 97%   BMI 32.78 kg/m   Assessment and Plan: 1. Venous insufficiency of both lower extremities Continue to limit sodium, increase water, and elevate May need to wear compression stockings Use lasix only PRN for severe edema - furosemide (LASIX) 20 MG tablet; Take 1 tablet (20 mg total) by mouth daily as needed.  Dispense: 30 tablet; Refill: 0  2. Depression, major, single episode, in partial remission ([place]) Initially improved with change to Cymbalta.  She experienced less  pain and more energy.   Continue current dose - follow up in several weeks by phone  3. Slow transit constipation Could be a side effect of Cymbalta Recommend dulcolax initially then begin regimen of Miralax daily - bid to regulate stools   Partially dictated using Editor, commissioning. Any errors are unintentional.  Halina Maidens, MD [facility]  11/25/2018

## 2018-11-25 NOTE — Patient Instructions (Signed)
Dulcolax laxative - initial treatment the begin Miralax powder and dose 1-2 times per day until you become regulated.

## 2018-12-29 ENCOUNTER — Other Ambulatory Visit: Payer: Self-pay | Admitting: Internal Medicine

## 2018-12-29 DIAGNOSIS — I872 Venous insufficiency (chronic) (peripheral): Secondary | ICD-10-CM

## 2019-01-21 ENCOUNTER — Other Ambulatory Visit: Payer: Self-pay | Admitting: Internal Medicine

## 2019-01-21 DIAGNOSIS — F324 Major depressive disorder, single episode, in partial remission: Secondary | ICD-10-CM

## 2019-01-21 MED ORDER — DULOXETINE HCL 30 MG PO CPEP: 90.0000 mg | ORAL_CAPSULE | Freq: Every day | ORAL | 2 refills | Status: DC

## 2019-01-27 ENCOUNTER — Ambulatory Visit: Payer: BC Managed Care – PPO | Attending: Internal Medicine

## 2019-02-23 ENCOUNTER — Other Ambulatory Visit: Payer: Self-pay

## 2019-02-23 ENCOUNTER — Encounter: Payer: Self-pay | Admitting: Internal Medicine

## 2019-02-23 ENCOUNTER — Ambulatory Visit (INDEPENDENT_AMBULATORY_CARE_PROVIDER_SITE_OTHER): Payer: BC Managed Care – PPO | Admitting: Internal Medicine

## 2019-02-23 VITALS — Temp 96.0°F | Ht 65.0 in | Wt 197.0 lb

## 2019-02-23 DIAGNOSIS — Z20822 Contact with and (suspected) exposure to covid-19: Secondary | ICD-10-CM | POA: Diagnosis not present

## 2019-02-23 NOTE — Progress Notes (Signed)
Date:  02/23/2019   Name:  Jade Harris   DOB:  1965/09/02   MRN:  [id number]  I connected with this patient, Jade Harris, by telephone at the patient's home.  I verified that I am speaking with the correct person using two identifiers. This visit was conducted via telephone due to the Covid-19 outbreak from my office at [facility] in [place], [place]. I discussed the limitations, risks, security and privacy concerns of performing an evaluation and management service by telephone. I also discussed with the patient that there may be a patient responsible charge related to this service. The patient expressed understanding and agreed to proceed.  Chief Complaint: Sore Throat (Sore throat, body aches and fatigue. X 2 weeks. Was tested for [facility] at [facility] 2 weeks ago Tuesday- Negative. Was tested in the beginning of her symptoms 3 days after it started. Vomitted the 2nd day but has not since. Having diarrhea, and nausea. No loss of taste or smell. No fever. Off and on symptoms in all. )  Sore Throat  This is a new problem. The current episode started 1 to 4 weeks ago (2 weeks ago - had 2 days of n/v, fatigue.  Did [facility] test on the third day and was negative.). There has been no fever. Associated symptoms include congestion, coughing, diarrhea, headaches, shortness of breath and vomiting. Pertinent negatives include no abdominal pain, swollen glands or trouble swallowing. Associated symptoms comments: No loss of taste or smell No sinus pain or pressure. She has tried acetaminophen for the symptoms. The treatment provided mild relief.  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  She does not think she can go back to work in person due to her symptoms of possible covid.  Lab Results  Component Value Date   CREATININE 0.76 11/07/2018   BUN 15 11/07/2018   NA 137 11/07/2018   K 4.3 11/07/2018   CL 98 11/07/2018   CO2 23 11/07/2018   Lab Results  Component Value Date   CHOL 247 (H) 11/07/2018   HDL 86 11/07/2018   LDLCALC 129 (H) 11/07/2018   TRIG 183 (H) 11/07/2018   CHOLHDL 2.9 11/07/2018   Lab Results  Component Value Date   TSH 1.380 11/07/2018   No results found for: HGBA1C   Review of Systems  Constitutional: Positive for fatigue. Negative for chills and fever.  HENT: Positive for congestion and sore throat. Negative for sinus pain and trouble swallowing.   Respiratory: Positive for cough and shortness of breath. Negative for wheezing.   Cardiovascular: Negative for chest pain, palpitations and leg swelling.  Gastrointestinal: Positive for diarrhea and vomiting. Negative for abdominal pain.  Neurological: Positive for headaches. Negative for dizziness and light-headedness.    Patient Active Problem List   Diagnosis Date Noted  . Slow transit constipation 11/25/2018  . Venous insufficiency of both lower extremities 11/25/2018  . Eczema 08/23/2017  . Essential hypertension 10/29/2016  . Headache, chronic daily 09/20/2015  . Dyslipidemia 07/04/2014  . Depression, major, single episode, in partial remission ([place]) 07/04/2014  . Menopause 07/04/2014  . 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  . Idiopathic insomnia 07/04/2014    Allergies  Allergen Reactions  . Sulfa Antibiotics Other (See Comments)    Past Surgical History:  Procedure Laterality Date  . ABDOMINAL HYSTERECTOMY    . APPENDECTOMY    . MOUTH SURGERY    . NASAL SINUS SURGERY  04/2014  . TONSILLECTOMY    .  TOTAL ABDOMINAL HYSTERECTOMY W/ BILATERAL SALPINGOOPHORECTOMY     wtih oophorectomy    Social History   Tobacco Use  . Smoking status: Former Research scientist (life sciences)  . Smokeless tobacco: Never Used  Substance Use Topics  . Alcohol use: Yes    Alcohol/week: 0.0 standard drinks    Comment: occasional  . Drug use: No     Medication list has been reviewed and updated.  Current Meds  Medication Sig  . Calcipotriene-Betameth Diprop (ENSTILAR) 0.005-0.064 % FOAM  Apply topically.  Stasia Cavalier (EUCRISA) 2 % OINT Apply topically.  . DULoxetine (CYMBALTA) 30 MG capsule Take 3 capsules (90 mg total) by mouth daily.  Marland Kitchen EPINEPHrine 0.3 mg/0.3 mL IJ SOAJ injection Inject as directed.  Marland Kitchen estradiol (ESTRACE) 1 MG tablet TAKE 1 TABLET BY MOUTH DAILY  . fluticasone (FLONASE) 50 MCG/ACT nasal spray Place 2 sprays into both nostrils daily. Reported on 03/30/2015  . furosemide (LASIX) 20 MG tablet TAKE 1 TABLET(20 MG) BY MOUTH DAILY AS NEEDED  . methocarbamol (ROBAXIN) 500 MG tablet Take 1 tablet (500 mg total) by mouth every 6 (six) hours as needed for muscle spasms.  . SUMAtriptan (IMITREX) 100 MG tablet   . triamterene-hydrochlorothiazide (MAXZIDE-25) 37.5-25 MG tablet TAKE 1 AND 1/2 TABLETS BY MOUTH DAILY    PHQ 2/9 Scores 02/23/2019 02/23/2019 11/07/2018 07/09/2018  PHQ - 2 Score 0 0 4 2  PHQ- 9 Score 0 - 12 14    BP Readings from Last 3 Encounters:  11/25/18 128/74  11/07/18 124/78  07/09/18 128/70    Physical Exam Pulmonary:     Effort: Pulmonary effort is normal.     Comments: No cough or dyspnea noted during the call Neurological:     Mental Status: She is alert.  Psychiatric:        Attention and Perception: Attention normal.        Mood and Affect: Mood normal.        Speech: Speech normal.        Cognition and Memory: Cognition normal.     Wt Readings from Last 3 Encounters:  02/23/19 197 lb (89.4 kg)  11/25/18 197 lb (89.4 kg)  11/07/18 195 lb (88.5 kg)    Temp (!) 96 F (35.6 C) (Oral)   Ht 5\' 5"  (1.651 m)   Wt 197 lb (89.4 kg)   BMI 32.78 kg/m   Assessment and Plan: 1. Suspected COVID-19 virus infection She has cough, shortness of breath, fatigue and sore throat - tested negative by self swab early in the course. 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 Treatment remains supportive at this time - fluids, tylenol and rest   Partially dictated using Editor, commissioning. Any errors are unintentional.  Halina Maidens,  MD [employer] [facility]  02/23/2019

## 2019-02-27 ENCOUNTER — Other Ambulatory Visit: Payer: Self-pay | Admitting: Internal Medicine

## 2019-02-27 ENCOUNTER — Telehealth: Payer: Self-pay

## 2019-02-27 DIAGNOSIS — J019 Acute sinusitis, unspecified: Secondary | ICD-10-CM

## 2019-02-27 MED ORDER — AMOXICILLIN-POT CLAVULANATE 875-125 MG PO TABS: 1.0000 | ORAL_TABLET | Freq: Two times a day (BID) | ORAL | 0 refills | Status: AC

## 2019-02-27 MED ORDER — ONDANSETRON HCL 8 MG PO TABS: 8.0000 mg | ORAL_TABLET | Freq: Three times a day (TID) | ORAL | 0 refills | Status: DC | PRN

## 2019-02-27 NOTE — Telephone Encounter (Signed)
Patient informed. Said she doesn't understand why she has been sick for 3 weeks but is hoping to feel better soon. Will take medication and call if no better after finishing abx.

## 2019-02-27 NOTE — Telephone Encounter (Signed)
Patient called saying she tested NEG for Covid and still feels sick. Wants to know be prescribed something to give her relief.  Still having fatigue, eyes watering, face stuffed up this morning ( left side ). Thinks she may have sinus infection. Periodic nausea but no more diarrhea, or vomiting.  Can she try nausea meds and abx?  [facility] in [place] is her pharmacy.

## 2019-02-27 NOTE — Telephone Encounter (Signed)
Sent to [employer]

## 2019-03-12 ENCOUNTER — Telehealth: Payer: Self-pay | Admitting: Internal Medicine

## 2019-03-12 ENCOUNTER — Other Ambulatory Visit: Payer: Self-pay

## 2019-03-12 MED ORDER — FLUCONAZOLE 150 MG PO TABS: 150.0000 mg | ORAL_TABLET | Freq: Once | ORAL | 0 refills | Status: AC

## 2019-03-12 MED ORDER — FLUTICASONE PROPIONATE 50 MCG/ACT NA SUSP: 2.0000 | Freq: Every day | NASAL | 5 refills | Status: DC

## 2019-03-12 NOTE — Telephone Encounter (Signed)
If she is not improved after the antibiotics, she may need to be seen in person at the [facility] or the [facility] in [place].  She had a negative covid test about a month ago but was not tested again after her virtual visit.

## 2019-03-12 NOTE — Telephone Encounter (Signed)
Jade Harris was seen in the clinic on 02/27/2019 and was  prescribed amoxicillin-clavulanate (AUGMENTIN), she finished the medicine but is still having symptoms. Patient is requesting something else to help with symptoms.  [facility] [place], [address] [facility] [address]  [place], [place] [address]  Phone:  [phone] Fax:  [phone]

## 2019-03-12 NOTE — Telephone Encounter (Signed)
Patient has been better. Woke up with headache and sinus pressure today but no yellow or green production or cough or fever.  Patient does have a yeast infection from the amoxicillin. Sent in diflucan and flonase nasal spray per Dr Army Melia and patient will pick up sinus sudafed OTC to take.   Will call if not getting better.   CM

## 2019-03-12 NOTE — Telephone Encounter (Signed)
Please advise. Patient still having symptoms.

## 2019-05-19 ENCOUNTER — Other Ambulatory Visit: Payer: Self-pay

## 2019-05-19 DIAGNOSIS — Z1211 Encounter for screening for malignant neoplasm of colon: Secondary | ICD-10-CM

## 2019-05-30 ENCOUNTER — Other Ambulatory Visit: Payer: Self-pay | Admitting: Internal Medicine

## 2019-05-30 DIAGNOSIS — F324 Major depressive disorder, single episode, in partial remission: Secondary | ICD-10-CM

## 2019-06-24 ENCOUNTER — Other Ambulatory Visit: Payer: Self-pay | Admitting: Internal Medicine

## 2019-06-25 ENCOUNTER — Encounter: Payer: Self-pay | Admitting: Internal Medicine

## 2019-06-25 ENCOUNTER — Ambulatory Visit: Payer: BC Managed Care – PPO | Admitting: Internal Medicine

## 2019-06-25 ENCOUNTER — Other Ambulatory Visit: Payer: Self-pay

## 2019-06-25 VITALS — BP 128/70 | HR 84 | Temp 97.8°F | Ht 65.0 in | Wt 211.0 lb

## 2019-06-25 DIAGNOSIS — M47816 Spondylosis without myelopathy or radiculopathy, lumbar region: Secondary | ICD-10-CM | POA: Diagnosis not present

## 2019-06-25 DIAGNOSIS — I872 Venous insufficiency (chronic) (peripheral): Secondary | ICD-10-CM

## 2019-06-25 DIAGNOSIS — J0100 Acute maxillary sinusitis, unspecified: Secondary | ICD-10-CM | POA: Diagnosis not present

## 2019-06-25 MED ORDER — CYCLOBENZAPRINE HCL 10 MG PO TABS: 10.0000 mg | ORAL_TABLET | Freq: Three times a day (TID) | ORAL | 0 refills | Status: DC | PRN

## 2019-06-25 MED ORDER — AZITHROMYCIN 250 MG PO TABS: ORAL_TABLET | ORAL | 0 refills | Status: AC

## 2019-06-25 NOTE — Progress Notes (Signed)
Date:  06/25/2019   Name:  Jade Harris   DOB:  12-07-1965   MRN:  [id number]   Chief Complaint: Sinusitis (x2-3 days head and teeth and face hurts, drainage in nose and throat, no mucous, scratchy ), Back Pain (Robaxin not covered under insurance. ), and Edema (Still retaining fluid even on lasix and hctz. )  Sinusitis This is a new problem. The current episode started in the past 7 days. There has been no fever. The pain is mild. Associated symptoms include congestion, headaches, sinus pressure and a sore throat. Pertinent negatives include no chills, coughing or shortness of breath.  Back Pain This is a chronic problem. The problem occurs constantly. The problem is unchanged. The pain is present in the lumbar spine. Associated symptoms include headaches. Pertinent negatives include no abdominal pain, chest pain or fever. She has tried muscle relaxant, NSAIDs, heat and chiropractic manipulation (planning nerve ablation in the near future) for the symptoms. Improvement on treatment: robaxin not covered by insurance.  Peripheral edema - continues despite hctz and PRN lasix.   Lab Results  Component Value Date   CREATININE 0.76 11/07/2018   BUN 15 11/07/2018   NA 137 11/07/2018   K 4.3 11/07/2018   CL 98 11/07/2018   CO2 23 11/07/2018   Lab Results  Component Value Date   CHOL 247 (H) 11/07/2018   HDL 86 11/07/2018   LDLCALC 129 (H) 11/07/2018   TRIG 183 (H) 11/07/2018   CHOLHDL 2.9 11/07/2018   Lab Results  Component Value Date   TSH 1.380 11/07/2018   No results found for: HGBA1C Lab Results  Component Value Date   WBC 6.8 11/07/2018   HGB 14.2 11/07/2018   HCT 41.4 11/07/2018   MCV 90 11/07/2018   PLT 277 11/07/2018   Lab Results  Component Value Date   ALT 12 11/07/2018   AST 14 11/07/2018   ALKPHOS 58 11/07/2018   BILITOT 0.3 11/07/2018     Review of Systems  Constitutional: Negative for chills, fatigue and fever.  HENT: Positive for congestion,  sinus pressure and sore throat.   Respiratory: Negative for cough and shortness of breath.   Cardiovascular: Positive for leg swelling. Negative for chest pain and palpitations.  Gastrointestinal: Negative for abdominal pain.  Musculoskeletal: Positive for back pain. Negative for joint swelling and myalgias.  Neurological: Positive for headaches. Negative for dizziness and light-headedness.    Patient Active Problem List   Diagnosis Date Noted  . Slow transit constipation 11/25/2018  . Venous insufficiency of both lower extremities 11/25/2018  . Eczema 08/23/2017  . Essential hypertension 10/29/2016  . Headache, chronic daily 09/20/2015  . Dyslipidemia 07/04/2014  . Depression, major, single episode, in partial remission ([place]) 07/04/2014  . Menopause 07/04/2014  . 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  . Idiopathic insomnia 07/04/2014    Allergies  Allergen Reactions  . Sulfa Antibiotics Other (See Comments)    Past Surgical History:  Procedure Laterality Date  . ABDOMINAL HYSTERECTOMY    . APPENDECTOMY    . MOUTH SURGERY    . NASAL SINUS SURGERY  04/2014  . TONSILLECTOMY    . TOTAL ABDOMINAL HYSTERECTOMY W/ BILATERAL SALPINGOOPHORECTOMY     wtih oophorectomy    Social History   Tobacco Use  . Smoking status: Former Research scientist (life sciences)  . Smokeless tobacco: Never Used  Substance Use Topics  . Alcohol use: Yes    Alcohol/week: 0.0 standard drinks    Comment: occasional  .  Drug use: No     Medication list has been reviewed and updated.  Current Meds  Medication Sig  . Calcipotriene-Betameth Diprop (ENSTILAR) 0.005-0.064 % FOAM Apply topically. Eczema  . Crisaborole (EUCRISA) 2 % OINT Apply topically.  . DULoxetine (CYMBALTA) 30 MG capsule TAKE 3 CAPSULES(90 MG) BY MOUTH DAILY  . estradiol (ESTRACE) 1 MG tablet TAKE 1 TABLET BY MOUTH DAILY  . fluticasone (FLONASE) 50 MCG/ACT nasal spray Place 2 sprays into both nostrils daily. Reported on 03/30/2015  . furosemide  (LASIX) 20 MG tablet TAKE 1 TABLET(20 MG) BY MOUTH DAILY AS NEEDED  . ondansetron (ZOFRAN) 8 MG tablet Take 1 tablet (8 mg total) by mouth every 8 (eight) hours as needed for nausea or vomiting.  . SUMAtriptan (IMITREX) 100 MG tablet   . triamterene-hydrochlorothiazide (MAXZIDE-25) 37.5-25 MG tablet TAKE 1 AND 1/2 TABLETS BY MOUTH DAILY    PHQ 2/9 Scores 06/25/2019 02/23/2019 02/23/2019 11/07/2018  PHQ - 2 Score 0 0 0 4  PHQ- 9 Score 0 0 - 12    BP Readings from Last 3 Encounters:  06/25/19 128/70  11/25/18 128/74  11/07/18 124/78    Physical Exam Vitals and nursing note reviewed.  Constitutional:      General: She is not in acute distress.    Appearance: Normal appearance. She is well-developed.  HENT:     Head: Normocephalic and atraumatic.     Right Ear: Ear canal and external ear normal. Tympanic membrane is not erythematous or retracted.     Left Ear: Ear canal and external ear normal. Tympanic membrane is not erythematous or retracted.     Nose:     Right Sinus: Maxillary sinus tenderness and frontal sinus tenderness present.     Left Sinus: Maxillary sinus tenderness and frontal sinus tenderness present.     Mouth/Throat:     Mouth: No oral lesions.     Pharynx: Uvula midline. No oropharyngeal exudate or posterior oropharyngeal erythema.  Cardiovascular:     Rate and Rhythm: Normal rate and regular rhythm.     Heart sounds: Normal heart sounds.  Pulmonary:     Effort: Pulmonary effort is normal. No respiratory distress.     Breath sounds: Normal breath sounds. No wheezing or rales.  Musculoskeletal:        General: Normal range of motion.     Cervical back: Normal range of motion.     Right lower leg: 1+ Edema present.     Left lower leg: 1+ Edema present.  Lymphadenopathy:     Cervical: No cervical adenopathy.  Skin:    General: Skin is warm and dry.     Findings: No rash.  Neurological:     Mental Status: She is alert and oriented to person, place, and time.    Psychiatric:        Behavior: Behavior normal.        Thought Content: Thought content normal.     Wt Readings from Last 3 Encounters:  06/25/19 211 lb (95.7 kg)  02/23/19 197 lb (89.4 kg)  11/25/18 197 lb (89.4 kg)    BP 128/70   Pulse 84   Temp 97.8 F (36.6 C) (Oral)   Ht 5\' 5"  (1.651 m)   Wt 211 lb (95.7 kg)   SpO2 96%   BMI 35.11 kg/m   Assessment and Plan: 1. Acute non-recurrent maxillary sinusitis Continue flonase spray May benefit from Allegra or Claritin - azithromycin (ZITHROMAX Z-PAK) 250 MG tablet; UAD  Dispense: 6  each; Refill: 0  2. Spondylosis of lumbar region without myelopathy or radiculopathy Being treated by Ortho with interventions Will send Rx for flexeril since robaxin is not covered - cyclobenzaprine (FLEXERIL) 10 MG tablet; Take 1 tablet (10 mg total) by mouth 3 (three) times daily as needed for muscle spasms.  Dispense: 30 tablet; Refill: 0  3. Venous insufficiency of both lower extremities Has not responded to diuretics Pt is apparently not using compression stockings She defers VS referral at this time Continue diuretics and elevation   Partially dictated using Editor, commissioning. Any errors are unintentional.  Halina Maidens, MD [facility]  06/25/2019

## 2019-06-25 NOTE — Patient Instructions (Signed)
Add Claritin or Allegra to help with post nasal drip.  Continue the nasal spray.

## 2019-06-26 ENCOUNTER — Other Ambulatory Visit: Payer: Self-pay | Admitting: Internal Medicine

## 2019-06-26 ENCOUNTER — Telehealth: Payer: Self-pay

## 2019-06-26 DIAGNOSIS — R059 Cough, unspecified: Secondary | ICD-10-CM

## 2019-06-26 MED ORDER — GUAIFENESIN-CODEINE 100-10 MG/5ML PO SYRP: 5.0000 mL | ORAL_SOLUTION | Freq: Three times a day (TID) | ORAL | 0 refills | Status: DC | PRN

## 2019-06-26 NOTE — Telephone Encounter (Signed)
I sent her a prescription for Robitussin with codeine.  Take it in measured teaspoons only as directed.  If no improvement, will need to get a chest xray - probably at [facility] since [facility] are closed for the long holiday.

## 2019-06-26 NOTE — Telephone Encounter (Signed)
Patient informed.   CM 

## 2019-06-26 NOTE — Telephone Encounter (Signed)
Patient called saying she is feeling SOB. Feels like cannot get in "enough air." Hard time talking without coughing which then hurts her back also. May need something to help with this cough.  Please advise. Pt was seen yesterday for sick visit.   CM

## 2019-07-03 ENCOUNTER — Other Ambulatory Visit: Payer: Self-pay | Admitting: Internal Medicine

## 2019-07-03 DIAGNOSIS — R059 Cough, unspecified: Secondary | ICD-10-CM

## 2019-07-03 NOTE — Telephone Encounter (Signed)
Requested medication (s) are due for refill today:   Provider to decide  Requested medication (s) are on the active medication list:   Yes  Future visit scheduled:   No  Seen last week   Last ordered: 06/26/2019  118 ml  0 refills  Returned because this medication does not have a protocol assigned to it.   Requested Prescriptions  Pending Prescriptions Disp Refills   VIRTUSSIN A/C 100-10 MG/5ML syrup [Pharmacy Med Name: VIRTUSSIN AC SY (SF&ALCOHOL FREE)] 118 mL     Sig: TAKE 5 ML BY MOUTH THREE TIMES DAILY AS NEEDED FOR COUGH      Off-Protocol Failed - 07/03/2019  7:35 AM      Failed - Medication not assigned to a protocol, review manually.      Passed - Valid encounter within last 12 months    Recent Outpatient Visits           1 week ago Acute non-recurrent maxillary sinusitis   [facility] Glean Hess, MD   4 months ago Suspected COVID-19 virus infection   [facility] Glean Hess, MD   7 months ago Venous insufficiency of both lower extremities   [facility] Glean Hess, MD   7 months ago Annual physical exam   [facility] Glean Hess, MD   11 months ago Plantar fasciitis of left foot   [facility] Glean Hess, MD

## 2019-07-03 NOTE — Telephone Encounter (Signed)
Please advise      KP 

## 2019-07-14 ENCOUNTER — Telehealth: Payer: Self-pay | Admitting: Internal Medicine

## 2019-07-14 ENCOUNTER — Other Ambulatory Visit: Payer: Self-pay | Admitting: Internal Medicine

## 2019-07-14 DIAGNOSIS — J0100 Acute maxillary sinusitis, unspecified: Secondary | ICD-10-CM

## 2019-07-14 MED ORDER — AZITHROMYCIN 250 MG PO TABS: ORAL_TABLET | ORAL | 0 refills | Status: AC

## 2019-07-14 NOTE — Telephone Encounter (Signed)
Sent a refill on Zpak to her pharmacy.

## 2019-07-14 NOTE — Telephone Encounter (Signed)
Copied from [place] [phone]. Topic: General - Inquiry >> Jul 14, 2019  3:56 PM Richardo Priest, NT wrote: Reason for CRM: Patient called in stating she was recently seen in regards to a sinus infection then ear infection. Patient states she feels as if those symptoms are coming back and is wondering if PCP would like to see her again or if the medication can be called in instead. Please advise and call back.

## 2019-07-15 NOTE — Telephone Encounter (Signed)
Called and left VM informing pt of Zpack sent in and it can last 10 days even tho only taking for 5.   CM

## 2019-07-26 ENCOUNTER — Encounter: Payer: Self-pay | Admitting: Internal Medicine

## 2019-07-26 DIAGNOSIS — F324 Major depressive disorder, single episode, in partial remission: Secondary | ICD-10-CM

## 2019-07-26 NOTE — Telephone Encounter (Signed)
Requested medication (s) are due for refill today: yes  Requested medication (s) are on the active medication list: yes  Last refill:  05/30/19  Future visit scheduled: no  Notes to clinic:  called pt and LM on VM to call office and make med refill appt. 90 day courtesy RF already given on 05/30/19   Requested Prescriptions  Pending Prescriptions Disp Refills   DULoxetine (CYMBALTA) 30 MG capsule [Pharmacy Med Name: DULOXETINE DR 30MG  CAPSULES] 90 capsule 0    Sig: TAKE 3 CAPSULES(90 MG) BY MOUTH DAILY      Psychiatry: Antidepressants - SNRI Passed - 07/26/2019 11:04 AM      Passed - Completed PHQ-2 or PHQ-9 in the last 360 days.      Passed - Last BP in normal range    BP Readings from Last 1 Encounters:  06/25/19 128/70          Passed - Valid encounter within last 6 months    Recent Outpatient Visits           1 month ago Acute non-recurrent maxillary sinusitis   [facility] Glean Hess, MD   5 months ago Suspected COVID-19 virus infection   [facility] Glean Hess, MD   8 months ago Venous insufficiency of both lower extremities   [facility] Glean Hess, MD   8 months ago Annual physical exam   [facility] Glean Hess, MD   1 year ago Plantar fasciitis of left foot   [facility] Glean Hess, MD

## 2019-07-27 ENCOUNTER — Other Ambulatory Visit: Payer: Self-pay | Admitting: Internal Medicine

## 2019-07-27 DIAGNOSIS — F324 Major depressive disorder, single episode, in partial remission: Secondary | ICD-10-CM

## 2019-07-27 MED ORDER — DULOXETINE HCL 30 MG PO CPEP: ORAL_CAPSULE | ORAL | 1 refills | Status: DC

## 2019-07-27 NOTE — Telephone Encounter (Signed)
Requested medication (s) are due for refill today:yes  Requested medication (s) are on the active medication list:yes  Last refill:  05/30/19  #90  0 refills  Future visit scheduled: yes  Notes to clinic:  Courtesy refill has already been given. Patient has now scheduled appointment    Requested Prescriptions  Pending Prescriptions Disp Refills   DULoxetine (CYMBALTA) 30 MG capsule 90 capsule 0      Psychiatry: Antidepressants - SNRI Passed - 07/27/2019  1:07 PM      Passed - Completed PHQ-2 or PHQ-9 in the last 360 days.      Passed - Last BP in normal range    BP Readings from Last 1 Encounters:  06/25/19 128/70          Passed - Valid encounter within last 6 months    Recent Outpatient Visits           1 month ago Acute non-recurrent maxillary sinusitis   [facility] Glean Hess, MD   5 months ago Suspected COVID-19 virus infection   [facility] Glean Hess, MD   8 months ago Venous insufficiency of both lower extremities   [facility] Glean Hess, MD   8 months ago Annual physical exam   [facility] Glean Hess, MD   1 year ago Plantar fasciitis of left foot   [facility] Glean Hess, MD       Future Appointments             In 2 weeks Army Melia Jesse Sans, MD [facility], [facility]

## 2019-07-27 NOTE — Telephone Encounter (Signed)
Pt request refill  DULoxetine (CYMBALTA) 30 MG capsule  Pt has made appt for 7/12, but will be out of her medication before then.  Can you send in 30 day?  [id number] - [address], [address] Phone:  [phone]  Fax:  [phone]

## 2019-07-27 NOTE — Telephone Encounter (Signed)
Noted  KP 

## 2019-07-27 NOTE — Telephone Encounter (Signed)
This encounter was created in error - please disregard.

## 2019-08-09 ENCOUNTER — Other Ambulatory Visit: Payer: Self-pay | Admitting: Internal Medicine

## 2019-08-10 ENCOUNTER — Ambulatory Visit: Payer: BC Managed Care – PPO | Admitting: Internal Medicine

## 2019-08-10 ENCOUNTER — Encounter: Payer: Self-pay | Admitting: Internal Medicine

## 2019-08-10 ENCOUNTER — Other Ambulatory Visit: Payer: Self-pay

## 2019-08-10 VITALS — BP 108/78 | HR 77 | Temp 98.4°F | Ht 65.0 in | Wt 210.0 lb

## 2019-08-10 DIAGNOSIS — F324 Major depressive disorder, single episode, in partial remission: Secondary | ICD-10-CM | POA: Diagnosis not present

## 2019-08-10 DIAGNOSIS — Z1211 Encounter for screening for malignant neoplasm of colon: Secondary | ICD-10-CM

## 2019-08-10 DIAGNOSIS — M47816 Spondylosis without myelopathy or radiculopathy, lumbar region: Secondary | ICD-10-CM

## 2019-08-10 DIAGNOSIS — G43C0 Periodic headache syndromes in child or adult, not intractable: Secondary | ICD-10-CM

## 2019-08-10 DIAGNOSIS — R600 Localized edema: Secondary | ICD-10-CM

## 2019-08-10 MED ORDER — SUMATRIPTAN SUCCINATE 100 MG PO TABS: 100.0000 mg | ORAL_TABLET | ORAL | 5 refills | Status: DC | PRN

## 2019-08-10 MED ORDER — TRIAMTERENE-HCTZ 37.5-25 MG PO TABS: 1.5000 | ORAL_TABLET | Freq: Every day | ORAL | 12 refills | Status: DC

## 2019-08-10 NOTE — Progress Notes (Signed)
Date:  08/10/2019   Name:  Jade Harris   DOB:  [birth year]   MRN:  [id number]   Chief Complaint: Hypertension (follow up ), Depression, and Referral (for back problems X1 year going to [place] for a few months )  Hypertension This is a chronic problem. The problem is controlled. Pertinent negatives include no chest pain, headaches, palpitations or shortness of breath. Past treatments include diuretics. The current treatment provides significant improvement.  Depression        This is a chronic problem.The problem is unchanged.  Associated symptoms include no fatigue, no appetite change and no headaches.  Past treatments include SNRIs - Serotonin and norepinephrine reuptake inhibitors. Back Pain This is a chronic problem. The problem occurs constantly. The problem is unchanged. The pain is present in the lumbar spine. The quality of the pain is described as burning and shooting. Pertinent negatives include no abdominal pain, chest pain, dysuria, fever, headaches or numbness.  MRI showed some degenerative changes mostly at L5 with significant facet arthopathy She is seeing [facility] who recommended nerve ablation but insurance denied. This is being appealed.  She is wondering about a second opinion.  Lab Results  Component Value Date   CREATININE 0.76 11/07/2018   BUN 15 11/07/2018   NA 137 11/07/2018   K 4.3 11/07/2018   CL 98 11/07/2018   CO2 23 11/07/2018   Lab Results  Component Value Date   CHOL 247 (H) 11/07/2018   HDL 86 11/07/2018   LDLCALC 129 (H) 11/07/2018   TRIG 183 (H) 11/07/2018   CHOLHDL 2.9 11/07/2018   Lab Results  Component Value Date   TSH 1.380 11/07/2018   No results found for: HGBA1C Lab Results  Component Value Date   WBC 6.8 11/07/2018   HGB 14.2 11/07/2018   HCT 41.4 11/07/2018   MCV 90 11/07/2018   PLT 277 11/07/2018   Lab Results  Component Value Date   ALT 12 11/07/2018   AST 14 11/07/2018   ALKPHOS 58 11/07/2018   BILITOT  0.3 11/07/2018     Review of Systems  Constitutional: Negative for appetite change, fatigue, fever and unexpected weight change.  Eyes: Negative for visual disturbance.  Respiratory: Negative for cough, chest tightness and shortness of breath.   Cardiovascular: Negative for chest pain, palpitations and leg swelling.  Gastrointestinal: Negative for abdominal pain.  Genitourinary: Negative for dysuria.  Musculoskeletal: Positive for back pain and gait problem. Negative for arthralgias.  Neurological: Negative for tremors, numbness and headaches.  Psychiatric/Behavioral: Positive for depression. Negative for dysphoric mood and sleep disturbance. The patient is not nervous/anxious.     Patient Active Problem List   Diagnosis Date Noted  . Slow transit constipation 11/25/2018  . Venous insufficiency of both lower extremities 11/25/2018  . Eczema 08/23/2017  . Essential hypertension 10/29/2016  . Headache, chronic daily 09/20/2015  . Dyslipidemia 07/04/2014  . Depression, major, single episode, in partial remission ([place]) 07/04/2014  . Menopause 07/04/2014  . 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  . Idiopathic insomnia 07/04/2014    Allergies  Allergen Reactions  . Sulfa Antibiotics Other (See Comments)    Past Surgical History:  Procedure Laterality Date  . ABDOMINAL HYSTERECTOMY    . APPENDECTOMY    . MOUTH SURGERY    . NASAL SINUS SURGERY  04/2014  . TONSILLECTOMY    . TOTAL ABDOMINAL HYSTERECTOMY W/ BILATERAL SALPINGOOPHORECTOMY     wtih oophorectomy    Social History  Tobacco Use  . Smoking status: Former Smoker    Quit date: 1989    Years since quitting: 32.5  . Smokeless tobacco: Never Used  Vaping Use  . Vaping Use: Never used  Substance Use Topics  . Alcohol use: Yes    Alcohol/week: 0.0 standard drinks    Comment: occasional  . Drug use: No     Medication list has been reviewed and updated.  Current Meds  Medication Sig  .  Calcipotriene-Betameth Diprop (ENSTILAR) 0.005-0.064 % FOAM Apply topically. Eczema  . DULoxetine (CYMBALTA) 30 MG capsule TAKE 3 CAPSULES(90 MG) BY MOUTH DAILY  . estradiol (ESTRACE) 1 MG tablet TAKE 1 TABLET BY MOUTH DAILY  . fluticasone (FLONASE) 50 MCG/ACT nasal spray Place 2 sprays into both nostrils daily. Reported on 03/30/2015  . furosemide (LASIX) 20 MG tablet TAKE 1 TABLET(20 MG) BY MOUTH DAILY AS NEEDED  . triamterene-hydrochlorothiazide (MAXZIDE-25) 37.5-25 MG tablet TAKE 1 AND 1/2 TABLETS BY MOUTH DAILY    PHQ 2/9 Scores 08/10/2019 06/25/2019 02/23/2019 02/23/2019  PHQ - 2 Score 0 0 0 0  PHQ- 9 Score 3 0 0 -    GAD 7 : Generalized Anxiety Score 08/10/2019 06/25/2019  Nervous, Anxious, on Edge 0 0  Control/stop worrying 0 0  Worry too much - different things 0 0  Trouble relaxing 0 0  Restless 0 0  Easily annoyed or irritable 0 0  Afraid - awful might happen 0 0  Total GAD 7 Score 0 0  Anxiety Difficulty Not difficult at all Not difficult at all    BP Readings from Last 3 Encounters:  08/10/19 108/78  06/25/19 128/70  11/25/18 128/74    Physical Exam Vitals and nursing note reviewed.  Constitutional:      General: She is in acute distress.     Appearance: Normal appearance. She is well-developed.  HENT:     Head: Normocephalic and atraumatic.  Cardiovascular:     Rate and Rhythm: Normal rate and regular rhythm.     Pulses: Normal pulses.  Pulmonary:     Effort: Pulmonary effort is normal. No respiratory distress.     Breath sounds: Normal breath sounds.  Musculoskeletal:     Comments: Not examined but pt had difficulty sitting to standing - used UEs ; once standing able to walk slowly, unable to stand completely erect due to pain  Skin:    General: Skin is warm and dry.     Findings: No rash.  Neurological:     Mental Status: She is alert and oriented to person, place, and time.  Psychiatric:        Attention and Perception: Attention normal.        Mood and  Affect: Mood is anxious.        Behavior: Behavior normal.     Wt Readings from Last 3 Encounters:  08/10/19 210 lb (95.3 kg)  06/25/19 211 lb (95.7 kg)  02/23/19 197 lb (89.4 kg)    BP 108/78   Pulse 77   Temp 98.4 F (36.9 C) (Oral)   Ht 5\' 5"  (1.651 m)   Wt 210 lb (95.3 kg)   SpO2 96%   BMI 34.95 kg/m   Assessment and Plan: 1. Localized edema Controlled with daily HCTZ - triamterene-hydrochlorothiazide (MAXZIDE-25) 37.5-25 MG tablet; Take 1.5 tablets by mouth daily.  Dispense: 45 tablet; Refill: 12  2. Depression, major, single episode, in partial remission ([place]) Clinically stable on current regimen with good control of symptoms, No  SI or HI. Will continue current therapy with cymbalta  3. Colon cancer screening Pt is given the letter from GI to call to schedule  4. Spondylosis of lumbar region without myelopathy or radiculopathy Ongoing chronic back pain limiting activities Follow up with Ortho - hopefully can get approval for ablation Would not seek second opinion unless [facility] is unable to help  5. Periodic headache syndrome, not intractable Migraines continue to occur intermittently Will refill imitrex - SUMAtriptan (IMITREX) 100 MG tablet; Take 1 tablet (100 mg total) by mouth every 2 (two) hours as needed for migraine.  Dispense: 10 tablet; Refill: 5   Partially dictated using Editor, commissioning. Any errors are unintentional.  Halina Maidens, MD [facility]  08/10/2019

## 2019-08-19 ENCOUNTER — Encounter: Payer: Self-pay | Admitting: Internal Medicine

## 2019-08-19 ENCOUNTER — Other Ambulatory Visit: Payer: Self-pay

## 2019-08-19 ENCOUNTER — Ambulatory Visit: Payer: BC Managed Care – PPO | Admitting: Internal Medicine

## 2019-08-19 VITALS — BP 136/86 | HR 90 | Temp 98.2°F | Ht 65.0 in | Wt 212.0 lb

## 2019-08-19 DIAGNOSIS — M47816 Spondylosis without myelopathy or radiculopathy, lumbar region: Secondary | ICD-10-CM | POA: Diagnosis not present

## 2019-08-19 MED ORDER — TRAMADOL HCL 50 MG PO TABS: 50.0000 mg | ORAL_TABLET | Freq: Four times a day (QID) | ORAL | 0 refills | Status: AC | PRN

## 2019-08-19 NOTE — Progress Notes (Signed)
Date:  08/19/2019   Name:  Jade Harris   DOB:  Sep 12, 1965   MRN:  [id number]   Chief Complaint: Back Pain ([facility] Follow up for back pain. Says [facility] did not prescribe anything to help. Still in pain - same as last week. Did Xray and CT Scan. They gave her a steroid shot and no relief. )  HPI  Lab Results  Component Value Date   CREATININE 0.76 11/07/2018   BUN 15 11/07/2018   NA 137 11/07/2018   K 4.3 11/07/2018   CL 98 11/07/2018   CO2 23 11/07/2018   Lab Results  Component Value Date   CHOL 247 (H) 11/07/2018   HDL 86 11/07/2018   LDLCALC 129 (H) 11/07/2018   TRIG 183 (H) 11/07/2018   CHOLHDL 2.9 11/07/2018   Lab Results  Component Value Date   TSH 1.380 11/07/2018   No results found for: HGBA1C Lab Results  Component Value Date   WBC 6.8 11/07/2018   HGB 14.2 11/07/2018   HCT 41.4 11/07/2018   MCV 90 11/07/2018   PLT 277 11/07/2018   Lab Results  Component Value Date   ALT 12 11/07/2018   AST 14 11/07/2018   ALKPHOS 58 11/07/2018   BILITOT 0.3 11/07/2018     Review of Systems  Constitutional: Negative for chills and fever.  Respiratory: Negative for chest tightness.   Cardiovascular: Negative for chest pain and leg swelling.  Musculoskeletal: Positive for back pain.  Neurological: Negative for dizziness and headaches.  Psychiatric/Behavioral: Positive for sleep disturbance.    Patient Active Problem List   Diagnosis Date Noted  . Slow transit constipation 11/25/2018  . Venous insufficiency of both lower extremities 11/25/2018  . Eczema 08/23/2017  . Headache, chronic daily 09/20/2015  . Dyslipidemia 07/04/2014  . Depression, major, single episode, in partial remission ([place]) 07/04/2014  . Menopause 07/04/2014  . 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  . Idiopathic insomnia 07/04/2014    Allergies  Allergen Reactions  . Sulfa Antibiotics Other (See Comments)    Past Surgical History:  Procedure Laterality Date    . ABDOMINAL HYSTERECTOMY    . APPENDECTOMY    . MOUTH SURGERY    . NASAL SINUS SURGERY  04/2014  . TONSILLECTOMY    . TOTAL ABDOMINAL HYSTERECTOMY W/ BILATERAL SALPINGOOPHORECTOMY     wtih oophorectomy    Social History   Tobacco Use  . Smoking status: Former Smoker    Quit date: 1989    Years since quitting: 32.5  . Smokeless tobacco: Never Used  Vaping Use  . Vaping Use: Never used  Substance Use Topics  . Alcohol use: Yes    Alcohol/week: 0.0 standard drinks    Comment: occasional  . Drug use: No     Medication list has been reviewed and updated.  Current Meds  Medication Sig  . Calcipotriene-Betameth Diprop (ENSTILAR) 0.005-0.064 % FOAM Apply topically. Eczema  . DULoxetine (CYMBALTA) 30 MG capsule TAKE 3 CAPSULES(90 MG) BY MOUTH DAILY  . estradiol (ESTRACE) 1 MG tablet TAKE 1 TABLET BY MOUTH DAILY  . fluticasone (FLONASE) 50 MCG/ACT nasal spray Place 2 sprays into both nostrils daily. Reported on 03/30/2015  . furosemide (LASIX) 20 MG tablet TAKE 1 TABLET(20 MG) BY MOUTH DAILY AS NEEDED  . SUMAtriptan (IMITREX) 100 MG tablet Take 1 tablet (100 mg total) by mouth every 2 (two) hours as needed for migraine.  . triamterene-hydrochlorothiazide (MAXZIDE-25) 37.5-25 MG tablet Take 1.5 tablets by mouth  daily.    PHQ 2/9 Scores 08/19/2019 08/10/2019 06/25/2019 02/23/2019  PHQ - 2 Score 2 0 0 0  PHQ- 9 Score 8 3 0 0    GAD 7 : Generalized Anxiety Score 08/19/2019 08/10/2019 06/25/2019  Nervous, Anxious, on Edge 1 0 0  Control/stop worrying 0 0 0  Worry too much - different things 0 0 0  Trouble relaxing 1 0 0  Restless 1 0 0  Easily annoyed or irritable 1 0 0  Afraid - awful might happen 0 0 0  Total GAD 7 Score 4 0 0  Anxiety Difficulty Not difficult at all Not difficult at all Not difficult at all    BP Readings from Last 3 Encounters:  08/19/19 136/86  08/10/19 108/78  06/25/19 128/70    Physical Exam Vitals and nursing note reviewed.  Constitutional:       General: She is in acute distress.     Appearance: She is well-developed.  HENT:     Head: Normocephalic and atraumatic.  Pulmonary:     Effort: Pulmonary effort is normal. No respiratory distress.  Musculoskeletal:        General: Normal range of motion.  Skin:    General: Skin is warm and dry.     Findings: No rash.  Neurological:     Mental Status: She is alert and oriented to person, place, and time.  Psychiatric:        Behavior: Behavior normal.        Thought Content: Thought content normal.     Wt Readings from Last 3 Encounters:  08/19/19 212 lb (96.2 kg)  08/10/19 210 lb (95.3 kg)  06/25/19 211 lb (95.7 kg)    BP 136/86   Pulse 90   Temp 98.2 F (36.8 C) (Oral)   Ht 5\' 5"  (1.651 m)   Wt 212 lb (96.2 kg)   SpO2 98%   BMI 35.28 kg/m   Assessment and Plan: 1. Spondylosis of lumbar region without myelopathy or radiculopathy Being treated by [facility] - multiple modalities without benefit Waiting on approval from insurance for an ablation but it is very slow She is only taking Aleve for pain - nothing else has been offered At this point she is very worried that she will not be able to return to school in three weeks Will give her a small amount of tramadol to reduce the most severe pain until she can have the ablation done - traMADol (ULTRAM) 50 MG tablet; Take 1 tablet (50 mg total) by mouth every 6 (six) hours as needed for up to 5 days.  Dispense: 20 tablet; Refill: 0   Partially dictated using Editor, commissioning. Any errors are unintentional.  Halina Maidens, MD [facility]  08/19/2019

## 2019-09-18 ENCOUNTER — Other Ambulatory Visit: Payer: Self-pay | Admitting: Internal Medicine

## 2019-09-18 DIAGNOSIS — F324 Major depressive disorder, single episode, in partial remission: Secondary | ICD-10-CM

## 2019-10-16 ENCOUNTER — Telehealth: Payer: Self-pay | Admitting: Internal Medicine

## 2019-10-16 NOTE — Telephone Encounter (Signed)
Copied from [place] [id number]. Topic: General - Inquiry >> Oct 16, 2019 12:22 PM Jade Harris wrote: Reason for CRM: Pt called in inquiring about the antibodies infusion for covid wanted someone to cal back with info . Please advise

## 2019-10-19 ENCOUNTER — Telehealth: Payer: Self-pay

## 2019-10-19 ENCOUNTER — Other Ambulatory Visit: Payer: Self-pay

## 2019-10-19 ENCOUNTER — Encounter: Payer: Self-pay | Admitting: Internal Medicine

## 2019-10-19 ENCOUNTER — Telehealth (INDEPENDENT_AMBULATORY_CARE_PROVIDER_SITE_OTHER): Payer: BC Managed Care – PPO | Admitting: Internal Medicine

## 2019-10-19 ENCOUNTER — Encounter: Payer: BC Managed Care – PPO | Admitting: Internal Medicine

## 2019-10-19 VITALS — Temp 100.2°F | Ht 65.0 in | Wt 210.0 lb

## 2019-10-19 VITALS — Ht 65.0 in | Wt 210.0 lb

## 2019-10-19 DIAGNOSIS — Z20822 Contact with and (suspected) exposure to covid-19: Secondary | ICD-10-CM | POA: Diagnosis not present

## 2019-10-19 MED ORDER — PREDNISONE 10 MG PO TABS: 10.0000 mg | ORAL_TABLET | ORAL | 0 refills | Status: AC

## 2019-10-19 MED ORDER — BUDESONIDE 180 MCG/ACT IN AEPB: 1.0000 | INHALATION_SPRAY | Freq: Two times a day (BID) | RESPIRATORY_TRACT | 0 refills | Status: DC

## 2019-10-19 MED ORDER — GUAIFENESIN-CODEINE 100-10 MG/5ML PO SYRP: 5.0000 mL | ORAL_SOLUTION | Freq: Three times a day (TID) | ORAL | 0 refills | Status: DC | PRN

## 2019-10-19 NOTE — Progress Notes (Signed)
Date:  10/19/2019   Name:  Jade Harris   DOB:  Jun 03, 1965   MRN:  [id number]  I connected with this patient, Jade Harris, by telephone at the patient's home.  I verified that I am speaking with the correct person using two identifiers. This visit was conducted via telephone due to the Covid-19 outbreak from my office at [facility] in [place], [place]. I discussed the limitations, risks, security and privacy concerns of performing an evaluation and management service by telephone. I also discussed with the patient that there may be a patient responsible charge related to this service. The patient expressed understanding and agreed to proceed.  Chief Complaint: Positive Covid Test (Her whole family has Covid. Headache, nausea, cough, hot flashes and chills, sleeping a lot. Can taste and smell. Symptoms started the 15th. Tested at drive thru in [place] the same day and it was Negative. The health department told her she tested too soon. Has nto had another test since.   )  Cough This is a new problem. The current episode started in the past 7 days. The problem has been gradually worsening. The problem occurs every few minutes. The cough is non-productive. Associated symptoms include headaches.    Lab Results  Component Value Date   CREATININE 0.76 11/07/2018   BUN 15 11/07/2018   NA 137 11/07/2018   K 4.3 11/07/2018   CL 98 11/07/2018   CO2 23 11/07/2018   Lab Results  Component Value Date   CHOL 247 (H) 11/07/2018   HDL 86 11/07/2018   LDLCALC 129 (H) 11/07/2018   TRIG 183 (H) 11/07/2018   CHOLHDL 2.9 11/07/2018   Lab Results  Component Value Date   TSH 1.380 11/07/2018   No results found for: HGBA1C Lab Results  Component Value Date   WBC 6.8 11/07/2018   HGB 14.2 11/07/2018   HCT 41.4 11/07/2018   MCV 90 11/07/2018   PLT 277 11/07/2018   Lab Results  Component Value Date   ALT 12 11/07/2018   AST 14 11/07/2018   ALKPHOS 58 11/07/2018   BILITOT 0.3  11/07/2018     Review of Systems  Respiratory: Positive for cough.   Gastrointestinal: Positive for nausea.  Neurological: Positive for headaches.    Patient Active Problem List   Diagnosis Date Noted  . Slow transit constipation 11/25/2018  . Venous insufficiency of both lower extremities 11/25/2018  . Eczema 08/23/2017  . Headache, chronic daily 09/20/2015  . Dyslipidemia 07/04/2014  . Depression, major, single episode, in partial remission ([place]) 07/04/2014  . Menopause 07/04/2014  . 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  . Idiopathic insomnia 07/04/2014    Allergies  Allergen Reactions  . Sulfa Antibiotics Other (See Comments)    Past Surgical History:  Procedure Laterality Date  . ABDOMINAL HYSTERECTOMY    . APPENDECTOMY    . MOUTH SURGERY    . NASAL SINUS SURGERY  04/2014  . TONSILLECTOMY    . TOTAL ABDOMINAL HYSTERECTOMY W/ BILATERAL SALPINGOOPHORECTOMY     wtih oophorectomy    Social History   Tobacco Use  . Smoking status: Former Smoker    Quit date: 1989    Years since quitting: 32.7  . Smokeless tobacco: Never Used  Vaping Use  . Vaping Use: Never used  Substance Use Topics  . Alcohol use: Yes    Alcohol/week: 0.0 standard drinks    Comment: occasional  . Drug use: No     Medication list  has been reviewed and updated.  Current Meds  Medication Sig  . Calcipotriene-Betameth Diprop (ENSTILAR) 0.005-0.064 % FOAM Apply topically. Eczema  . DULoxetine (CYMBALTA) 30 MG capsule TAKE 3 CAPSULES(90 MG) BY MOUTH DAILY  . estradiol (ESTRACE) 1 MG tablet TAKE 1 TABLET BY MOUTH DAILY  . fluticasone (FLONASE) 50 MCG/ACT nasal spray Place 2 sprays into both nostrils daily. Reported on 03/30/2015  . furosemide (LASIX) 20 MG tablet TAKE 1 TABLET(20 MG) BY MOUTH DAILY AS NEEDED  . ibuprofen (ADVIL) 400 MG tablet Take 400 mg by mouth every 6 (six) hours as needed.  . SUMAtriptan (IMITREX) 100 MG tablet Take 1 tablet (100 mg total) by mouth every 2  (two) hours as needed for migraine.  . triamterene-hydrochlorothiazide (MAXZIDE-25) 37.5-25 MG tablet Take 1.5 tablets by mouth daily.    PHQ 2/9 Scores 08/19/2019 08/10/2019 06/25/2019 02/23/2019  PHQ - 2 Score 2 0 0 0  PHQ- 9 Score 8 3 0 0    GAD 7 : Generalized Anxiety Score 08/19/2019 08/10/2019 06/25/2019  Nervous, Anxious, on Edge 1 0 0  Control/stop worrying 0 0 0  Worry too much - different things 0 0 0  Trouble relaxing 1 0 0  Restless 1 0 0  Easily annoyed or irritable 1 0 0  Afraid - awful might happen 0 0 0  Total GAD 7 Score 4 0 0  Anxiety Difficulty Not difficult at all Not difficult at all Not difficult at all    BP Readings from Last 3 Encounters:  08/19/19 136/86  08/10/19 108/78  06/25/19 128/70    Physical Exam  Wt Readings from Last 3 Encounters:  10/19/19 210 lb (95.3 kg)  08/19/19 212 lb (96.2 kg)  08/10/19 210 lb (95.3 kg)    Ht 5\' 5"  (1.651 m)   Wt 210 lb (95.3 kg)   BMI 34.95 kg/m   Assessment and Plan:

## 2019-10-19 NOTE — Telephone Encounter (Signed)
This visit type is being conducted due to national recommendations for restrictions regarding the COVID- 19 Pandemic (e.g. social distancing) in effort to limit this patients exposure and mitigate transmission in our community. This visit type is felt to be most appropriate for this patient at this time.   I connected with the patient today and received telephone consent from the patient and patient understand this consent will be good for 1 year.  CM 

## 2019-10-19 NOTE — Progress Notes (Signed)
Date:  10/19/2019   Name:  Jade Harris   DOB:  04/22/1965   MRN:  [id number]   Chief Complaint: Cough (Her whole family has Covid. Headache, nausea, cough, hot flashes and chills, sleeping a lot. Can taste and smell. Symptoms started the 15th. Tested at drive thru in [place] the same day and it was Negative. The health department told her she tested too soon. Has not had another test since.   )  Cough This is a new problem. The current episode started in the past 7 days. The problem occurs hourly. The cough is non-productive. Associated symptoms include chills, a fever, headaches and shortness of breath. Pertinent negatives include no chest pain.    Lab Results  Component Value Date   CREATININE 0.76 11/07/2018   BUN 15 11/07/2018   NA 137 11/07/2018   K 4.3 11/07/2018   CL 98 11/07/2018   CO2 23 11/07/2018   Lab Results  Component Value Date   CHOL 247 (H) 11/07/2018   HDL 86 11/07/2018   LDLCALC 129 (H) 11/07/2018   TRIG 183 (H) 11/07/2018   CHOLHDL 2.9 11/07/2018   Lab Results  Component Value Date   TSH 1.380 11/07/2018   No results found for: HGBA1C Lab Results  Component Value Date   WBC 6.8 11/07/2018   HGB 14.2 11/07/2018   HCT 41.4 11/07/2018   MCV 90 11/07/2018   PLT 277 11/07/2018   Lab Results  Component Value Date   ALT 12 11/07/2018   AST 14 11/07/2018   ALKPHOS 58 11/07/2018   BILITOT 0.3 11/07/2018     Review of Systems  Constitutional: Positive for chills and fever.  HENT: Negative for trouble swallowing.   Respiratory: Positive for cough and shortness of breath.   Cardiovascular: Negative for chest pain and palpitations.  Gastrointestinal: Positive for nausea. Negative for vomiting.  Neurological: Positive for headaches.    Patient Active Problem List   Diagnosis Date Noted  . Slow transit constipation 11/25/2018  . Venous insufficiency of both lower extremities 11/25/2018  . Eczema 08/23/2017  . Headache, chronic daily  09/20/2015  . Dyslipidemia 07/04/2014  . Depression, major, single episode, in partial remission ([place]) 07/04/2014  . Menopause 07/04/2014  . 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  . Idiopathic insomnia 07/04/2014    Allergies  Allergen Reactions  . Sulfa Antibiotics Other (See Comments)    Past Surgical History:  Procedure Laterality Date  . ABDOMINAL HYSTERECTOMY    . APPENDECTOMY    . MOUTH SURGERY    . NASAL SINUS SURGERY  04/2014  . TONSILLECTOMY    . TOTAL ABDOMINAL HYSTERECTOMY W/ BILATERAL SALPINGOOPHORECTOMY     wtih oophorectomy    Social History   Tobacco Use  . Smoking status: Former Smoker    Quit date: 1989    Years since quitting: 32.7  . Smokeless tobacco: Never Used  Vaping Use  . Vaping Use: Never used  Substance Use Topics  . Alcohol use: Yes    Alcohol/week: 0.0 standard drinks    Comment: occasional  . Drug use: No     Medication list has been reviewed and updated.  Current Meds  Medication Sig  . Calcipotriene-Betameth Diprop (ENSTILAR) 0.005-0.064 % FOAM Apply topically. Eczema  . DULoxetine (CYMBALTA) 30 MG capsule TAKE 3 CAPSULES(90 MG) BY MOUTH DAILY  . estradiol (ESTRACE) 1 MG tablet TAKE 1 TABLET BY MOUTH DAILY  . fluticasone (FLONASE) 50 MCG/ACT nasal spray Place 2 sprays  into both nostrils daily. Reported on 03/30/2015  . furosemide (LASIX) 20 MG tablet TAKE 1 TABLET(20 MG) BY MOUTH DAILY AS NEEDED  . ibuprofen (ADVIL) 400 MG tablet Take 400 mg by mouth every 6 (six) hours as needed.  . SUMAtriptan (IMITREX) 100 MG tablet Take 1 tablet (100 mg total) by mouth every 2 (two) hours as needed for migraine.  . triamterene-hydrochlorothiazide (MAXZIDE-25) 37.5-25 MG tablet Take 1.5 tablets by mouth daily.    PHQ 2/9 Scores 08/19/2019 08/10/2019 06/25/2019 02/23/2019  PHQ - 2 Score 2 0 0 0  PHQ- 9 Score 8 3 0 0    GAD 7 : Generalized Anxiety Score 08/19/2019 08/10/2019 06/25/2019  Nervous, Anxious, on Edge 1 0 0  Control/stop  worrying 0 0 0  Worry too much - different things 0 0 0  Trouble relaxing 1 0 0  Restless 1 0 0  Easily annoyed or irritable 1 0 0  Afraid - awful might happen 0 0 0  Total GAD 7 Score 4 0 0  Anxiety Difficulty Not difficult at all Not difficult at all Not difficult at all    BP Readings from Last 3 Encounters:  08/19/19 136/86  08/10/19 108/78  06/25/19 128/70    Physical Exam Vitals and nursing note reviewed.  Constitutional:      General: She is not in acute distress.    Appearance: She is well-developed. She is ill-appearing.  HENT:     Head: Normocephalic and atraumatic.  Pulmonary:     Effort: Pulmonary effort is normal. No respiratory distress.     Comments: Frequent dry cough; no audible wheezing noted Neurological:     Mental Status: She is alert and oriented to person, place, and time.  Psychiatric:        Behavior: Behavior normal.        Thought Content: Thought content normal.     Wt Readings from Last 3 Encounters:  10/19/19 210 lb (95.3 kg)  10/19/19 210 lb (95.3 kg)  08/19/19 212 lb (96.2 kg)    Temp 100.2 F (37.9 C) Comment: once ibuprofen wears off  Ht 5\' 5"  (1.651 m)   Wt 210 lb (95.3 kg)   BMI 34.95 kg/m   Assessment and Plan: 1. Encounter by telehealth for suspected COVID-19 Will be tested by drive here at 3:08 today Would be a candidate for infusion therapy due to length of symptoms with ongoing fever and chest sx Continue Tylenol or Advil, fluids, rest - budesonide (PULMICORT) 180 MCG/ACT inhaler; Inhale 1 puff into the lungs in the morning and at bedtime.  Dispense: 1 each; Refill: 0 - predniSONE (DELTASONE) 10 MG tablet; Take 1 tablet (10 mg total) by mouth as directed for 6 days. Take 6,5,4,3,2,1 then stop  Dispense: 21 tablet; Refill: 0 - Novel Coronavirus, NAA ([facility]) - guaiFENesin-codeine (ROBITUSSIN AC) 100-10 MG/5ML syrup; Take 5 mLs by mouth 3 (three) times daily as needed for cough.  Dispense: 118 mL; Refill: 0  I spent 15  minutes on this encounter.  This encounter was conducted via video encounter due to the need for social distancing in light of the Covid-19 pandemic.  The patient was correctly identified.  I advised that I am conducting the visit from a secure room in my office at [facility] clinic.   The limitations of this form of encounter were discussed with the patient and he/she agreed to proceed.  Some vital signs will be absent.  Partially dictated using Editor, commissioning. Any errors are  unintentional.  Halina Maidens, MD [facility]  10/19/2019

## 2019-10-22 ENCOUNTER — Other Ambulatory Visit: Payer: Self-pay | Admitting: Internal Medicine

## 2019-10-22 LAB — NOVEL CORONAVIRUS, NAA: SARS-CoV-2, NAA: DETECTED — AB

## 2019-10-22 LAB — SARS-COV-2, NAA 2 DAY TAT

## 2019-10-23 ENCOUNTER — Other Ambulatory Visit: Payer: Self-pay | Admitting: Oncology

## 2019-10-23 ENCOUNTER — Encounter: Payer: Self-pay | Admitting: Oncology

## 2019-10-23 ENCOUNTER — Telehealth: Payer: Self-pay | Admitting: Internal Medicine

## 2019-10-23 ENCOUNTER — Ambulatory Visit (HOSPITAL_COMMUNITY)
Admission: RE | Admit: 2019-10-23 | Discharge: 2019-10-23 | Disposition: A | Discharge status: Home / Self Care | Payer: BC Managed Care – PPO | Source: Ambulatory Visit | Attending: Pulmonary Disease | Admitting: Pulmonary Disease

## 2019-10-23 DIAGNOSIS — U071 COVID-19: Secondary | ICD-10-CM

## 2019-10-23 MED ORDER — METHYLPREDNISOLONE SODIUM SUCC 125 MG IJ SOLR: 125.0000 mg | Freq: Once | INTRAMUSCULAR | Status: DC | PRN

## 2019-10-23 MED ORDER — DIPHENHYDRAMINE HCL 50 MG/ML IJ SOLN: 50.0000 mg | Freq: Once | INTRAMUSCULAR | Status: DC | PRN

## 2019-10-23 MED ORDER — ALBUTEROL SULFATE HFA 108 (90 BASE) MCG/ACT IN AERS: 2.0000 | INHALATION_SPRAY | Freq: Once | RESPIRATORY_TRACT | Status: DC | PRN

## 2019-10-23 MED ORDER — SODIUM CHLORIDE 0.9 % IV SOLN: INTRAVENOUS | Status: DC | PRN

## 2019-10-23 MED ORDER — FAMOTIDINE IN NACL 20-0.9 MG/50ML-% IV SOLN: 20.0000 mg | Freq: Once | INTRAVENOUS | Status: DC | PRN

## 2019-10-23 MED ORDER — EPINEPHRINE 0.3 MG/0.3ML IJ SOAJ: 0.3000 mg | Freq: Once | INTRAMUSCULAR | Status: DC | PRN

## 2019-10-23 MED ORDER — SODIUM CHLORIDE 0.9 % IV SOLN
1200.0000 mg | Freq: Once | INTRAVENOUS | Status: AC
  Administered 2019-10-23: 1200 mg via INTRAVENOUS

## 2019-10-23 NOTE — Progress Notes (Signed)
  Diagnosis: COVID-19  Physician: Dr Joya Gaskins  Procedure: Covid Infusion Clinic Med: casirivimab\imdevimab infusion - Provided patient with casirivimab\imdevimab fact sheet for patients, parents and caregivers prior to infusion.  Complications: No immediate complications noted.  Discharge: Discharged home   Jade Harris 10/23/2019

## 2019-10-23 NOTE — Telephone Encounter (Signed)
I saw a message from them that they were sending her to screening and that it was 8 days from sx onset.  I thought is was 10 days for treatment but I don't know for sure.

## 2019-10-23 NOTE — Progress Notes (Signed)
I connected by phone with  Jade Harris to discuss the potential use of an new treatment for mild to moderate COVID-19 viral infection in non-hospitalized patients.   This patient is a age/sex that meets the FDA criteria for Emergency Use Authorization of casirivimab\imdevimab.  Has a (+) direct SARS-CoV-2 viral test result 1. Has mild or moderate COVID-19  2. Is ? 56 years of age and weighs ? 40 kg 3. Is NOT hospitalized due to COVID-19 4. Is NOT requiring oxygen therapy or requiring an increase in baseline oxygen flow rate due to COVID-19 5. Is within 10 days of symptom onset 6. Has at least one of the high risk factor(s) for progression to severe COVID-19 and/or hospitalization as defined in EUA. Specific high risk criteria : Past Medical History:  Diagnosis Date  . Cancer ([place])    skin ca  ?  ?    Symptom onset  10/14/2019   I have spoken and communicated the following to the patient or parent/caregiver:   1. FDA has authorized the emergency use of casirivimab\imdevimab for the treatment of mild to moderate COVID-19 in adults and pediatric patients with positive results of direct SARS-CoV-2 viral testing who are 27 years of age and older weighing at least 40 kg, and who are at high risk for progressing to severe COVID-19 and/or hospitalization.   2. The significant known and potential risks and benefits of casirivimab\imdevimab, and the extent to which such potential risks and benefits are unknown.   3. Information on available alternative treatments and the risks and benefits of those alternatives, including clinical trials.   4. Patients treated with casirivimab\imdevimab should continue to self-isolate and use infection control measures (e.g., wear mask, isolate, social distance, avoid sharing personal items, clean and disinfect "high touch" surfaces, and frequent handwashing) according to CDC guidelines.    5. The patient or parent/caregiver has the option to accept or refuse  casirivimab\imdevimab .   After reviewing this information with the patient, The patient agreed to proceed with receiving casirivimab\imdevimab infusion and will be provided a copy of the Fact sheet prior to receiving the infusion.Rulon Abide, AGNP-C [phone] ([place])

## 2019-10-23 NOTE — Telephone Encounter (Signed)
Copied from [place] [id number]. Topic: General - Inquiry >> Oct 16, 2019 12:22 PM Alease Frame wrote: Reason for CRM: Pt called in inquiring about the antibodies infusion for covid wanted someone to cal back with info . Please advise >> Oct 23, 2019  8:17 AM Scherrie Gerlach wrote: Pt calling this am because she thought she was to get a phone call to get her set up at infusion clinic for covid.  Pt says she was supposed to go back to work Monday, so sooner the better.

## 2019-10-23 NOTE — Discharge Instructions (Signed)

## 2019-10-29 ENCOUNTER — Telehealth: Payer: Self-pay | Admitting: Internal Medicine

## 2019-10-29 NOTE — Telephone Encounter (Signed)
Copied from [place] [id number]. Topic: General - Inquiry >> Oct 29, 2019 12:46 PM Gillis Ends D wrote: Reason for CRM: The patient is being discharging today and she will be receiving enhanced home health the provider ([place]) will be managing her care for up to 30 days. During that time the patient will not be following up with Dr. Army Melia. Once discharged the enhanced home health provider will do a hand off to Dr. Army Melia and then the patient will follow up with Dr. Army Melia. They can be reached at [phone]. The call was made by [facility]. Please advise.

## 2019-11-06 NOTE — Telephone Encounter (Signed)
Called the office and spoke with Daisy to schedule the patient a virtual follow up appt. Placed on hold. Held on the phone for over 8 mins . Advised the patient that someone would call her back to schedule appt. Please advise CB- [phone]

## 2019-11-13 ENCOUNTER — Encounter: Payer: Self-pay | Admitting: Internal Medicine

## 2019-11-13 ENCOUNTER — Other Ambulatory Visit: Payer: Self-pay

## 2019-11-13 ENCOUNTER — Ambulatory Visit (INDEPENDENT_AMBULATORY_CARE_PROVIDER_SITE_OTHER): Payer: BC Managed Care – PPO | Admitting: Internal Medicine

## 2019-11-13 VITALS — BP 128/86 | HR 89 | Temp 97.8°F | Ht 65.0 in | Wt 209.0 lb

## 2019-11-13 DIAGNOSIS — I872 Venous insufficiency (chronic) (peripheral): Secondary | ICD-10-CM

## 2019-11-13 DIAGNOSIS — F324 Major depressive disorder, single episode, in partial remission: Secondary | ICD-10-CM

## 2019-11-13 DIAGNOSIS — U071 COVID-19: Secondary | ICD-10-CM | POA: Diagnosis not present

## 2019-11-13 DIAGNOSIS — J1282 Pneumonia due to coronavirus disease 2019: Secondary | ICD-10-CM

## 2019-11-13 DIAGNOSIS — R7401 Elevation of levels of liver transaminase levels: Secondary | ICD-10-CM

## 2019-11-13 NOTE — Progress Notes (Signed)
Date:  11/13/2019   Name:  Jade Harris   DOB:  [birth year]   MRN:  [id number]   Chief Complaint: Hospitalization Follow-up (Patient following up today from having Covid 19.) Follow up from Covid-19 hospitalization.  Admitted to [facility] 9/25 - 10/29/19.  She was followed by the home health program for the past 2 weeks, discharged today.  Hospital Course:   Jade Harris is a 54 y.o. female admitted at [facility] for COVID-19 infection with acute hypoxemic respiratory failure.   COVID-19 pneumonia with AHRF: Date of symptom onset: 9/15 Date of positive SARS-COV2 PCR: 9/20 Therapy: Remdesivir , Dexamethasone and Baricitinib Supplemental oxygen: HFNC 45L/45% at peak; room air at discharge Vaccination status: Not vaccinated - interested in COVID vaccine Discharged with enhanced home health? Yes  Patient admitted with severe COVID pneumonia and hypoxemic respiratory failure. She was provided supportive care, supplemental oxygen, dexamethasone, remdesivir, and baricitinib. Radiology read CXR study as possible necrotizing pneumonia or abscess and recommended dedicated chest CT which did not show any abscess or PE and demonstrated findings consistent with covid pneumonia. Clinically improved and able to wean off oxygen prior to discharge.   Home isolation was recommended through at least 10/1 based on guidance to self-isolate until patient has not had a fever for one (1) day, symptoms have improved significantly and at least ten (10) days have passed since symptoms began.  The patient was counseled on home isolation guidance. The patient was counseled on reasons to return to care. A discharge packet with appropriate information about how to prevent the spread of COVID-19 to others was provided. Patient treated with lovenox during admission per [facility] protocol but did not meet criteria for extended prophylaxis at discharge.  Other chronic medical problems managed inpatient: Transaminitis:  LFTs increased 9/28, AST 73, ALT 105. Suspect due to Remdesivir vs COVID. Improving at discharge; AST normalized, ALT only mildly elevated Depression: Continued home Cymbalta 90 mg daily Hypertension: Home hydrochlorothiazide/triamterence held during admission and blood pressures low-normal range so will have patient follow-up with PCP for recheck and to determine if she should restart medication.   HPI  Lab Results  Component Value Date   CREATININE 0.76 11/07/2018   BUN 15 11/07/2018   NA 137 11/07/2018   K 4.3 11/07/2018   CL 98 11/07/2018   CO2 23 11/07/2018   Lab Results  Component Value Date   CHOL 247 (H) 11/07/2018   HDL 86 11/07/2018   LDLCALC 129 (H) 11/07/2018   TRIG 183 (H) 11/07/2018   CHOLHDL 2.9 11/07/2018   Lab Results  Component Value Date   TSH 1.380 11/07/2018   No results found for: HGBA1C Lab Results  Component Value Date   WBC 6.8 11/07/2018   HGB 14.2 11/07/2018   HCT 41.4 11/07/2018   MCV 90 11/07/2018   PLT 277 11/07/2018   Lab Results  Component Value Date   ALT 12 11/07/2018   AST 14 11/07/2018   ALKPHOS 58 11/07/2018   BILITOT 0.3 11/07/2018     Review of Systems  Constitutional: Positive for fatigue. Negative for chills, diaphoresis and fever.  Respiratory: Positive for chest tightness and shortness of breath. Negative for cough and wheezing.   Cardiovascular: Positive for leg swelling. Negative for chest pain and palpitations.  Gastrointestinal: Negative for abdominal pain, constipation, diarrhea, nausea and vomiting.  Neurological: Negative for dizziness, light-headedness and headaches.  Psychiatric/Behavioral: Negative for dysphoric mood and sleep disturbance. The patient is not nervous/anxious.  Patient Active Problem List   Diagnosis Date Noted  . Slow transit constipation 11/25/2018  . Venous insufficiency of both lower extremities 11/25/2018  . Eczema 08/23/2017  . Headache, chronic daily 09/20/2015  . Dyslipidemia  07/04/2014  . Depression, major, single episode, in partial remission ([place]) 07/04/2014  . Menopause 07/04/2014  . 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  . Idiopathic insomnia 07/04/2014    Allergies  Allergen Reactions  . Sulfa Antibiotics Other (See Comments)    Past Surgical History:  Procedure Laterality Date  . ABDOMINAL HYSTERECTOMY    . APPENDECTOMY    . MOUTH SURGERY    . NASAL SINUS SURGERY  04/2014  . TONSILLECTOMY    . TOTAL ABDOMINAL HYSTERECTOMY W/ BILATERAL SALPINGOOPHORECTOMY     wtih oophorectomy    Social History   Tobacco Use  . Smoking status: Former Smoker    Quit date: 1989    Years since quitting: 32.8  . Smokeless tobacco: Never Used  Vaping Use  . Vaping Use: Never used  Substance Use Topics  . Alcohol use: Yes    Alcohol/week: 0.0 standard drinks    Comment: occasional  . Drug use: No     Medication list has been reviewed and updated.  Current Meds  Medication Sig  . benzonatate (TESSALON) 100 MG capsule Take 1 capsule by mouth 3 (three) times daily as needed.  . budesonide (PULMICORT) 180 MCG/ACT inhaler Inhale 1 puff into the lungs in the morning and at bedtime.  . Calcipotriene-Betameth Diprop (ENSTILAR) 0.005-0.064 % FOAM Apply topically. Eczema  . DULoxetine (CYMBALTA) 30 MG capsule TAKE 3 CAPSULES(90 MG) BY MOUTH DAILY  . estradiol (ESTRACE) 1 MG tablet TAKE 1 TABLET BY MOUTH DAILY  . fluticasone (FLONASE) 50 MCG/ACT nasal spray Place 2 sprays into both nostrils daily. Reported on 03/30/2015  . furosemide (LASIX) 20 MG tablet TAKE 1 TABLET(20 MG) BY MOUTH DAILY AS NEEDED  . guaiFENesin-codeine (ROBITUSSIN AC) 100-10 MG/5ML syrup Take 5 mLs by mouth 3 (three) times daily as needed for cough.  Marland Kitchen ibuprofen (ADVIL) 400 MG tablet Take 400 mg by mouth every 6 (six) hours as needed.  . nystatin (MYCOSTATIN) 100000 UNIT/ML suspension Take by mouth.  . SUMAtriptan (IMITREX) 100 MG tablet Take 1 tablet (100 mg total) by mouth every 2  (two) hours as needed for migraine.    PHQ 2/9 Scores 11/13/2019 08/19/2019 08/10/2019 06/25/2019  PHQ - 2 Score 2 2 0 0  PHQ- 9 Score 8 8 3  0    GAD 7 : Generalized Anxiety Score 11/13/2019 08/19/2019 08/10/2019 06/25/2019  Nervous, Anxious, on Edge 0 1 0 0  Control/stop worrying 0 0 0 0  Worry too much - different things 0 0 0 0  Trouble relaxing 0 1 0 0  Restless 0 1 0 0  Easily annoyed or irritable 0 1 0 0  Afraid - awful might happen 0 0 0 0  Total GAD 7 Score 0 4 0 0  Anxiety Difficulty Not difficult at all Not difficult at all Not difficult at all Not difficult at all    BP Readings from Last 3 Encounters:  11/13/19 128/86  10/23/19 118/78  08/19/19 136/86    Physical Exam Vitals and nursing note reviewed.  Constitutional:      General: She is not in acute distress.    Appearance: Normal appearance. She is well-developed. She is ill-appearing (mildly ill appearing).  HENT:     Head: Normocephalic and atraumatic.  Cardiovascular:  Rate and Rhythm: Normal rate and regular rhythm.     Pulses: Normal pulses.  Pulmonary:     Effort: Pulmonary effort is normal. No respiratory distress.     Breath sounds: Decreased breath sounds present. No wheezing or rhonchi.  Musculoskeletal:        General: Normal range of motion.     Cervical back: Normal range of motion.     Right lower leg: 1+ Pitting Edema present.     Left lower leg: 1+ Pitting Edema present.  Lymphadenopathy:     Cervical: No cervical adenopathy.  Skin:    General: Skin is warm and dry.     Findings: No rash.  Neurological:     Mental Status: She is alert and oriented to person, place, and time.  Psychiatric:        Attention and Perception: Attention normal.     Wt Readings from Last 3 Encounters:  11/13/19 209 lb (94.8 kg)  10/19/19 210 lb (95.3 kg)  10/19/19 210 lb (95.3 kg)    BP 128/86   Pulse 89   Temp 97.8 F (36.6 C) (Oral)   Ht 5\' 5"  (1.651 m)   Wt 209 lb (94.8 kg)   SpO2 94%   BMI  34.78 kg/m   Assessment and Plan: 1. Pneumonia due to COVID-19 virus Improving as expected Continue Pulmicort inhaler bid Remain out of work - re-evaluate in 2 weeks for part time return  2. Depression, major, single episode, in partial remission ([facility]) Clinically stable on current regimen with good control of symptoms, No SI or HI. Will continue current therapy.  3. Transaminitis Resolved at time of discharge  4. Venous insufficiency of both lower extremities May resume Maxzide PRN   Partially dictated using Editor, commissioning. Any errors are unintentional.  Halina Maidens, MD [facility]  11/13/2019

## 2019-11-25 ENCOUNTER — Telehealth: Payer: Self-pay

## 2019-11-25 NOTE — Telephone Encounter (Signed)
Forms were dropped off for patient to return to work. She was last seen two weeks ago with Covid pneumonia. She needs to schedule a follow up appt before we can return her back to work safely. She may need a follow up chest XRay as well.  Please call the patient and schedule a follow up this week or early next week.    Thank you.

## 2019-11-30 ENCOUNTER — Other Ambulatory Visit: Payer: Self-pay

## 2019-11-30 ENCOUNTER — Encounter: Payer: Self-pay | Admitting: Internal Medicine

## 2019-11-30 ENCOUNTER — Ambulatory Visit: Payer: BC Managed Care – PPO | Admitting: Internal Medicine

## 2019-11-30 VITALS — BP 128/82 | HR 91 | Temp 98.0°F | Ht 65.0 in | Wt 208.0 lb

## 2019-11-30 DIAGNOSIS — U071 COVID-19: Secondary | ICD-10-CM | POA: Diagnosis not present

## 2019-11-30 DIAGNOSIS — J1282 Pneumonia due to coronavirus disease 2019: Secondary | ICD-10-CM

## 2019-11-30 NOTE — Progress Notes (Signed)
Date:  11/30/2019   Name:  Jade Harris   DOB:  October 02, 1965   MRN:  [id number]   Chief Complaint: Pneumonia (and chest xray f/u)  Pneumonia She complains of cough and shortness of breath. There is no chest tightness, difficulty breathing, sputum production or wheezing. This is a new problem. The current episode started 1 to 4 weeks ago. The problem has been gradually improving. The cough is non-productive. Pertinent negatives include no chest pain, fever or headaches.  Overall she feels that she is gradually gaining strength.  Her main issue is fatigue.  Not sleeping well due to stopping estrogen.  Some dry cough but no wheezing and shortness of breath is more due to fatigue.  Feels that she wants to try back to work part time.  Lab Results  Component Value Date   CREATININE 0.76 11/07/2018   BUN 15 11/07/2018   NA 137 11/07/2018   K 4.3 11/07/2018   CL 98 11/07/2018   CO2 23 11/07/2018   Lab Results  Component Value Date   CHOL 247 (H) 11/07/2018   HDL 86 11/07/2018   LDLCALC 129 (H) 11/07/2018   TRIG 183 (H) 11/07/2018   CHOLHDL 2.9 11/07/2018   Lab Results  Component Value Date   TSH 1.380 11/07/2018   No results found for: HGBA1C Lab Results  Component Value Date   WBC 6.8 11/07/2018   HGB 14.2 11/07/2018   HCT 41.4 11/07/2018   MCV 90 11/07/2018   PLT 277 11/07/2018   Lab Results  Component Value Date   ALT 12 11/07/2018   AST 14 11/07/2018   ALKPHOS 58 11/07/2018   BILITOT 0.3 11/07/2018     Review of Systems  Constitutional: Positive for fatigue. Negative for chills, diaphoresis, fever and unexpected weight change.  Respiratory: Positive for cough and shortness of breath. Negative for sputum production, chest tightness and wheezing.   Cardiovascular: Negative for chest pain and leg swelling.  Neurological: Negative for dizziness, light-headedness and headaches.    Patient Active Problem List   Diagnosis Date Noted  . Slow transit constipation  11/25/2018  . Venous insufficiency of both lower extremities 11/25/2018  . Eczema 08/23/2017  . Headache, chronic daily 09/20/2015  . Dyslipidemia 07/04/2014  . Depression, major, single episode, in partial remission ([place]) 07/04/2014  . Menopause 07/04/2014  . 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  . Idiopathic insomnia 07/04/2014    Allergies  Allergen Reactions  . Sulfa Antibiotics Other (See Comments)    Past Surgical History:  Procedure Laterality Date  . ABDOMINAL HYSTERECTOMY    . APPENDECTOMY    . MOUTH SURGERY    . NASAL SINUS SURGERY  04/2014  . TONSILLECTOMY    . TOTAL ABDOMINAL HYSTERECTOMY W/ BILATERAL SALPINGOOPHORECTOMY     wtih oophorectomy    Social History   Tobacco Use  . Smoking status: Former Smoker    Quit date: 1989    Years since quitting: 32.8  . Smokeless tobacco: Never Used  Vaping Use  . Vaping Use: Never used  Substance Use Topics  . Alcohol use: Yes    Alcohol/week: 0.0 standard drinks    Comment: occasional  . Drug use: No     Medication list has been reviewed and updated.  Current Meds  Medication Sig  . budesonide (PULMICORT) 180 MCG/ACT inhaler Inhale 1 puff into the lungs in the morning and at bedtime.  . Calcipotriene-Betameth Diprop (ENSTILAR) 0.005-0.064 % FOAM Apply topically as needed. Eczema  .  DULoxetine (CYMBALTA) 30 MG capsule TAKE 3 CAPSULES(90 MG) BY MOUTH DAILY  . fluticasone (FLONASE) 50 MCG/ACT nasal spray Place 2 sprays into both nostrils daily. Reported on 03/30/2015  . ibuprofen (ADVIL) 400 MG tablet Take 400 mg by mouth every 6 (six) hours as needed.  . SUMAtriptan (IMITREX) 100 MG tablet Take 1 tablet (100 mg total) by mouth every 2 (two) hours as needed for migraine.  . triamterene-hydrochlorothiazide (MAXZIDE-25) 37.5-25 MG tablet Take by mouth as needed.     PHQ 2/9 Scores 11/30/2019 11/13/2019 08/19/2019 08/10/2019  PHQ - 2 Score 0 2 2 0  PHQ- 9 Score 1 8 8 3     GAD 7 : Generalized Anxiety  Score 11/30/2019 11/13/2019 08/19/2019 08/10/2019  Nervous, Anxious, on Edge 0 0 1 0  Control/stop worrying 0 0 0 0  Worry too much - different things 0 0 0 0  Trouble relaxing 0 0 1 0  Restless 0 0 1 0  Easily annoyed or irritable 0 0 1 0  Afraid - awful might happen 0 0 0 0  Total GAD 7 Score 0 0 4 0  Anxiety Difficulty Not difficult at all Not difficult at all Not difficult at all Not difficult at all    BP Readings from Last 3 Encounters:  11/30/19 128/82  11/13/19 128/86  10/23/19 118/78    Physical Exam Vitals reviewed.  Constitutional:      Appearance: Normal appearance.  Cardiovascular:     Rate and Rhythm: Normal rate and regular rhythm.     Pulses: Normal pulses.     Heart sounds: No murmur heard.   Pulmonary:     Effort: Pulmonary effort is normal. No respiratory distress.     Breath sounds: No wheezing or rhonchi.  Musculoskeletal:     Cervical back: Normal range of motion.     Right lower leg: No edema.     Left lower leg: No edema.  Lymphadenopathy:     Cervical: No cervical adenopathy.  Skin:    Capillary Refill: Capillary refill takes less than 2 seconds.  Neurological:     Mental Status: She is alert.  Psychiatric:        Mood and Affect: Mood normal.     Wt Readings from Last 3 Encounters:  11/30/19 208 lb (94.3 kg)  11/13/19 209 lb (94.8 kg)  10/19/19 210 lb (95.3 kg)    BP 128/82   Pulse 91   Temp 98 F (36.7 C) (Oral)   Ht 5\' 5"  (1.651 m)   Wt 208 lb (94.3 kg)   SpO2 97%   BMI 34.61 kg/m   Assessment and Plan: 1. Pneumonia due to COVID-19 virus Clinically stable and gradually improving with respect to fatigue.  Respiratory symptoms have largely resolved except for slight dry cough. Exam is clear so no indication for repeat CXR. Will allow return to work half days for the next 2 weeks then full time if able. Return as needed   Partially dictated using [employer]. Any errors are unintentional.  Halina Maidens, MD [facility]  11/30/2019

## 2019-11-30 NOTE — Patient Instructions (Signed)
Try [employer] [facility] or [employer]

## 2019-12-11 ENCOUNTER — Ambulatory Visit (INDEPENDENT_AMBULATORY_CARE_PROVIDER_SITE_OTHER): Payer: BC Managed Care – PPO | Admitting: Internal Medicine

## 2019-12-11 ENCOUNTER — Encounter: Payer: Self-pay | Admitting: Internal Medicine

## 2019-12-11 ENCOUNTER — Other Ambulatory Visit: Payer: Self-pay

## 2019-12-11 VITALS — BP 120/84 | HR 81 | Temp 97.9°F | Ht 65.0 in | Wt 208.0 lb

## 2019-12-11 DIAGNOSIS — U071 COVID-19: Secondary | ICD-10-CM | POA: Diagnosis not present

## 2019-12-11 DIAGNOSIS — M47816 Spondylosis without myelopathy or radiculopathy, lumbar region: Secondary | ICD-10-CM | POA: Diagnosis not present

## 2019-12-11 DIAGNOSIS — U099 Post covid-19 condition, unspecified: Secondary | ICD-10-CM | POA: Diagnosis not present

## 2019-12-11 DIAGNOSIS — J1282 Pneumonia due to coronavirus disease 2019: Secondary | ICD-10-CM

## 2019-12-11 NOTE — Progress Notes (Signed)
Date:  12/11/2019   Name:  Jade Harris   DOB:  Apr 06, 1965   MRN:  [id number]   Chief Complaint: Pneumonia (f/u due to covid, pt feels ok but his a little out of breath and feels tired) She has been working half days for the past 2 weeks. She has done fairly well but often comes home and takes a nap.  Her breathing is better, minimal nonproductive cough. She struggles with mental fog but is able to do her job well. She would like to continue part time for another 2 weeks if possible.  Back Pain This is a chronic problem. The problem occurs daily. The problem is unchanged. The pain is present in the lumbar spine. Pertinent negatives include no chest pain, fever or headaches. She has tried muscle relaxant, NSAIDs and home exercises (PTx and ESI.  [facility] tried to get ablation covered but it was denied.) for the symptoms. The treatment provided mild relief.     Lab Results  Component Value Date   CREATININE 0.76 11/07/2018   BUN 15 11/07/2018   NA 137 11/07/2018   K 4.3 11/07/2018   CL 98 11/07/2018   CO2 23 11/07/2018   Lab Results  Component Value Date   CHOL 247 (H) 11/07/2018   HDL 86 11/07/2018   LDLCALC 129 (H) 11/07/2018   TRIG 183 (H) 11/07/2018   CHOLHDL 2.9 11/07/2018   Lab Results  Component Value Date   TSH 1.380 11/07/2018   No results found for: HGBA1C Lab Results  Component Value Date   WBC 6.8 11/07/2018   HGB 14.2 11/07/2018   HCT 41.4 11/07/2018   MCV 90 11/07/2018   PLT 277 11/07/2018   Lab Results  Component Value Date   ALT 12 11/07/2018   AST 14 11/07/2018   ALKPHOS 58 11/07/2018   BILITOT 0.3 11/07/2018     Review of Systems  Constitutional: Positive for fatigue. Negative for chills, fever and unexpected weight change.  Respiratory: Positive for cough and shortness of breath. Negative for chest tightness and wheezing.   Cardiovascular: Negative for chest pain, palpitations and leg swelling.  Musculoskeletal: Positive for back  pain and gait problem.  Neurological: Negative for dizziness, light-headedness and headaches.  Psychiatric/Behavioral: Positive for decreased concentration. Negative for sleep disturbance. The patient is not nervous/anxious.     Patient Active Problem List   Diagnosis Date Noted  . Slow transit constipation 11/25/2018  . Venous insufficiency of both lower extremities 11/25/2018  . Eczema 08/23/2017  . Headache, chronic daily 09/20/2015  . Dyslipidemia 07/04/2014  . Depression, major, single episode, in partial remission ([place]) 07/04/2014  . Menopause 07/04/2014  . 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  . Idiopathic insomnia 07/04/2014    Allergies  Allergen Reactions  . Sulfa Antibiotics Other (See Comments)    Past Surgical History:  Procedure Laterality Date  . ABDOMINAL HYSTERECTOMY    . APPENDECTOMY    . MOUTH SURGERY    . NASAL SINUS SURGERY  04/2014  . TONSILLECTOMY    . TOTAL ABDOMINAL HYSTERECTOMY W/ BILATERAL SALPINGOOPHORECTOMY     wtih oophorectomy    Social History   Tobacco Use  . Smoking status: Former Smoker    Quit date: 1989    Years since quitting: 32.8  . Smokeless tobacco: Never Used  Vaping Use  . Vaping Use: Never used  Substance Use Topics  . Alcohol use: Yes    Alcohol/week: 0.0 standard drinks  Comment: occasional  . Drug use: No     Medication list has been reviewed and updated.  Current Meds  Medication Sig  . B Complex Vitamins (B COMPLEX PO) Take by mouth.  . budesonide (PULMICORT) 180 MCG/ACT inhaler Inhale 1 puff into the lungs in the morning and at bedtime.  . Calcipotriene-Betameth Diprop (ENSTILAR) 0.005-0.064 % FOAM Apply topically as needed. Eczema  . Cholecalciferol (VITAMIN D3 PO) Take by mouth.  . DULoxetine (CYMBALTA) 30 MG capsule TAKE 3 CAPSULES(90 MG) BY MOUTH DAILY  . fluticasone (FLONASE) 50 MCG/ACT nasal spray Place 2 sprays into both nostrils daily. Reported on 03/30/2015  . ibuprofen (ADVIL) 400 MG  tablet Take 400 mg by mouth every 6 (six) hours as needed.  . Multiple Vitamin (MULTI-VITAMIN PO) Take by mouth.  . Multiple Vitamins-Minerals (ZINC PO) Take by mouth.  . triamterene-hydrochlorothiazide (MAXZIDE-25) 37.5-25 MG tablet Take by mouth as needed.     PHQ 2/9 Scores 12/11/2019 11/30/2019 11/13/2019 08/19/2019  PHQ - 2 Score 0 0 2 2  PHQ- 9 Score 1 1 8 8     GAD 7 : Generalized Anxiety Score 12/11/2019 11/30/2019 11/13/2019 08/19/2019  Nervous, Anxious, on Edge 0 0 0 1  Control/stop worrying 0 0 0 0  Worry too much - different things 0 0 0 0  Trouble relaxing 0 0 0 1  Restless 0 0 0 1  Easily annoyed or irritable 0 0 0 1  Afraid - awful might happen 0 0 0 0  Total GAD 7 Score 0 0 0 4  Anxiety Difficulty - Not difficult at all Not difficult at all Not difficult at all    BP Readings from Last 3 Encounters:  12/11/19 120/84  11/30/19 128/82  11/13/19 128/86    Physical Exam Vitals and nursing note reviewed.  Constitutional:      General: She is not in acute distress.    Appearance: Normal appearance. She is well-developed.  HENT:     Head: Normocephalic and atraumatic.  Cardiovascular:     Rate and Rhythm: Normal rate and regular rhythm.     Pulses: Normal pulses.     Heart sounds: No murmur heard.   Pulmonary:     Effort: Pulmonary effort is normal. No respiratory distress.     Breath sounds: No wheezing or rhonchi.  Musculoskeletal:     Comments: Gets up slowly, moves slowly onto the exam table   Skin:    General: Skin is warm and dry.     Capillary Refill: Capillary refill takes less than 2 seconds.     Findings: No rash.  Neurological:     General: No focal deficit present.     Mental Status: She is alert and oriented to person, place, and time.  Psychiatric:        Mood and Affect: Mood normal.     Wt Readings from Last 3 Encounters:  12/11/19 208 lb (94.3 kg)  11/30/19 208 lb (94.3 kg)  11/13/19 209 lb (94.8 kg)    BP 120/84   Pulse 81    Temp 97.9 F (36.6 C) (Oral)   Ht 5\' 5"  (1.651 m)   Wt 208 lb (94.3 kg)   SpO2 97%   BMI 34.61 kg/m   Assessment and Plan: 1. Post covid-19 condition, unspecified Fatigue and concentration issues are slowly improving Will give another 2 weeks of part time work excuse  2. Pneumonia due to COVID-19 virus Appears resolved Continue Pulmicort PRN  3. Spondylosis of lumbar  region without myelopathy or radiculopathy [facility] has nothing else to offer Pt would like another opinion - Ambulatory referral to Pain Clinic   Partially dictated using [place]. Any errors are unintentional.  Halina Maidens, MD [facility]  12/11/2019

## 2019-12-22 ENCOUNTER — Telehealth: Payer: Self-pay

## 2019-12-22 NOTE — Telephone Encounter (Signed)
Sent to the referrals team. They contacted the pain center who said they think they overlooked her referral and they will send the patients information to the providers and then call the patient to schedule.

## 2019-12-22 NOTE — Telephone Encounter (Signed)
Copied from [place] [phone]. Topic: Referral - Status >> Dec 22, 2019 11:39 AM Scherrie Gerlach wrote: Reason for CRM: pt following up on referral to pain management. Dr Carolin Coy put this in on 11/12.

## 2019-12-30 ENCOUNTER — Telehealth: Payer: Self-pay

## 2019-12-30 ENCOUNTER — Encounter: Payer: Self-pay | Admitting: Internal Medicine

## 2019-12-30 NOTE — Telephone Encounter (Signed)
Letter written and printed.  Just needs to be signed if she is going to pick it up.

## 2019-12-30 NOTE — Telephone Encounter (Unsigned)
Copied from [place] [id number]. Topic: General - Other >> Dec 30, 2019  8:17 AM Alanda Slim E wrote: Reason for CRM: Pt needs a return to work letter / stating that she can return to work full time with a start date of November 29th/ please advise   Pt has a letter that says she can return part time until November 29th / Pt needs a new letter to return full time now

## 2020-01-24 DIAGNOSIS — M899 Disorder of bone, unspecified: Secondary | ICD-10-CM | POA: Insufficient documentation

## 2020-01-24 DIAGNOSIS — G894 Chronic pain syndrome: Secondary | ICD-10-CM | POA: Insufficient documentation
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
## 2020-01-25 ENCOUNTER — Encounter: Payer: Self-pay | Admitting: Pain Medicine

## 2020-01-25 ENCOUNTER — Other Ambulatory Visit: Payer: Self-pay

## 2020-01-25 ENCOUNTER — Ambulatory Visit: Payer: BC Managed Care – PPO | Attending: Pain Medicine | Admitting: Pain Medicine

## 2020-01-25 VITALS — BP 155/99 | HR 77 | Temp 96.8°F | Resp 18 | Ht 65.0 in | Wt 200.0 lb

## 2020-01-25 DIAGNOSIS — M5137 Other intervertebral disc degeneration, lumbosacral region: Secondary | ICD-10-CM | POA: Diagnosis present

## 2020-01-25 DIAGNOSIS — M47817 Spondylosis without myelopathy or radiculopathy, lumbosacral region: Secondary | ICD-10-CM | POA: Diagnosis present

## 2020-01-25 DIAGNOSIS — R200 Anesthesia of skin: Secondary | ICD-10-CM | POA: Insufficient documentation

## 2020-01-25 DIAGNOSIS — M7918 Myalgia, other site: Secondary | ICD-10-CM | POA: Diagnosis present

## 2020-01-25 DIAGNOSIS — R1031 Right lower quadrant pain: Secondary | ICD-10-CM | POA: Diagnosis present

## 2020-01-25 DIAGNOSIS — M542 Cervicalgia: Secondary | ICD-10-CM | POA: Insufficient documentation

## 2020-01-25 DIAGNOSIS — M5417 Radiculopathy, lumbosacral region: Secondary | ICD-10-CM | POA: Insufficient documentation

## 2020-01-25 DIAGNOSIS — Z79899 Other long term (current) drug therapy: Secondary | ICD-10-CM | POA: Insufficient documentation

## 2020-01-25 DIAGNOSIS — M5481 Occipital neuralgia: Secondary | ICD-10-CM | POA: Diagnosis present

## 2020-01-25 DIAGNOSIS — M5030 Other cervical disc degeneration, unspecified cervical region: Secondary | ICD-10-CM | POA: Diagnosis present

## 2020-01-25 DIAGNOSIS — M5136 Other intervertebral disc degeneration, lumbar region: Secondary | ICD-10-CM | POA: Diagnosis not present

## 2020-01-25 DIAGNOSIS — G8929 Other chronic pain: Secondary | ICD-10-CM | POA: Diagnosis present

## 2020-01-25 DIAGNOSIS — M5412 Radiculopathy, cervical region: Secondary | ICD-10-CM | POA: Diagnosis present

## 2020-01-25 DIAGNOSIS — M545 Low back pain, unspecified: Secondary | ICD-10-CM | POA: Diagnosis not present

## 2020-01-25 DIAGNOSIS — R202 Paresthesia of skin: Secondary | ICD-10-CM | POA: Insufficient documentation

## 2020-01-25 DIAGNOSIS — Z789 Other specified health status: Secondary | ICD-10-CM | POA: Insufficient documentation

## 2020-01-25 DIAGNOSIS — M899 Disorder of bone, unspecified: Secondary | ICD-10-CM | POA: Insufficient documentation

## 2020-01-25 DIAGNOSIS — G4486 Cervicogenic headache: Secondary | ICD-10-CM | POA: Insufficient documentation

## 2020-01-25 DIAGNOSIS — G894 Chronic pain syndrome: Secondary | ICD-10-CM | POA: Diagnosis present

## 2020-01-25 DIAGNOSIS — M79604 Pain in right leg: Secondary | ICD-10-CM | POA: Insufficient documentation

## 2020-01-25 NOTE — Progress Notes (Signed)
Safety precautions to be maintained throughout the outpatient stay will include: orient to surroundings, keep bed in low position, maintain call bell within reach at all times, provide assistance with transfer out of bed and ambulation.  

## 2020-01-28 ENCOUNTER — Ambulatory Visit
Admission: RE | Admit: 2020-01-28 | Discharge: 2020-01-28 | Disposition: A | Discharge status: Home / Self Care | Payer: BC Managed Care – PPO | Attending: Pain Medicine | Admitting: Pain Medicine

## 2020-01-28 ENCOUNTER — Ambulatory Visit
Admission: RE | Admit: 2020-01-28 | Discharge: 2020-01-28 | Disposition: A | Discharge status: Home / Self Care | Payer: BC Managed Care – PPO | Source: Ambulatory Visit | Attending: Pain Medicine | Admitting: Pain Medicine

## 2020-01-28 DIAGNOSIS — R1031 Right lower quadrant pain: Secondary | ICD-10-CM | POA: Insufficient documentation

## 2020-01-28 DIAGNOSIS — G8929 Other chronic pain: Secondary | ICD-10-CM | POA: Insufficient documentation

## 2020-01-28 DIAGNOSIS — M5417 Radiculopathy, lumbosacral region: Secondary | ICD-10-CM | POA: Diagnosis present

## 2020-01-28 DIAGNOSIS — M5030 Other cervical disc degeneration, unspecified cervical region: Secondary | ICD-10-CM | POA: Diagnosis present

## 2020-01-28 DIAGNOSIS — M5412 Radiculopathy, cervical region: Secondary | ICD-10-CM | POA: Insufficient documentation

## 2020-01-28 DIAGNOSIS — M79604 Pain in right leg: Secondary | ICD-10-CM

## 2020-01-28 DIAGNOSIS — M542 Cervicalgia: Secondary | ICD-10-CM

## 2020-01-28 DIAGNOSIS — M5481 Occipital neuralgia: Secondary | ICD-10-CM | POA: Diagnosis present

## 2020-01-28 DIAGNOSIS — G4486 Cervicogenic headache: Secondary | ICD-10-CM | POA: Diagnosis present

## 2020-01-28 DIAGNOSIS — R200 Anesthesia of skin: Secondary | ICD-10-CM | POA: Diagnosis present

## 2020-01-28 DIAGNOSIS — M5137 Other intervertebral disc degeneration, lumbosacral region: Secondary | ICD-10-CM

## 2020-01-28 DIAGNOSIS — R202 Paresthesia of skin: Secondary | ICD-10-CM | POA: Insufficient documentation

## 2020-01-28 DIAGNOSIS — M545 Low back pain, unspecified: Secondary | ICD-10-CM | POA: Diagnosis present

## 2020-01-28 DIAGNOSIS — M47817 Spondylosis without myelopathy or radiculopathy, lumbosacral region: Secondary | ICD-10-CM

## 2020-01-28 LAB — COMP. METABOLIC PANEL (12)
AST: 16 IU/L (ref 0–40)
Albumin/Globulin Ratio: 1.6 (ref 1.2–2.2)
Albumin: 4.4 g/dL (ref 3.8–4.9)
Alkaline Phosphatase: 64 IU/L (ref 44–121)
BUN/Creatinine Ratio: 21 (ref 9–23)
BUN: 14 mg/dL (ref 6–24)
Bilirubin Total: 0.3 mg/dL (ref 0.0–1.2)
Calcium: 9.5 mg/dL (ref 8.7–10.2)
Chloride: 104 mmol/L (ref 96–106)
Creatinine, Ser: 0.67 mg/dL (ref 0.57–1.00)
GFR calc Af Amer: 114 mL/min/{1.73_m2} (ref >59)
GFR calc non Af Amer: 99 mL/min/{1.73_m2} (ref >59)
Globulin, Total: 2.8 g/dL (ref 1.5–4.5)
Glucose: 85 mg/dL (ref 65–99)
Potassium: 4.3 mmol/L (ref 3.5–5.2)
Sodium: 141 mmol/L (ref 134–144)
Total Protein: 7.2 g/dL (ref 6.0–8.5)

## 2020-01-28 LAB — 25-HYDROXY VITAMIN D LCMS D2+D3
25-Hydroxy, Vitamin D-2: <1 ng/mL
25-Hydroxy, Vitamin D-3: 29 ng/mL
25-Hydroxy, Vitamin D: 29 ng/mL — ABNORMAL LOW

## 2020-01-28 LAB — MAGNESIUM: Magnesium: 2 mg/dL (ref 1.6–2.3)

## 2020-01-28 LAB — SEDIMENTATION RATE: Sed Rate: 21 mm/hr (ref 0–40)

## 2020-01-28 LAB — VITAMIN B12: Vitamin B-12: 373 pg/mL (ref 232–1245)

## 2020-01-28 LAB — C-REACTIVE PROTEIN: CRP: 3 mg/L (ref 0–10)

## 2020-02-02 LAB — COMPLIANCE DRUG ANALYSIS, UR

## 2020-02-02 NOTE — Progress Notes (Signed)
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 Milinda Pointer, MD [facility]  Showing recent visits within past 90 days and meeting all other requirements Today's Visits Date Type Provider Dept  02/03/20 Office Visit Milinda Pointer, MD [facility]  Showing today's visits and meeting all other requirements Future Appointments Date Type Provider Dept  02/09/20 Appointment Milinda Pointer, MD [facility]  Showing future appointments within next 90 days and meeting all other requirements  Primary Care Physician: Glean Hess, MD Note by: Gaspar Cola, MD Date: 02/03/2020; Time: 9:52 AM

## 2020-02-03 ENCOUNTER — Ambulatory Visit: Payer: BC Managed Care – PPO | Attending: Pain Medicine | Admitting: Pain Medicine

## 2020-02-03 ENCOUNTER — Encounter: Payer: Self-pay | Admitting: Pain Medicine

## 2020-02-03 ENCOUNTER — Other Ambulatory Visit: Payer: Self-pay

## 2020-02-03 VITALS — BP 132/82 | HR 73 | Temp 97.4°F | Resp 16 | Ht 65.0 in | Wt 200.0 lb

## 2020-02-03 DIAGNOSIS — Z87898 Personal history of other specified conditions: Secondary | ICD-10-CM

## 2020-02-03 DIAGNOSIS — M4802 Spinal stenosis, cervical region: Secondary | ICD-10-CM | POA: Diagnosis present

## 2020-02-03 DIAGNOSIS — M545 Low back pain, unspecified: Secondary | ICD-10-CM

## 2020-02-03 DIAGNOSIS — F1991 Other psychoactive substance use, unspecified, in remission: Secondary | ICD-10-CM

## 2020-02-03 DIAGNOSIS — R892 Abnormal level of other drugs, medicaments and biological substances in specimens from other organs, systems and tissues: Secondary | ICD-10-CM

## 2020-02-03 DIAGNOSIS — M79604 Pain in right leg: Secondary | ICD-10-CM

## 2020-02-03 DIAGNOSIS — M47817 Spondylosis without myelopathy or radiculopathy, lumbosacral region: Secondary | ICD-10-CM

## 2020-02-03 DIAGNOSIS — E559 Vitamin D deficiency, unspecified: Secondary | ICD-10-CM

## 2020-02-03 DIAGNOSIS — G8929 Other chronic pain: Secondary | ICD-10-CM | POA: Diagnosis present

## 2020-02-03 DIAGNOSIS — M542 Cervicalgia: Secondary | ICD-10-CM

## 2020-02-03 DIAGNOSIS — G894 Chronic pain syndrome: Secondary | ICD-10-CM

## 2020-02-03 DIAGNOSIS — M47812 Spondylosis without myelopathy or radiculopathy, cervical region: Secondary | ICD-10-CM

## 2020-02-03 DIAGNOSIS — M47816 Spondylosis without myelopathy or radiculopathy, lumbar region: Secondary | ICD-10-CM | POA: Diagnosis present

## 2020-02-03 DIAGNOSIS — F1291 Cannabis use, unspecified, in remission: Secondary | ICD-10-CM

## 2020-02-03 MED ORDER — ERGOCALCIFEROL 1.25 MG (50000 UT) PO CAPS: 50000.0000 [IU] | ORAL_CAPSULE | ORAL | 0 refills | Status: DC

## 2020-02-03 MED ORDER — VITAMIN D3 125 MCG (5000 UT) PO CAPS: 1.0000 | ORAL_CAPSULE | Freq: Every day | ORAL | 2 refills | Status: DC

## 2020-02-03 NOTE — Patient Instructions (Addendum)
____________________________________________________________________________________________  Preparing for Procedure with Sedation  Procedure appointments are limited to planned procedures: . No Prescription Refills. . No disability issues will be discussed. . No medication changes will be discussed.  Instructions: . Oral Intake: Do not eat or drink anything for at least 8 hours prior to your procedure. (Exception: Blood Pressure Medication. See below.) . Transportation: Unless otherwise stated by your physician, you may drive yourself after the procedure. . Blood Pressure Medicine: Do not forget to take your blood pressure medicine with a sip of water the morning of the procedure. If your Diastolic (lower reading)is above 100 mmHg, elective cases will be cancelled/rescheduled. . Blood thinners: These will need to be stopped for procedures. Notify our staff if you are taking any blood thinners. Depending on which one you take, there will be specific instructions on how and when to stop it. . Diabetics on insulin: Notify the staff so that you can be scheduled 1st case in the morning. If your diabetes requires high dose insulin, take only  of your normal insulin dose the morning of the procedure and notify the staff that you have done so. . Preventing infections: Shower with an antibacterial soap the morning of your procedure. . Build-up your immune system: Take 1000 mg of Vitamin C with every meal (3 times a day) the day prior to your procedure. . Antibiotics: Inform the staff if you have a condition or reason that requires you to take antibiotics before dental procedures. . Pregnancy: If you are pregnant, call and cancel the procedure. . Sickness: If you have a cold, fever, or any active infections, call and cancel the procedure. . Arrival: You must be in the facility at least 30 minutes prior to your scheduled procedure. . Children: Do not bring children with you. . Dress appropriately:  Bring dark clothing that you would not mind if they get stained. . Valuables: Do not bring any jewelry or valuables.  Reasons to call and reschedule or cancel your procedure: (Following these recommendations will minimize the risk of a serious complication.) . Surgeries: Avoid having procedures within 2 weeks of any surgery. (Avoid for 2 weeks before or after any surgery). . Flu Shots: Avoid having procedures within 2 weeks of a flu shots or . (Avoid for 2 weeks before or after immunizations). . Barium: Avoid having a procedure within 7-10 days after having had a radiological study involving the use of radiological contrast. (Myelograms, Barium swallow or enema study). . Heart attacks: Avoid any elective procedures or surgeries for the initial 6 months after a "Myocardial Infarction" (Heart Attack). . Blood thinners: It is imperative that you stop these medications before procedures. Let us know if you if you take any blood thinner.  . Infection: Avoid procedures during or within two weeks of an infection (including chest colds or gastrointestinal problems). Symptoms associated with infections include: Localized redness, fever, chills, night sweats or profuse sweating, burning sensation when voiding, cough, congestion, stuffiness, runny nose, sore throat, diarrhea, nausea, vomiting, cold or Flu symptoms, recent or current infections. It is specially important if the infection is over the area that we intend to treat. . Heart and lung problems: Symptoms that may suggest an active cardiopulmonary problem include: cough, chest pain, breathing difficulties or shortness of breath, dizziness, ankle swelling, uncontrolled high or unusually low blood pressure, and/or palpitations. If you are experiencing any of these symptoms, cancel your procedure and contact your primary care physician for an evaluation.  Remember:  Regular Business hours are:    Monday to Thursday 8:00 AM to 4:00 PM  Provider's  Schedule: Gerry Heaphy, MD:  Procedure days: Tuesday and Thursday 7:30 AM to 4:00 PM  Bilal Lateef, MD:  Procedure days: Monday and Wednesday 7:30 AM to 4:00 PM ____________________________________________________________________________________________   ____________________________________________________________________________________________  General Risks and Possible Complications  Patient Responsibilities: It is important that you read this as it is part of your informed consent. It is our duty to inform you of the risks and possible complications associated with treatments offered to you. It is your responsibility as a patient to read this and to ask questions about anything that is not clear or that you believe was not covered in this document.  Patient's Rights: You have the right to refuse treatment. You also have the right to change your mind, even after initially having agreed to have the treatment done. However, under this last option, if you wait until the last second to change your mind, you may be charged for the materials used up to that point.  Introduction: Medicine is not an exact science. Everything in Medicine, including the lack of treatment(s), carries the potential for danger, harm, or loss (which is by definition: Risk). In Medicine, a complication is a secondary problem, condition, or disease that can aggravate an already existing one. All treatments carry the risk of possible complications. The fact that a side effects or complications occurs, does not imply that the treatment was conducted incorrectly. It must be clearly understood that these can happen even when everything is done following the highest safety standards.  No treatment: You can choose not to proceed with the proposed treatment alternative. The "PRO(s)" would include: avoiding the risk of complications associated with the therapy. The "CON(s)" would include: not getting any of the treatment  benefits. These benefits fall under one of three categories: diagnostic; therapeutic; and/or palliative. Diagnostic benefits include: getting information which can ultimately lead to improvement of the disease or symptom(s). Therapeutic benefits are those associated with the successful treatment of the disease. Finally, palliative benefits are those related to the decrease of the primary symptoms, without necessarily curing the condition (example: decreasing the pain from a flare-up of a chronic condition, such as incurable terminal cancer).  General Risks and Complications: These are associated to most interventional treatments. They can occur alone, or in combination. They fall under one of the following six (6) categories: no benefit or worsening of symptoms; bleeding; infection; nerve damage; allergic reactions; and/or death. 1. No benefits or worsening of symptoms: In Medicine there are no guarantees, only probabilities. No healthcare provider can ever guarantee that a medical treatment will work, they can only state the probability that it may. Furthermore, there is always the possibility that the condition may worsen, either directly, or indirectly, as a consequence of the treatment. 2. Bleeding: This is more common if the patient is taking a blood thinner, either prescription or over the counter (example: Goody Powders, Fish oil, Aspirin, Garlic, etc.), or if suffering a condition associated with impaired coagulation (example: Hemophilia, cirrhosis of the liver, low platelet counts, etc.). However, even if you do not have one on these, it can still happen. If you have any of these conditions, or take one of these drugs, make sure to notify your treating physician. 3. Infection: This is more common in patients with a compromised immune system, either due to disease (example: diabetes, cancer, human immunodeficiency virus [HIV], etc.), or due to medications or treatments (example: therapies used to treat  cancer and   rheumatological diseases). However, even if you do not have one on these, it can still happen. If you have any of these conditions, or take one of these drugs, make sure to notify your treating physician. 4. Nerve Damage: This is more common when the treatment is an invasive one, but it can also happen with the use of medications, such as those used in the treatment of cancer. The damage can occur to small secondary nerves, or to large primary ones, such as those in the spinal cord and brain. This damage may be temporary or permanent and it may lead to impairments that can range from temporary numbness to permanent paralysis and/or brain death. 5. Allergic Reactions: Any time a substance or material comes in contact with our body, there is the possibility of an allergic reaction. These can range from a mild skin rash (contact dermatitis) to a severe systemic reaction (anaphylactic reaction), which can result in death. 6. Death: In general, any medical intervention can result in death, most of the time due to an unforeseen complication. ____________________________________________________________________________________________  Facet Blocks Patient Information  Description: The facets are joints in the spine between the vertebrae.  Like any joints in the body, facets can become irritated and painful.  Arthritis can also effect the facets.  By injecting steroids and local anesthetic in and around these joints, we can temporarily block the nerve supply to them.  Steroids act directly on irritated nerves and tissues to reduce selling and inflammation which often leads to decreased pain.  Facet blocks may be done anywhere along the spine from the neck to the low back depending upon the location of your pain.   After numbing the skin with local anesthetic (like Novocaine), a small needle is passed onto the facet joints under x-ray guidance.  You may experience a sensation of pressure while this is  being done.  The entire block usually lasts about 15-25 minutes.   Conditions which may be treated by facet blocks:   Low back/buttock pain  Neck/shoulder pain  Certain types of headaches  Preparation for the injection:  1. Do not eat any solid food or dairy products within 8 hours of your appointment. 2. You may drink clear liquid up to 3 hours before appointment.  Clear liquids include water, black coffee, juice or soda.  No milk or cream please. 3. You may take your regular medication, including pain medications, with a sip of water before your appointment.  Diabetics should hold regular insulin (if taken separately) and take 1/2 normal NPH dose the morning of the procedure.  Carry some sugar containing items with you to your appointment. 4. A driver must accompany you and be prepared to drive you home after your procedure. 5. Bring all your current medications with you. 6. An IV may be inserted and sedation may be given at the discretion of the physician. 7. A blood pressure cuff, EKG and other monitors will often be applied during the procedure.  Some patients may need to have extra oxygen administered for a short period. 8. You will be asked to provide medical information, including your allergies and medications, prior to the procedure.  We must know immediately if you are taking blood thinners (like Coumadin/Warfarin) or if you are allergic to IV iodine contrast (dye).  We must know if you could possible be pregnant.  Possible side-effects:   Bleeding from needle site  Infection (rare, may require surgery)  Nerve injury (rare)  Numbness & tingling (temporary)  Difficulty   urinating (rare, temporary)  Spinal headache (a headache worse with upright posture)  Light-headedness (temporary)  Pain at injection site (serveral days)  Decreased blood pressure (rare, temporary)  Weakness in arm/leg (temporary)  Pressure sensation in back/neck (temporary)   Call if you  experience:   Fever/chills associated with headache or increased back/neck pain  Headache worsened by an upright position  New onset, weakness or numbness of an extremity below the injection site  Hives or difficulty breathing (go to the emergency room)  Inflammation or drainage at the injection site(s)  Severe back/neck pain greater than usual  New symptoms which are concerning to you  Please note:  Although the local anesthetic injected can often make your back or neck feel good for several hours after the injection, the pain will likely return. It takes 3-7 days for steroids to work.  You may not notice any pain relief for at least one week.  If effective, we will often do a series of 2-3 injections spaced 3-6 weeks apart to maximally decrease your pain.  After the initial series, you may be a candidate for a more permanent nerve block of the facets.  If you have any questions, please call [phone] [facility] 

## 2020-02-08 NOTE — Progress Notes (Signed)
PROVIDER NOTE: Information contained herein reflects review and annotations entered in association with encounter. Interpretation of such information and data should be left to medically-trained personnel. Information provided to patient can be located elsewhere in the medical record under "Patient Instructions". Document created using STT-dictation technology, any transcriptional errors that may result from process are unintentional.    Patient: Jade Harris  Service Category: Procedure  Provider: Gaspar Cola, MD  DOB: 12/12/1965  DOS: 02/09/2020  Location: [facility]  MRN: [id number]  Setting: Ambulatory - outpatient  Referring Provider: Glean Hess, MD  Type: Established Patient  Specialty: Interventional Pain Management  PCP: Glean Hess, MD   Primary Reason for Visit: Interventional Pain Management Treatment. CC: Back Pain  Procedure:          Anesthesia, Analgesia, Anxiolysis:  Type: Lumbar Facet, Medial Branch Block(s) #1  Primary Purpose: Diagnostic Region: Posterolateral Lumbosacral Spine Level: L2, L3, L4, L5, & S1 Medial Branch Level(s). Injecting these levels blocks the L3-4, L4-5, and L5-S1 lumbar facet joints. Laterality: Bilateral  Type: Moderate (Conscious) Sedation combined with Local Anesthesia Indication(s): Analgesia and Anxiety Route: Intravenous (IV) IV Access: Secured Sedation: Meaningful verbal contact was maintained at all times during the procedure  Local Anesthetic: Lidocaine 1-2%  Position: Prone   Indications: 1. Lumbosacral facet joint syndrome (Bilateral) (R>L)   2. Lumbar facet hypertrophy   3. Spondylosis without myelopathy or radiculopathy, lumbosacral region   4. Chronic low back pain (1ry area of Pain) (Bilateral) (R>L) w/o sciatica   5. DDD (degenerative disc disease), lumbosacral    Pain Score: Pre-procedure: 4 /10 Post-procedure: 0-No pain/10   Pre-op H&P Assessment:  Jade Harris is a 55 y.o. (year  old), female patient, seen today for interventional treatment. She  has a past surgical history that includes Nasal sinus surgery (04/2014); Tonsillectomy; Appendectomy; Total abdominal hysterectomy w/ bilateral salpingoophorectomy; Mouth surgery; and Abdominal hysterectomy. Jade Harris has a current medication list which includes the following prescription(s): b complex vitamins, calcipotriene-betameth diprop, cholecalciferol, vitamin d3, duloxetine, ergocalciferol, estradiol, fluticasone, ibuprofen, multiple vitamin, multiple vitamins-minerals, and triamterene-hydrochlorothiazide, and the following Facility-Administered Medications: fentanyl and midazolam. Her primarily concern today is the Back Pain  Initial Vital Signs:  Pulse/HCG Rate: 73ECG Heart Rate: 68 Temp: (!) 97.2 F (36.2 C) Resp: 16 BP: (!) 152/97 SpO2: 95 %  BMI: Estimated body mass index is 33.28 kg/m as calculated from the following:   Height as of this encounter: 5\' 5"  (1.651 m).   Weight as of this encounter: 200 lb (90.7 kg).  Risk Assessment: Allergies: Reviewed. She is allergic to sulfa antibiotics.  Allergy Precautions: None required Coagulopathies: Reviewed. None identified.  Blood-thinner therapy: None at this time Active Infection(s): Reviewed. None identified. Jade Harris is afebrile  Site Confirmation: Jade Harris was asked to confirm the procedure and laterality before marking the site Procedure checklist: Completed Consent: Before the procedure and under the influence of no sedative(s), amnesic(s), or anxiolytics, the patient was informed of the treatment options, risks and possible complications. To fulfill our ethical and legal obligations, as recommended by the American Medical Association's Code of Ethics, I have informed the patient of my clinical impression; the nature and purpose of the treatment or procedure; the risks, benefits, and possible complications of the intervention; the alternatives,  including doing nothing; the risk(s) and benefit(s) of the alternative treatment(s) or procedure(s); and the risk(s) and benefit(s) of doing nothing. The patient was provided information about the general risks and possible complications associated  with the procedure. These may include, but are not limited to: failure to achieve desired goals, infection, bleeding, organ or nerve damage, allergic reactions, paralysis, and death. In addition, the patient was informed of those risks and complications associated to Spine-related procedures, such as failure to decrease pain; infection (i.e.: Meningitis, epidural or intraspinal abscess); bleeding (i.e.: epidural hematoma, subarachnoid hemorrhage, or any other type of intraspinal or peri-dural bleeding); organ or nerve damage (i.e.: Any type of peripheral nerve, nerve root, or spinal cord injury) with subsequent damage to sensory, motor, and/or autonomic systems, resulting in permanent pain, numbness, and/or weakness of one or several areas of the body; allergic reactions; (i.e.: anaphylactic reaction); and/or death. Furthermore, the patient was informed of those risks and complications associated with the medications. These include, but are not limited to: allergic reactions (i.e.: anaphylactic or anaphylactoid reaction(s)); adrenal axis suppression; blood sugar elevation that in diabetics may result in ketoacidosis or comma; water retention that in patients with history of congestive heart failure may result in shortness of breath, pulmonary edema, and decompensation with resultant heart failure; weight gain; swelling or edema; medication-induced neural toxicity; particulate matter embolism and blood vessel occlusion with resultant organ, and/or nervous system infarction; and/or aseptic necrosis of one or more joints. Finally, the patient was informed that Medicine is not an exact science; therefore, there is also the possibility of unforeseen or unpredictable risks  and/or possible complications that may result in a catastrophic outcome. The patient indicated having understood very clearly. We have given the patient no guarantees and we have made no promises. Enough time was given to the patient to ask questions, all of which were answered to the patient's satisfaction. Ms. Burrowes has indicated that she wanted to continue with the procedure. Attestation: I, the ordering provider, attest that I have discussed with the patient the benefits, risks, side-effects, alternatives, likelihood of achieving goals, and potential problems during recovery for the procedure that I have provided informed consent. Date   Time: 02/09/2020  8:02 AM  Pre-Procedure Preparation:  Monitoring: As per clinic protocol. Respiration, ETCO2, SpO2, BP, heart rate and rhythm monitor placed and checked for adequate function Safety Precautions: Patient was assessed for positional comfort and pressure points before starting the procedure. Time-out: I initiated and conducted the "Time-out" before starting the procedure, as per protocol. The patient was asked to participate by confirming the accuracy of the "Time Out" information. Verification of the correct person, site, and procedure were performed and confirmed by me, the nursing staff, and the patient. "Time-out" conducted as per Joint Commission's Universal Protocol (UP.01.01.01). Time: [phone]  Description of Procedure:          Laterality: Bilateral. The procedure was performed in identical fashion on both sides. Levels:  L2, L3, L4, L5, & S1 Medial Branch Level(s) Area Prepped: Posterior Lumbosacral Region DuraPrep (Iodine Povacrylex [0.7% available iodine] and Isopropyl Alcohol, 74% w/w) Safety Precautions: Aspiration looking for blood return was conducted prior to all injections. At no point did we inject any substances, as a needle was being advanced. Before injecting, the patient was told to immediately notify me if she was experiencing  any new onset of "ringing in the ears, or metallic taste in the mouth". No attempts were made at seeking any paresthesias. Safe injection practices and needle disposal techniques used. Medications properly checked for expiration dates. SDV (single dose vial) medications used. After the completion of the procedure, all disposable equipment used was discarded in the proper designated medical waste containers. Local Anesthesia:  Protocol guidelines were followed. The patient was positioned over the fluoroscopy table. The area was prepped in the usual manner. The time-out was completed. The target area was identified using fluoroscopy. A 12-in long, straight, sterile hemostat was used with fluoroscopic guidance to locate the targets for each level blocked. Once located, the skin was marked with an approved surgical skin marker. Once all sites were marked, the skin (epidermis, dermis, and hypodermis), as well as deeper tissues (fat, connective tissue and muscle) were infiltrated with a small amount of a short-acting local anesthetic, loaded on a 10cc syringe with a 25G, 1.5-in  Needle. An appropriate amount of time was allowed for local anesthetics to take effect before proceeding to the next step. Local Anesthetic: Lidocaine 2.0% The unused portion of the local anesthetic was discarded in the proper designated containers. Technical explanation of process:  L2 Medial Branch Nerve Block (MBB): The target area for the L2 medial branch is at the junction of the postero-lateral aspect of the superior articular process and the superior, posterior, and medial edge of the transverse process of L3. Under fluoroscopic guidance, a Quincke needle was inserted until contact was made with os over the superior postero-lateral aspect of the pedicular shadow (target area). After negative aspiration for blood, 0.5 mL of the nerve block solution was injected without difficulty or complication. The needle was removed intact. L3 Medial  Branch Nerve Block (MBB): The target area for the L3 medial branch is at the junction of the postero-lateral aspect of the superior articular process and the superior, posterior, and medial edge of the transverse process of L4. Under fluoroscopic guidance, a Quincke needle was inserted until contact was made with os over the superior postero-lateral aspect of the pedicular shadow (target area). After negative aspiration for blood, 0.5 mL of the nerve block solution was injected without difficulty or complication. The needle was removed intact. L4 Medial Branch Nerve Block (MBB): The target area for the L4 medial branch is at the junction of the postero-lateral aspect of the superior articular process and the superior, posterior, and medial edge of the transverse process of L5. Under fluoroscopic guidance, a Quincke needle was inserted until contact was made with os over the superior postero-lateral aspect of the pedicular shadow (target area). After negative aspiration for blood, 0.5 mL of the nerve block solution was injected without difficulty or complication. The needle was removed intact. L5 Medial Branch Nerve Block (MBB): The target area for the L5 medial branch is at the junction of the postero-lateral aspect of the superior articular process and the superior, posterior, and medial edge of the sacral ala. Under fluoroscopic guidance, a Quincke needle was inserted until contact was made with os over the superior postero-lateral aspect of the pedicular shadow (target area). After negative aspiration for blood, 0.5 mL of the nerve block solution was injected without difficulty or complication. The needle was removed intact. S1 Medial Branch Nerve Block (MBB): The target area for the S1 medial branch is at the posterior and inferior 6 o'clock position of the L5-S1 facet joint. Under fluoroscopic guidance, the Quincke needle inserted for the L5 MBB was redirected until contact was made with os over the inferior  and postero aspect of the sacrum, at the 6 o' clock position under the L5-S1 facet joint (Target area). After negative aspiration for blood, 0.5 mL of the nerve block solution was injected without difficulty or complication. The needle was removed intact.  Nerve block solution: 0.2% PF-Ropivacaine + Triamcinolone (40  mg/mL) diluted to a final concentration of 4 mg of Triamcinolone/mL of Ropivacaine The unused portion of the solution was discarded in the proper designated containers. Procedural Needles: 22-gauge, 3.5-inch, Quincke needles used for all levels.  Once the entire procedure was completed, the treated area was cleaned, making sure to leave some of the prepping solution back to take advantage of its long term bactericidal properties.   Illustration of the posterior view of the lumbar spine and the posterior neural structures. Laminae of L2 through S1 are labeled. DPRL5, dorsal primary ramus of L5; DPRS1, dorsal primary ramus of S1; DPR3, dorsal primary ramus of L3; FJ, facet (zygapophyseal) joint L3-L4; I, inferior articular process of L4; LB1, lateral branch of dorsal primary ramus of L1; IAB, inferior articular branches from L3 medial branch (supplies L4-L5 facet joint); IBP, intermediate branch plexus; MB3, medial branch of dorsal primary ramus of L3; NR3, third lumbar nerve root; S, superior articular process of L5; SAB, superior articular branches from L4 (supplies L4-5 facet joint also); TP3, transverse process of L3.  Vitals:   02/09/20 0852 02/09/20 0902 02/09/20 0912 02/09/20 0923  BP: (!) 132/91 (!) 143/94 133/90 132/89  Pulse:      Resp: 16 15 19 15   Temp:  (!) 97.2 F (36.2 C)  (!) 97.3 F (36.3 C)  SpO2: 96% 100% 100% 100%  Weight:      Height:         Start Time: 0843 hrs. End Time: 0852 hrs.  Imaging Guidance (Spinal):          Type of Imaging Technique: Fluoroscopy Guidance (Spinal) Indication(s): Assistance in needle guidance and placement for procedures  requiring needle placement in or near specific anatomical locations not easily accessible without such assistance. Exposure Time: Please see nurses notes. Contrast: None used. Fluoroscopic Guidance: I was personally present during the use of fluoroscopy. "Tunnel Vision Technique" used to obtain the best possible view of the target area. Parallax error corrected before commencing the procedure. "Direction-depth-direction" technique used to introduce the needle under continuous pulsed fluoroscopy. Once target was reached, antero-posterior, oblique, and lateral fluoroscopic projection used confirm needle placement in all planes. Images permanently stored in EMR. Interpretation: No contrast injected. I personally interpreted the imaging intraoperatively. Adequate needle placement confirmed in multiple planes. Permanent images saved into the patient's record.  Antibiotic Prophylaxis:   Anti-infectives (From admission, onward)   None     Indication(s): None identified  Post-operative Assessment:  Post-procedure Vital Signs:  Pulse/HCG Rate: 7366 Temp: (!) 97.3 F (36.3 C) Resp: 15 BP: 132/89 SpO2: 100 %  EBL: None  Complications: No immediate post-treatment complications observed by team, or reported by patient.  Note: The patient tolerated the entire procedure well. A repeat set of vitals were taken after the procedure and the patient was kept under observation following institutional policy, for this type of procedure. Post-procedural neurological assessment was performed, showing return to baseline, prior to discharge. The patient was provided with post-procedure discharge instructions, including a section on how to identify potential problems. Should any problems arise concerning this procedure, the patient was given instructions to immediately contact us, at any time, without hesitation. In any case, we plan to contact the patient by telephone for a follow-up status report regarding this  interventional procedure.  Comments:  No additional relevant information.  Plan of Care  Orders:  Orders Placed This Encounter  Procedures   LUMBAR FACET(MEDIAL BRANCH NERVE BLOCK) MBNB    Scheduling Instructions:  Procedure: Lumbar facet block (AKA.: Lumbosacral medial branch nerve block)     Side: Bilateral     Level: L3-4, L4-5, & L5-S1 Facets (L2, L3, L4, L5, & S1 Medial Branch Nerves)     Sedation: Patient's choice.     Timeframe: Today    Order Specific Question:   Where will this procedure be performed?    Answer:   [facility]   DG [facility] C-ARM 1-60 MIN NO REPORT    Intraoperative interpretation by procedural physician at [place].    Standing Status:   Standing    Number of Occurrences:   1    Order Specific Question:   Reason for exam:    Answer:   Assistance in needle guidance and placement for procedures requiring needle placement in or near specific anatomical locations not easily accessible without such assistance.   Informed Consent Details: Physician/Practitioner Attestation; Transcribe to consent form and obtain patient signature    Nursing Order: Transcribe to consent form and obtain patient signature. Note: Always confirm laterality of pain with Ms. Venable, before procedure.    Order Specific Question:   Physician/Practitioner attestation of informed consent for procedure/surgical case    Answer:   I, the physician/practitioner, attest that I have discussed with the patient the benefits, risks, side effects, alternatives, likelihood of achieving goals and potential problems during recovery for the procedure that I have provided informed consent.    Order Specific Question:   Procedure    Answer:   Lumbar Facet Block  under fluoroscopic guidance    Order Specific Question:   Physician/Practitioner performing the procedure    Answer:   Nyzier Boivin A. Dossie Arbour MD    Order Specific Question:   Indication/Reason    Answer:   Low Back  Pain, with our without leg pain, due to Facet Joint Arthralgia (Joint Pain) Spondylosis (Arthritis of the Spine), without myelopathy or radiculopathy (Nerve Damage).   Provide equipment / supplies at bedside    "Block Tray" (Disposable   single use) Needle type: SpinalSpinal Amount/quantity: 4 Size: Regular (3.5-inch) Gauge: 22G    Standing Status:   Standing    Number of Occurrences:   1    Order Specific Question:   Specify    Answer:   Block Tray   Chronic Opioid Analgesic:  None MME/day: 0 mg/day   Medications ordered for procedure: Meds ordered this encounter  Medications   lidocaine (XYLOCAINE) 2 % (with pres) injection 400 mg   lactated ringers infusion 1,000 mL   midazolam (VERSED) 5 MG/5ML injection 1-2 mg    Make sure Flumazenil is available in the pyxis when using this medication. If oversedation occurs, administer 0.2 mg IV over 15 sec. If after 45 sec no response, administer 0.2 mg again over 1 min; may repeat at 1 min intervals; not to exceed 4 doses (1 mg)   fentaNYL (SUBLIMAZE) injection 25-50 mcg    Make sure Narcan is available in the pyxis when using this medication. In the event of respiratory depression (RR< 8/min): Titrate NARCAN (naloxone) in increments of 0.1 to 0.2 mg IV at 2-3 minute intervals, until desired degree of reversal.   ropivacaine (PF) 2 mg/mL (0.2%) (NAROPIN) injection 18 mL   triamcinolone acetonide (KENALOG-40) injection 80 mg   Medications administered: We administered lidocaine, lactated ringers, midazolam, fentaNYL, ropivacaine (PF) 2 mg/mL (0.2%), and triamcinolone acetonide.  See the medical record for exact dosing, route, and time of administration.  Follow-up plan:  Return in about 2 weeks (around 02/23/2020) for (F2F), (PP) Follow-up.       Interventional management options: Planned, scheduled, and/or pending:    Diagnostic bilateral lumbar facet medial branch block #1    Considering:   Diagnostic bilateral lumbar facet  medial branch block #1  Possible bilateral lumbar facet RFA  Diagnostic bilateral cervical facet medial branch block #1  Possible bilateral cervical facet RFA  Diagnostic right caudal ESI + epidurogram    PRN Procedures:   None at this time    Recent Visits Date Type Provider Dept  02/03/20 Office Visit Milinda Pointer, MD [facility]  01/25/20 Office Visit Milinda Pointer, MD [facility]  Showing recent visits within past 90 days and meeting all other requirements Today's Visits Date Type Provider Dept  02/09/20 Procedure visit Milinda Pointer, MD [facility]  Showing today's visits and meeting all other requirements Future Appointments Date Type Provider Dept  02/24/20 Appointment Milinda Pointer, MD [facility]  Showing future appointments within next 90 days and meeting all other requirements  Disposition: Discharge home  Discharge (Date   Time): 02/09/2020; 0928 hrs.   Primary Care Physician: Glean Hess, MD Location: [facility] Note by: Gaspar Cola, MD Date: 02/09/2020; Time: 10:35 AM  Disclaimer:  Medicine is not an exact science. The only guarantee in medicine is that nothing is guaranteed. It is important to note that the decision to proceed with this intervention was based on the information collected from the patient. The Data and conclusions were drawn from the patient's questionnaire, the interview, and the physical examination. Because the information was provided in large part by the patient, it cannot be guaranteed that it has not been purposely or unconsciously manipulated. Every effort has been made to obtain as much relevant data as possible for this evaluation. It is important to note that the conclusions that lead to this procedure are derived in large part from the available data. Always take into account that the treatment will also be dependent on availability of  resources and existing treatment guidelines, considered by other Pain Management Practitioners as being common knowledge and practice, at the time of the intervention. For Medico-Legal purposes, it is also important to point out that variation in procedural techniques and pharmacological choices are the acceptable norm. The indications, contraindications, technique, and results of the above procedure should only be interpreted and judged by a Board-Certified Interventional Pain Specialist with extensive familiarity and expertise in the same exact procedure and technique.

## 2020-02-08 NOTE — Patient Instructions (Signed)

## 2020-02-09 ENCOUNTER — Encounter: Payer: Self-pay | Admitting: Pain Medicine

## 2020-02-09 ENCOUNTER — Other Ambulatory Visit: Payer: Self-pay

## 2020-02-09 ENCOUNTER — Ambulatory Visit (HOSPITAL_BASED_OUTPATIENT_CLINIC_OR_DEPARTMENT_OTHER): Payer: BC Managed Care – PPO | Admitting: Pain Medicine

## 2020-02-09 ENCOUNTER — Ambulatory Visit
Admission: RE | Admit: 2020-02-09 | Discharge: 2020-02-09 | Disposition: A | Discharge status: Home / Self Care | Payer: BC Managed Care – PPO | Source: Ambulatory Visit | Attending: Pain Medicine | Admitting: Pain Medicine

## 2020-02-09 VITALS — BP 132/89 | HR 73 | Temp 97.3°F | Resp 15 | Ht 65.0 in | Wt 200.0 lb

## 2020-02-09 DIAGNOSIS — M5137 Other intervertebral disc degeneration, lumbosacral region: Secondary | ICD-10-CM | POA: Diagnosis present

## 2020-02-09 DIAGNOSIS — G8929 Other chronic pain: Secondary | ICD-10-CM | POA: Diagnosis present

## 2020-02-09 DIAGNOSIS — M545 Low back pain, unspecified: Secondary | ICD-10-CM | POA: Insufficient documentation

## 2020-02-09 DIAGNOSIS — M47816 Spondylosis without myelopathy or radiculopathy, lumbar region: Secondary | ICD-10-CM | POA: Insufficient documentation

## 2020-02-09 DIAGNOSIS — M47817 Spondylosis without myelopathy or radiculopathy, lumbosacral region: Secondary | ICD-10-CM

## 2020-02-09 DIAGNOSIS — M51379 Other intervertebral disc degeneration, lumbosacral region without mention of lumbar back pain or lower extremity pain: Secondary | ICD-10-CM

## 2020-02-09 MED ORDER — TRIAMCINOLONE ACETONIDE 40 MG/ML IJ SUSP
INTRAMUSCULAR | Status: AC
  Filled 2020-02-09: qty 2

## 2020-02-09 MED ORDER — LIDOCAINE HCL (PF) 2 % IJ SOLN
INTRAMUSCULAR | Status: AC
  Filled 2020-02-09: qty 20

## 2020-02-09 MED ORDER — ROPIVACAINE HCL 2 MG/ML IJ SOLN
18.0000 mL | Freq: Once | INTRAMUSCULAR | Status: AC
  Administered 2020-02-09: 18 mL via PERINEURAL

## 2020-02-09 MED ORDER — MIDAZOLAM HCL 5 MG/5ML IJ SOLN
INTRAMUSCULAR | Status: AC
  Filled 2020-02-09: qty 5

## 2020-02-09 MED ORDER — ROPIVACAINE HCL 2 MG/ML IJ SOLN
INTRAMUSCULAR | Status: AC
  Filled 2020-02-09: qty 20

## 2020-02-09 MED ORDER — MIDAZOLAM HCL 5 MG/5ML IJ SOLN
1.0000 mg | INTRAMUSCULAR | Status: DC | PRN
  Administered 2020-02-09: 1 mg via INTRAVENOUS

## 2020-02-09 MED ORDER — LIDOCAINE HCL 2 % IJ SOLN
20.0000 mL | Freq: Once | INTRAMUSCULAR | Status: AC
  Administered 2020-02-09: 400 mg
  Filled 2020-02-09: qty 20

## 2020-02-09 MED ORDER — FENTANYL CITRATE (PF) 100 MCG/2ML IJ SOLN
INTRAMUSCULAR | Status: AC
  Filled 2020-02-09: qty 2

## 2020-02-09 MED ORDER — TRIAMCINOLONE ACETONIDE 40 MG/ML IJ SUSP
80.0000 mg | Freq: Once | INTRAMUSCULAR | Status: AC
  Administered 2020-02-09: 80 mg

## 2020-02-09 MED ORDER — LACTATED RINGERS IV SOLN
1000.0000 mL | Freq: Once | INTRAVENOUS | Status: AC
  Administered 2020-02-09: 1000 mL via INTRAVENOUS

## 2020-02-09 MED ORDER — FENTANYL CITRATE (PF) 100 MCG/2ML IJ SOLN
25.0000 ug | INTRAMUSCULAR | Status: DC | PRN
  Administered 2020-02-09: 50 ug via INTRAVENOUS

## 2020-02-09 NOTE — Progress Notes (Signed)
Safety precautions to be maintained throughout the outpatient stay will include: orient to surroundings, keep bed in low position, maintain call bell within reach at all times, provide assistance with transfer out of bed and ambulation.  

## 2020-02-10 ENCOUNTER — Telehealth: Payer: Self-pay

## 2020-02-10 NOTE — Telephone Encounter (Signed)
Called pp. No answer, left message to call if needed.

## 2020-02-15 ENCOUNTER — Other Ambulatory Visit: Payer: Self-pay | Admitting: Internal Medicine

## 2020-02-15 DIAGNOSIS — F324 Major depressive disorder, single episode, in partial remission: Secondary | ICD-10-CM

## 2020-02-22 NOTE — Progress Notes (Signed)
PROVIDER NOTE: Information contained herein reflects review and annotations entered in association with encounter. Interpretation of such information and data should be left to medically-trained personnel. Information provided to patient can be located elsewhere in the medical record under "Patient Instructions". Document created using STT-dictation technology, any transcriptional errors that may result from process are unintentional.    Patient: Jade Harris  Service Category: E/M  Provider: Gaspar Cola, MD  DOB: [birth year]  DOS: 02/24/2020  Specialty: Interventional Pain Management  MRN: [id number]  Setting: Ambulatory outpatient  PCP: Glean Hess, MD  Type: Established Patient    Referring Provider: Glean Hess, MD  Location: Office  Delivery: Face-to-face     HPI  Jade Harris, a 55 y.o. year old female, is here today because of her Chronic pain syndrome [G89.4]. Jade Harris primary complain today is Back Pain Last encounter: My last encounter with her was on 02/09/2020. Pertinent problems: Jade Harris has Degenerative arthritis of lumbar spine; Headache, chronic daily; Venous insufficiency of both lower extremities; Chronic pain syndrome; Chronic low back pain (1ry area of Pain) (Bilateral) (R>L) w/o sciatica; Lumbosacral facet joint syndrome (Bilateral) (R>L); Chronic musculoskeletal pain; Chronic lower extremity pain (2ry area of Pain) (Right); Chronic groin pain (Intermittent) (Right); Lumbosacral radiculopathy/radiculitis at L2 (Right); Chronic neck pain (Bilateral) (L>R); Cervicalgia; Cervicogenic headache (Left); Cervico-occipital neuralgia (Left); Radiculitis involving upper extremity (Left); Numbness and tingling of upper extremity (Left); DDD (degenerative disc disease), cervical; DDD (degenerative disc disease), lumbosacral; Discogenic low back pain; Lumbar facet hypertrophy; Cervical facet hypertrophy (Multilevel) (Bilateral); 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 Pain Assessment: Severity of Chronic pain is reported as a 2 /10. Location: Back Lower/right hip down back of leg to knee. Onset: More than a month ago. Quality: Aching,Constant,Pressure,Shooting,Sore,Sharp ("Like someone is standing on my back"). Timing: Constant. Modifying factor(s): rest, procedures. Vitals:  height is '5\' 5"'  (1.651 m) and weight is 200 lb (90.7 kg). Her temperature is 97 F (36.1 C) (abnormal). Her blood pressure is 149/91 (abnormal) and her pulse is 73. Her respiration is 16 and oxygen saturation is 100%.   Reason for encounter: post-procedure assessment.  Today I had a very long conversation with this patient regarding the diagnostic blocks.  I explained to her what the purpose of the local anesthetic and the steroids are within the diagnostic injections.  She indicated that she understood.  We went over the results of the diagnostic bilateral lumbar facet block #1 and she indicated having attained about 50% relief of the pain, even when he was numb.  This would suggest that only 50% of her pain is coming from the area of the facet joints.  Physical exam today demonstrated the patient not to have any left lumbar facet arthralgia today upon hyperextension on rotation on the [facility] maneuver.  However, she was positive on the right side.  Patrick maneuver was negative on the left side but she did test positive on the right side suggesting that we are dealing with an issue involving the right lumbar facets and the SI joint.  At this point, the plan is to bring the patient back for a right-sided diagnostic sacroiliac joint block under fluoroscopic guidance and IV sedation.  If this provides patient with 100% relief of the pain he  may suggest that there is some  significant involvement of the SI joints.  However, if she only gets 50% benefit from the diagnostic injection, this would suggest that we are dealing with an issue involving both sides.  All of this was explained to the patient in layman's terms and she understood and accepted.  Post-Procedure Evaluation  Procedure (02/09/2020): Diagnostic bilateral lumbar facet block #1 under fluoroscopic guidance and IV sedation Pre-procedure pain level: 4/10 Post-procedure: 0/10 (100% relief)  Sedation: Sedation provided.  Effectiveness during initial hour after procedure(Ultra-Short Term Relief): 50 %.  Local anesthetic used: Long-acting (4-6 hours) Effectiveness: Defined as any analgesic benefit obtained secondary to the administration of local anesthetics. This carries significant diagnostic value as to the etiological location, or anatomical origin, of the pain. Duration of benefit is expected to coincide with the duration of the local anesthetic used.  Effectiveness during initial 4-6 hours after procedure(Short-Term Relief): 50 %.  Long-term benefit: Defined as any relief past the pharmacologic duration of the local anesthetics.  Effectiveness past the initial 6 hours after procedure(Long-Term Relief): 50 % (2 weeks).  Current benefits: Defined as benefit that persist at this time.   Analgesia:  50% improved on the right side, but she seems to have 100% improvement on the left. Function: Somewhat improved ROM: Somewhat improved  Pharmacotherapy Assessment   Analgesic: None MME/day: 0 mg/day   Monitoring: [place] PMP: PDMP reviewed during this encounter.       Pharmacotherapy: No side-effects or adverse reactions reported. Compliance: No problems identified. Effectiveness: Clinically acceptable.  Ignatius Specking, RN  02/24/2020  9:57 AM  Sign when Signing Visit Safety precautions to be maintained throughout the outpatient stay will include: orient to surroundings,  keep bed in low position, maintain call bell within reach at all times, provide assistance with transfer out of bed and ambulation.     UDS:  Summary  Date Value Ref Range Status  01/25/2020 Note  Final    Comment:    ==================================================================== Compliance Drug Analysis, Ur ==================================================================== Test                             Result       Flag       Units  Drug Present and Declared for Prescription Verification   Duloxetine                     PRESENT      EXPECTED  Drug Present not Declared for Prescription Verification   Carboxy-THC                    272          UNEXPECTED ng/mg creat    Carboxy-THC is a metabolite of tetrahydrocannabinol (THC). Source of    THC is most commonly herbal marijuana or marijuana-based products,    but THC is also present in a scheduled prescription medication.    Trace amounts of THC can be present in hemp and cannabidiol (CBD)    products. This test is not intended to distinguish between delta-9-    tetrahydrocannabinol, the predominant form of THC in most herbal or    marijuana-based products, and delta-8-tetrahydrocannabinol.    Methocarbamol                  PRESENT      UNEXPECTED   Guaifenesin  PRESENT      UNEXPECTED    Guaifenesin may be administered as an over-the-counter or    prescription drug; it may also be present as a breakdown product of    methocarbamol.    Acetaminophen                  PRESENT      UNEXPECTED   Doxylamine                     PRESENT      UNEXPECTED   Dextromethorphan               PRESENT      UNEXPECTED   Dextrorphan/Levorphanol        PRESENT      UNEXPECTED    Dextrorphan is an expected metabolite of dextromethorphan, an over-    the-counter or prescription cough suppressant. Dextrorphan cannot be    distinguished from the scheduled prescription medication levorphanol    by the method used for  analysis.  Drug Absent but Declared for Prescription Verification   Ibuprofen                      Not Detected UNEXPECTED    Ibuprofen, as indicated in the declared medication list, is not    always detected even when used as directed.  ==================================================================== Test                      Result    Flag   Units      Ref Range   Creatinine              149              mg/dL      >=20 ==================================================================== Declared Medications:  The flagging and interpretation on this report are based on the  following declared medications.  Unexpected results may arise from  inaccuracies in the declared medications.   **Note: The testing scope of this panel includes these medications:   Duloxetine   **Note: The testing scope of this panel does not include small to  moderate amounts of these reported medications:   Ibuprofen   **Note: The testing scope of this panel does not include the  following reported medications:   Cholecalciferol  Estradiol  Fluticasone  Hydrochlorothiazide  Multivitamin  Topical  Triamterene  Vitamin B ==================================================================== For clinical consultation, please call [phone]. ====================================================================      ROS  Constitutional: Denies any fever or chills Gastrointestinal: No reported hemesis, hematochezia, vomiting, or acute GI distress Musculoskeletal: Denies any acute onset joint swelling, redness, loss of ROM, or weakness Neurological: No reported episodes of acute onset apraxia, aphasia, dysarthria, agnosia, amnesia, paralysis, loss of coordination, or loss of consciousness  Medication Review  B Complex Vitamins, Calcipotriene-Betameth Diprop, Cholecalciferol, DULoxetine, Multiple Vitamin, Multiple Vitamins-Minerals, Vitamin D3, ergocalciferol, estradiol, fluticasone, ibuprofen,  and triamterene-hydrochlorothiazide  History Review  Allergy: Jade Harris is allergic to sulfa antibiotics. Drug: Jade Harris  reports no history of drug use. Alcohol:  reports current alcohol use. Tobacco:  reports that she quit smoking about 33 years ago. She has never used smokeless tobacco. Social: Jade Harris  reports that she quit smoking about 33 years ago. She has never used smokeless tobacco. She reports current alcohol use. She reports that she does not use drugs. Medical:  has a past medical history of Cancer ([place]). Surgical: Jade Harris  has a  past surgical history that includes Nasal sinus surgery (04/2014); Tonsillectomy; Appendectomy; Total abdominal hysterectomy w/ bilateral salpingoophorectomy; Mouth surgery; and Abdominal hysterectomy. Family: family history includes Hypertension in her mother.  Laboratory Chemistry Profile   Renal Lab Results  Component Value Date   BUN 14 01/25/2020   CREATININE 0.67 01/25/2020   BCR 21 01/25/2020   GFRAA 114 01/25/2020   GFRNONAA 99 01/25/2020     Hepatic Lab Results  Component Value Date   AST 16 01/25/2020   ALT 12 11/07/2018   ALBUMIN 4.4 01/25/2020   ALKPHOS 64 01/25/2020     Electrolytes Lab Results  Component Value Date   NA 141 01/25/2020   K 4.3 01/25/2020   CL 104 01/25/2020   CALCIUM 9.5 01/25/2020   MG 2.0 01/25/2020     Bone Lab Results  Component Value Date   25OHVITD1 29 (L) 01/25/2020   25OHVITD2 <1.0 01/25/2020   25OHVITD3 29 01/25/2020     Inflammation (CRP: Acute Phase) (ESR: Chronic Phase) Lab Results  Component Value Date   CRP 3 01/25/2020   ESRSEDRATE 21 01/25/2020       Note: Above Lab results reviewed.  Recent Imaging Review  DG PAIN CLINIC C-ARM 1-60 MIN NO REPORT Fluoro was used, but no Radiologist interpretation will be provided.  Please refer to "NOTES" tab for provider progress note. Note: Reviewed        Physical Exam  General appearance: Well nourished, well  developed, and well hydrated. In no apparent acute distress Mental status: Alert, oriented x 3 (person, place, & time)       Respiratory: No evidence of acute respiratory distress Eyes: PERLA Vitals: BP (!) 149/91   Pulse 73   Temp (!) 97 F (36.1 C)   Resp 16   Ht '5\' 5"'  (1.651 m)   Wt 200 lb (90.7 kg)   SpO2 100%   BMI 33.28 kg/m  BMI: Estimated body mass index is 33.28 kg/m as calculated from the following:   Height as of this encounter: '5\' 5"'  (1.651 m).   Weight as of this encounter: 200 lb (90.7 kg). Ideal: Ideal body weight: 57 kg (125 lb 10.6 oz) Adjusted ideal body weight: 70.5 kg (155 lb 6.4 oz)  Assessment   Status Diagnosis  Controlled Improving Improving 1. Chronic pain syndrome   2. Lumbosacral facet joint syndrome (Bilateral) (R>L)   3. Chronic low back pain (1ry area of Pain) (Bilateral) (R>L) w/o sciatica   4. Chronic right sacroiliac joint pain   5. Enthesopathy of sacroiliac joint (Right)   6. Other spondylosis, sacral and sacrococcygeal region      Updated Problems: Problem  Chronic sacroiliac joint pain (Right)  Other Spondylosis, Sacral and Sacrococcygeal Region  Enthesopathy of sacroiliac joint (Right)    Plan of Care  Problem-specific:  No problem-specific Assessment & Plan notes found for this encounter.  Jade Harris has a current medication list which includes the following long-term medication(s): cholecalciferol, vitamin d3, duloxetine, ergocalciferol, estradiol, and fluticasone.  Pharmacotherapy (Medications Ordered): No orders of the defined types were placed in this encounter.  Orders:  Orders Placed This Encounter  Procedures  . SACROILIAC JOINT INJECTION    Standing Status:   Future    Standing Expiration Date:   03/26/2020    Scheduling Instructions:     Side: Right-sided     Sedation: Patient's choice.     Timeframe: ASAP    Order Specific Question:   Where will this procedure be  performed?    Answer:   [facility]   Follow-up plan:   Return for Procedure (w/ sedation): (R) SI BLK #1.     Interventional Therapies  Risk  Complexity Considerations:   WNL   Planned  Pending:   Diagnostic right SI joint block #1    Under consideration:   Diagnostic bilateral lumbar facet medial branch block #2  Possible bilateral lumbar facet RFA  Diagnostic right SI joint block #1  Possible right SI joint RFA  Diagnostic bilateral cervical facet medial branch block #1  Possible bilateral cervical facet RFA  Diagnostic right caudal ESI + epidurogram    Completed:   Diagnostic bilateral lumbar facet MBB x1 (02/09/2020)   Therapeutic  Palliative (PRN) options:   None established    Recent Visits Date Type Provider Dept  02/09/20 Procedure visit Milinda Pointer, MD [facility]  02/03/20 Office Visit Milinda Pointer, MD [facility]  01/25/20 Office Visit Milinda Pointer, MD [facility]  Showing recent visits within past 90 days and meeting all other requirements Today's Visits Date Type Provider Dept  02/24/20 Office Visit Milinda Pointer, MD [facility]  Showing today's visits and meeting all other requirements Future Appointments Date Type Provider Dept  03/01/20 Appointment Milinda Pointer, MD [facility]  Showing future appointments within next 90 days and meeting all other requirements  I discussed the assessment and treatment plan with the patient. The patient was provided an opportunity to ask questions and all were answered. The patient agreed with the plan and demonstrated an understanding of the instructions.  Patient advised to call back or seek an in-person evaluation if the symptoms or condition worsens.  Duration of encounter: 45 minutes.  Note by: Gaspar Cola, MD Date: 02/24/2020; Time: 10:52 AM

## 2020-02-24 ENCOUNTER — Encounter: Payer: Self-pay | Admitting: Pain Medicine

## 2020-02-24 ENCOUNTER — Ambulatory Visit: Payer: BC Managed Care – PPO | Attending: Pain Medicine | Admitting: Pain Medicine

## 2020-02-24 ENCOUNTER — Other Ambulatory Visit: Payer: Self-pay

## 2020-02-24 VITALS — BP 149/91 | HR 73 | Temp 97.0°F | Resp 16 | Ht 65.0 in | Wt 200.0 lb

## 2020-02-24 DIAGNOSIS — M47898 Other spondylosis, sacral and sacrococcygeal region: Secondary | ICD-10-CM | POA: Insufficient documentation

## 2020-02-24 DIAGNOSIS — G894 Chronic pain syndrome: Secondary | ICD-10-CM | POA: Diagnosis present

## 2020-02-24 DIAGNOSIS — M545 Low back pain, unspecified: Secondary | ICD-10-CM | POA: Diagnosis not present

## 2020-02-24 DIAGNOSIS — G8929 Other chronic pain: Secondary | ICD-10-CM | POA: Insufficient documentation

## 2020-02-24 DIAGNOSIS — M779 Enthesopathy, unspecified: Secondary | ICD-10-CM | POA: Diagnosis present

## 2020-02-24 DIAGNOSIS — M533 Sacrococcygeal disorders, not elsewhere classified: Secondary | ICD-10-CM | POA: Insufficient documentation

## 2020-02-24 DIAGNOSIS — M47817 Spondylosis without myelopathy or radiculopathy, lumbosacral region: Secondary | ICD-10-CM | POA: Diagnosis present

## 2020-02-24 NOTE — Patient Instructions (Addendum)
____________________________________________________________________________________________  Preparing for Procedure with Sedation  Procedure appointments are limited to planned procedures: . No Prescription Refills. . No disability issues will be discussed. . No medication changes will be discussed.  Instructions: . Oral Intake: Do not eat or drink anything for at least 8 hours prior to your procedure. (Exception: Blood Pressure Medication. See below.) . Transportation: Unless otherwise stated by your physician, you may drive yourself after the procedure. . Blood Pressure Medicine: Do not forget to take your blood pressure medicine with a sip of water the morning of the procedure. If your Diastolic (lower reading)is above 100 mmHg, elective cases will be cancelled/rescheduled. . Blood thinners: These will need to be stopped for procedures. Notify our staff if you are taking any blood thinners. Depending on which one you take, there will be specific instructions on how and when to stop it. . Diabetics on insulin: Notify the staff so that you can be scheduled 1st case in the morning. If your diabetes requires high dose insulin, take only  of your normal insulin dose the morning of the procedure and notify the staff that you have done so. . Preventing infections: Shower with an antibacterial soap the morning of your procedure. . Build-up your immune system: Take 1000 mg of Vitamin C with every meal (3 times a day) the day prior to your procedure. . Antibiotics: Inform the staff if you have a condition or reason that requires you to take antibiotics before dental procedures. . Pregnancy: If you are pregnant, call and cancel the procedure. . Sickness: If you have a cold, fever, or any active infections, call and cancel the procedure. . Arrival: You must be in the facility at least 30 minutes prior to your scheduled procedure. . Children: Do not bring children with you. . Dress appropriately:  Bring dark clothing that you would not mind if they get stained. . Valuables: Do not bring any jewelry or valuables.  Reasons to call and reschedule or cancel your procedure: (Following these recommendations will minimize the risk of a serious complication.) . Surgeries: Avoid having procedures within 2 weeks of any surgery. (Avoid for 2 weeks before or after any surgery). . Flu Shots: Avoid having procedures within 2 weeks of a flu shots or . (Avoid for 2 weeks before or after immunizations). . Barium: Avoid having a procedure within 7-10 days after having had a radiological study involving the use of radiological contrast. (Myelograms, Barium swallow or enema study). . Heart attacks: Avoid any elective procedures or surgeries for the initial 6 months after a "Myocardial Infarction" (Heart Attack). . Blood thinners: It is imperative that you stop these medications before procedures. Let us know if you if you take any blood thinner.  . Infection: Avoid procedures during or within two weeks of an infection (including chest colds or gastrointestinal problems). Symptoms associated with infections include: Localized redness, fever, chills, night sweats or profuse sweating, burning sensation when voiding, cough, congestion, stuffiness, runny nose, sore throat, diarrhea, nausea, vomiting, cold or Flu symptoms, recent or current infections. It is specially important if the infection is over the area that we intend to treat. . Heart and lung problems: Symptoms that may suggest an active cardiopulmonary problem include: cough, chest pain, breathing difficulties or shortness of breath, dizziness, ankle swelling, uncontrolled high or unusually low blood pressure, and/or palpitations. If you are experiencing any of these symptoms, cancel your procedure and contact your primary care physician for an evaluation.  Remember:  Regular Business hours are:    Monday to Thursday 8:00 AM to 4:00 PM  Provider's  Schedule: Marypat Kimmet, MD:  Procedure days: Tuesday and Thursday 7:30 AM to 4:00 PM  Bilal Lateef, MD:  Procedure days: Monday and Wednesday 7:30 AM to 4:00 PM ____________________________________________________________________________________________   ____________________________________________________________________________________________  General Risks and Possible Complications  Patient Responsibilities: It is important that you read this as it is part of your informed consent. It is our duty to inform you of the risks and possible complications associated with treatments offered to you. It is your responsibility as a patient to read this and to ask questions about anything that is not clear or that you believe was not covered in this document.  Patient's Rights: You have the right to refuse treatment. You also have the right to change your mind, even after initially having agreed to have the treatment done. However, under this last option, if you wait until the last second to change your mind, you may be charged for the materials used up to that point.  Introduction: Medicine is not an exact science. Everything in Medicine, including the lack of treatment(s), carries the potential for danger, harm, or loss (which is by definition: Risk). In Medicine, a complication is a secondary problem, condition, or disease that can aggravate an already existing one. All treatments carry the risk of possible complications. The fact that a side effects or complications occurs, does not imply that the treatment was conducted incorrectly. It must be clearly understood that these can happen even when everything is done following the highest safety standards.  No treatment: You can choose not to proceed with the proposed treatment alternative. The "PRO(s)" would include: avoiding the risk of complications associated with the therapy. The "CON(s)" would include: not getting any of the treatment  benefits. These benefits fall under one of three categories: diagnostic; therapeutic; and/or palliative. Diagnostic benefits include: getting information which can ultimately lead to improvement of the disease or symptom(s). Therapeutic benefits are those associated with the successful treatment of the disease. Finally, palliative benefits are those related to the decrease of the primary symptoms, without necessarily curing the condition (example: decreasing the pain from a flare-up of a chronic condition, such as incurable terminal cancer).  General Risks and Complications: These are associated to most interventional treatments. They can occur alone, or in combination. They fall under one of the following six (6) categories: no benefit or worsening of symptoms; bleeding; infection; nerve damage; allergic reactions; and/or death. 1. No benefits or worsening of symptoms: In Medicine there are no guarantees, only probabilities. No healthcare provider can ever guarantee that a medical treatment will work, they can only state the probability that it may. Furthermore, there is always the possibility that the condition may worsen, either directly, or indirectly, as a consequence of the treatment. 2. Bleeding: This is more common if the patient is taking a blood thinner, either prescription or over the counter (example: Goody Powders, Fish oil, Aspirin, Garlic, etc.), or if suffering a condition associated with impaired coagulation (example: Hemophilia, cirrhosis of the liver, low platelet counts, etc.). However, even if you do not have one on these, it can still happen. If you have any of these conditions, or take one of these drugs, make sure to notify your treating physician. 3. Infection: This is more common in patients with a compromised immune system, either due to disease (example: diabetes, cancer, human immunodeficiency virus [HIV], etc.), or due to medications or treatments (example: therapies used to treat  cancer and   rheumatological diseases). However, even if you do not have one on these, it can still happen. If you have any of these conditions, or take one of these drugs, make sure to notify your treating physician. 4. Nerve Damage: This is more common when the treatment is an invasive one, but it can also happen with the use of medications, such as those used in the treatment of cancer. The damage can occur to small secondary nerves, or to large primary ones, such as those in the spinal cord and brain. This damage may be temporary or permanent and it may lead to impairments that can range from temporary numbness to permanent paralysis and/or brain death. 5. Allergic Reactions: Any time a substance or material comes in contact with our body, there is the possibility of an allergic reaction. These can range from a mild skin rash (contact dermatitis) to a severe systemic reaction (anaphylactic reaction), which can result in death. 6. Death: In general, any medical intervention can result in death, most of the time due to an unforeseen complication. ____________________________________________________________________________________________  Preparing for your procedure (without sedation) Instructions: . Oral Intake: Do not eat or drink anything for at least 3 hours prior to your procedure. . Transportation: Unless otherwise stated by your physician, you may drive yourself after the procedure. . Blood Pressure Medicine: Take your blood pressure medicine with a sip of water the morning of the procedure. . Insulin: Take only  of your normal insulin dose. . Preventing infections: Shower with an antibacterial soap the morning of your procedure. . Build-up your immune system: Take 1000 mg of Vitamin C with every meal (3 times a day) the day prior to your procedure. . Pregnancy: If you are pregnant, call and cancel the procedure. . Sickness: If you have a cold, fever, or any active infections, call and cancel  the procedure. . Arrival: You must be in the facility at least 30 minutes prior to your scheduled procedure. . Children: Do not bring any children with you. . Dress appropriately: Bring dark clothing that you would not mind if they get stained. . Valuables: Do not bring any jewelry or valuables. Procedure appointments are reserved for interventional treatments only. Marland Kitchen No Prescription Refills. . No medication changes will be discussed during procedure appointments. . No disability issues will be discussed. Marland Kitchen

## 2020-02-24 NOTE — Progress Notes (Signed)
Safety precautions to be maintained throughout the outpatient stay will include: orient to surroundings, keep bed in low position, maintain call bell within reach at all times, provide assistance with transfer out of bed and ambulation.  

## 2020-03-01 ENCOUNTER — Ambulatory Visit (HOSPITAL_BASED_OUTPATIENT_CLINIC_OR_DEPARTMENT_OTHER): Payer: BC Managed Care – PPO | Admitting: Pain Medicine

## 2020-03-01 ENCOUNTER — Encounter: Payer: Self-pay | Admitting: Pain Medicine

## 2020-03-01 ENCOUNTER — Ambulatory Visit
Admission: RE | Admit: 2020-03-01 | Discharge: 2020-03-01 | Disposition: A | Discharge status: Home / Self Care | Payer: BC Managed Care – PPO | Source: Ambulatory Visit | Attending: Pain Medicine | Admitting: Pain Medicine

## 2020-03-01 ENCOUNTER — Other Ambulatory Visit: Payer: Self-pay

## 2020-03-01 VITALS — BP 158/93 | HR 73 | Temp 97.2°F | Resp 16 | Ht 65.0 in | Wt 200.0 lb

## 2020-03-01 DIAGNOSIS — Z90710 Acquired absence of both cervix and uterus: Secondary | ICD-10-CM | POA: Diagnosis not present

## 2020-03-01 DIAGNOSIS — M47818 Spondylosis without myelopathy or radiculopathy, sacral and sacrococcygeal region: Secondary | ICD-10-CM | POA: Diagnosis not present

## 2020-03-01 DIAGNOSIS — Z9079 Acquired absence of other genital organ(s): Secondary | ICD-10-CM | POA: Insufficient documentation

## 2020-03-01 DIAGNOSIS — Z79890 Hormone replacement therapy (postmenopausal): Secondary | ICD-10-CM | POA: Insufficient documentation

## 2020-03-01 DIAGNOSIS — Z90722 Acquired absence of ovaries, bilateral: Secondary | ICD-10-CM | POA: Insufficient documentation

## 2020-03-01 DIAGNOSIS — Z882 Allergy status to sulfonamides status: Secondary | ICD-10-CM | POA: Diagnosis not present

## 2020-03-01 DIAGNOSIS — G8929 Other chronic pain: Secondary | ICD-10-CM

## 2020-03-01 DIAGNOSIS — Z79899 Other long term (current) drug therapy: Secondary | ICD-10-CM | POA: Insufficient documentation

## 2020-03-01 DIAGNOSIS — M533 Sacrococcygeal disorders, not elsewhere classified: Secondary | ICD-10-CM | POA: Diagnosis not present

## 2020-03-01 DIAGNOSIS — M778 Other enthesopathies, not elsewhere classified: Secondary | ICD-10-CM | POA: Insufficient documentation

## 2020-03-01 DIAGNOSIS — M47898 Other spondylosis, sacral and sacrococcygeal region: Secondary | ICD-10-CM

## 2020-03-01 DIAGNOSIS — M4608 Spinal enthesopathy, sacral and sacrococcygeal region: Secondary | ICD-10-CM | POA: Insufficient documentation

## 2020-03-01 DIAGNOSIS — M779 Enthesopathy, unspecified: Secondary | ICD-10-CM

## 2020-03-01 MED ORDER — METHYLPREDNISOLONE ACETATE 80 MG/ML IJ SUSP
80.0000 mg | Freq: Once | INTRAMUSCULAR | Status: AC
  Administered 2020-03-01: 80 mg via INTRA_ARTICULAR
  Filled 2020-03-01: qty 1

## 2020-03-01 MED ORDER — MIDAZOLAM HCL 5 MG/5ML IJ SOLN: 1.0000 mg | INTRAMUSCULAR | Status: DC | PRN

## 2020-03-01 MED ORDER — LIDOCAINE HCL 2 % IJ SOLN
20.0000 mL | Freq: Once | INTRAMUSCULAR | Status: AC
  Administered 2020-03-01: 200 mg
  Filled 2020-03-01: qty 20

## 2020-03-01 MED ORDER — LIDOCAINE HCL (PF) 2 % IJ SOLN
INTRAMUSCULAR | Status: AC
  Filled 2020-03-01: qty 10

## 2020-03-01 MED ORDER — FENTANYL CITRATE (PF) 100 MCG/2ML IJ SOLN: 25.0000 ug | INTRAMUSCULAR | Status: DC | PRN

## 2020-03-01 MED ORDER — LACTATED RINGERS IV SOLN: 1000.0000 mL | Freq: Once | INTRAVENOUS | Status: DC

## 2020-03-01 MED ORDER — ROPIVACAINE HCL 2 MG/ML IJ SOLN
4.0000 mL | Freq: Once | INTRAMUSCULAR | Status: AC
  Administered 2020-03-01: 4 mL via INTRA_ARTICULAR
  Filled 2020-03-01: qty 10

## 2020-03-01 NOTE — Progress Notes (Signed)
PROVIDER NOTE: Information contained herein reflects review and annotations entered in association with encounter. Interpretation of such information and data should be left to medically-trained personnel. Information provided to patient can be located elsewhere in the medical record under "Patient Instructions". Document created using STT-dictation technology, any transcriptional errors that may result from process are unintentional.    Patient: Jade Harris  Service Category: Procedure  Provider: Gaspar Cola, MD  DOB: 1965-12-06  DOS: 03/01/2020  Location: [facility]  MRN: [id number]  Setting: Ambulatory - outpatient  Referring Provider: Glean Hess, MD  Type: Established Patient  Specialty: Interventional Pain Management  PCP: Glean Hess, MD   Primary Reason for Visit: Interventional Pain Management Treatment. CC: Hip Pain  Procedure:          Anesthesia, Analgesia, Anxiolysis:  Type: Diagnostic Sacroiliac Joint Steroid Injection #1  Region: Inferior Lumbosacral Region Level: PIIS (Posterior Inferior Iliac Spine) Laterality: Right  Type: Local Anesthesia Indication(s): Analgesia         Route: Infiltration (Bliss/IM) IV Access: Declined Sedation: Declined  Local Anesthetic: Lidocaine 1-2%  Position: Prone           Indications: 1. Chronic sacroiliac joint pain (Right)   2. Enthesopathy of sacroiliac joint (Right)   3. Other spondylosis, sacral and sacrococcygeal region    Pain Score: Pre-procedure: 3 /10 Post-procedure: 1 /10   Pre-op H&P Assessment:  Jade Harris is a 55 y.o. (year old), female patient, seen today for interventional treatment. She  has a past surgical history that includes Nasal sinus surgery (04/2014); Tonsillectomy; Appendectomy; Total abdominal hysterectomy w/ bilateral salpingoophorectomy; Mouth surgery; and Abdominal hysterectomy. Jade Harris has a current medication list which includes the following prescription(s):  acetaminophen, b complex vitamins, calcipotriene-betameth diprop, cholecalciferol, vitamin d3, duloxetine, ergocalciferol, estradiol, fluticasone, ibuprofen, multiple vitamin, multiple vitamins-minerals, and triamterene-hydrochlorothiazide. Her primarily concern today is the Hip Pain  Initial Vital Signs:  Pulse/HCG Rate: 73ECG Heart Rate: 67 Temp: (!) 97.2 F (36.2 C) Resp: 16 BP: (!) 148/91 SpO2: 100 %  BMI: Estimated body mass index is 33.28 kg/m as calculated from the following:   Height as of this encounter: 5\' 5"  (1.651 m).   Weight as of this encounter: 200 lb (90.7 kg).  Risk Assessment: Allergies: Reviewed. She is allergic to sulfa antibiotics.  Allergy Precautions: None required Coagulopathies: Reviewed. None identified.  Blood-thinner therapy: None at this time Active Infection(s): Reviewed. None identified. Jade Harris is afebrile  Site Confirmation: Jade Harris was asked to confirm the procedure and laterality before marking the site Procedure checklist: Completed Consent: Before the procedure and under the influence of no sedative(s), amnesic(s), or anxiolytics, the patient was informed of the treatment options, risks and possible complications. To fulfill our ethical and legal obligations, as recommended by the American Medical Association's Code of Ethics, I have informed the patient of my clinical impression; the nature and purpose of the treatment or procedure; the risks, benefits, and possible complications of the intervention; the alternatives, including doing nothing; the risk(s) and benefit(s) of the alternative treatment(s) or procedure(s); and the risk(s) and benefit(s) of doing nothing. The patient was provided information about the general risks and possible complications associated with the procedure. These may include, but are not limited to: failure to achieve desired goals, infection, bleeding, organ or nerve damage, allergic reactions, paralysis, and  death. In addition, the patient was informed of those risks and complications associated to the procedure, such as failure to decrease pain; infection; bleeding;  organ or nerve damage with subsequent damage to sensory, motor, and/or autonomic systems, resulting in permanent pain, numbness, and/or weakness of one or several areas of the body; allergic reactions; (i.e.: anaphylactic reaction); and/or death. Furthermore, the patient was informed of those risks and complications associated with the medications. These include, but are not limited to: allergic reactions (i.e.: anaphylactic or anaphylactoid reaction(s)); adrenal axis suppression; blood sugar elevation that in diabetics may result in ketoacidosis or comma; water retention that in patients with history of congestive heart failure may result in shortness of breath, pulmonary edema, and decompensation with resultant heart failure; weight gain; swelling or edema; medication-induced neural toxicity; particulate matter embolism and blood vessel occlusion with resultant organ, and/or nervous system infarction; and/or aseptic necrosis of one or more joints. Finally, the patient was informed that Medicine is not an exact science; therefore, there is also the possibility of unforeseen or unpredictable risks and/or possible complications that may result in a catastrophic outcome. The patient indicated having understood very clearly. We have given the patient no guarantees and we have made no promises. Enough time was given to the patient to ask questions, all of which were answered to the patient's satisfaction. Jade Harris has indicated that she wanted to continue with the procedure. Attestation: I, the ordering provider, attest that I have discussed with the patient the benefits, risks, side-effects, alternatives, likelihood of achieving goals, and potential problems during recovery for the procedure that I have provided informed consent. Date  Time: 03/01/2020   9:35 AM  Pre-Procedure Preparation:  Monitoring: As per clinic protocol. Respiration, ETCO2, SpO2, BP, heart rate and rhythm monitor placed and checked for adequate function Safety Precautions: Patient was assessed for positional comfort and pressure points before starting the procedure. Time-out: I initiated and conducted the "Time-out" before starting the procedure, as per protocol. The patient was asked to participate by confirming the accuracy of the "Time Out" information. Verification of the correct person, site, and procedure were performed and confirmed by me, the nursing staff, and the patient. "Time-out" conducted as per Joint Commission's Universal Protocol (UP.01.01.01). Time: [id number]  Description of Procedure:          Target Area: Inferior, posterior, aspect of the sacroiliac fissure Approach: Posterior, paraspinal, ipsilateral approach. Area Prepped: Entire Lower Lumbosacral Region DuraPrep (Iodine Povacrylex [0.7% available iodine] and Isopropyl Alcohol, 74% w/w) Safety Precautions: Aspiration looking for blood return was conducted prior to all injections. At no point did we inject any substances, as a needle was being advanced. No attempts were made at seeking any paresthesias. Safe injection practices and needle disposal techniques used. Medications properly checked for expiration dates. SDV (single dose vial) medications used. Description of the Procedure: Protocol guidelines were followed. The patient was placed in position over the procedure table. The target area was identified and the area prepped in the usual manner. Skin & deeper tissues infiltrated with local anesthetic. Appropriate amount of time allowed to pass for local anesthetics to take effect. The procedure needle was advanced under fluoroscopic guidance into the sacroiliac joint until a firm endpoint was obtained. Proper needle placement secured. Negative aspiration confirmed. Solution injected in intermittent fashion,  asking for systemic symptoms every 0.5cc of injectate. The needles were then removed and the area cleansed, making sure to leave some of the prepping solution back to take advantage of its long term bactericidal properties. Vitals:   03/01/20 0956 03/01/20 1000 03/01/20 1004 03/01/20 1010  BP: (!) 154/102 (!) 160/98 (!) 152/99 (!) 158/93  Pulse:      Resp: 17 18 16    Temp:      SpO2: 97% 96% 97%   Weight:      Height:        Start Time: 0959 hrs. End Time: 1004 hrs. Materials:  Needle(s) Type: Spinal Needle Gauge: 22G Length: 5-in Medication(s): Please see orders for medications and dosing details.  Imaging Guidance (Non-Spinal):          Type of Imaging Technique: Fluoroscopy Guidance (Non-Spinal) Indication(s): Assistance in needle guidance and placement for procedures requiring needle placement in or near specific anatomical locations not easily accessible without such assistance. Exposure Time: Please see nurses notes. Contrast: None used. Fluoroscopic Guidance: I was personally present during the use of fluoroscopy. "Tunnel Vision Technique" used to obtain the best possible view of the target area. Parallax error corrected before commencing the procedure. "Direction-depth-direction" technique used to introduce the needle under continuous pulsed fluoroscopy. Once target was reached, antero-posterior, oblique, and lateral fluoroscopic projection used confirm needle placement in all planes. Images permanently stored in EMR.    Interpretation: No contrast injected.  Antibiotic Prophylaxis:   Anti-infectives (From admission, onward)   None     Indication(s): None identified  Post-operative Assessment:  Post-procedure Vital Signs:  Pulse/HCG Rate: 7366 Temp: (!) 97.2 F (36.2 C) Resp: 16 BP: (!) 158/93 SpO2: 97 %  EBL: None  Complications: No immediate post-treatment complications observed by team, or reported by patient.  Note: The patient tolerated the entire  procedure well. A repeat set of vitals were taken after the procedure and the patient was kept under observation following institutional policy, for this type of procedure. Post-procedural neurological assessment was performed, showing return to baseline, prior to discharge. The patient was provided with post-procedure discharge instructions, including a section on how to identify potential problems. Should any problems arise concerning this procedure, the patient was given instructions to immediately contact us, at any time, without hesitation. In any case, we plan to contact the patient by telephone for a follow-up status report regarding this interventional procedure.  Comments:  No additional relevant information.  Plan of Care  Orders:  Orders Placed This Encounter  Procedures  . SACROILIAC JOINT INJECTION    Scheduling Instructions:     Side: Right-sided     Sedation: With Sedation.     Timeframe: Today    Order Specific Question:   Where will this procedure be performed?    Answer:   [facility]  . DG [facility] C-ARM 1-60 MIN NO REPORT    Intraoperative interpretation by procedural physician at [place].    Standing Status:   Standing    Number of Occurrences:   1    Order Specific Question:   Reason for exam:    Answer:   Assistance in needle guidance and placement for procedures requiring needle placement in or near specific anatomical locations not easily accessible without such assistance.  . Informed Consent Details: Physician/Practitioner Attestation; Transcribe to consent form and obtain patient signature    Nursing Order: Transcribe to consent form and obtain patient signature. Note: Always confirm laterality of pain with Ms. Winiarski, before procedure.    Order Specific Question:   Physician/Practitioner attestation of informed consent for procedure/surgical case    Answer:   I, the physician/practitioner, attest that I have discussed with the patient  the benefits, risks, side effects, alternatives, likelihood of achieving goals and potential problems during recovery for the procedure that I have  provided informed consent.    Order Specific Question:   Procedure    Answer:   Sacroiliac Joint Block    Order Specific Question:   Physician/Practitioner performing the procedure    Answer:   Maeryn Mcgath A. Dossie Arbour, MD    Order Specific Question:   Indication/Reason    Answer:   Chronic Low Back and Hip Pain secondary to Sacroiliac Joint Pain (Arthralgia/Arthropathy)  . Provide equipment / supplies at bedside    "Block Tray" (Disposable  single use) Needle type: SpinalSpinal Amount/quantity: 1 Size: Medium (5-inch) Gauge: 22G    Standing Status:   Standing    Number of Occurrences:   1    Order Specific Question:   Specify    Answer:   Block Tray   Chronic Opioid Analgesic:  None MME/day: 0 mg/day   Medications ordered for procedure: Meds ordered this encounter  Medications  . lidocaine (XYLOCAINE) 2 % (with pres) injection 400 mg  . DISCONTD: lactated ringers infusion 1,000 mL  . DISCONTD: midazolam (VERSED) 5 MG/5ML injection 1-2 mg    Make sure Flumazenil is available in the pyxis when using this medication. If oversedation occurs, administer 0.2 mg IV over 15 sec. If after 45 sec no response, administer 0.2 mg again over 1 min; may repeat at 1 min intervals; not to exceed 4 doses (1 mg)  . DISCONTD: fentaNYL (SUBLIMAZE) injection 25-50 mcg    Make sure Narcan is available in the pyxis when using this medication. In the event of respiratory depression (RR< 8/min): Titrate NARCAN (naloxone) in increments of 0.1 to 0.2 mg IV at 2-3 minute intervals, until desired degree of reversal.  . methylPREDNISolone acetate (DEPO-MEDROL) injection 80 mg  . ropivacaine (PF) 2 mg/mL (0.2%) (NAROPIN) injection 4 mL   Medications administered: We administered lidocaine, methylPREDNISolone acetate, and ropivacaine (PF) 2 mg/mL (0.2%).  See the  medical record for exact dosing, route, and time of administration.  Follow-up plan:   Return in about 2 weeks (around 03/15/2020) for (F2F), (PP) Follow-up.      Interventional Therapies  Risk  Complexity Considerations:   WNL   Planned  Pending:      Under consideration:   Diagnostic bilateral lumbar facet medial branch block #2  Possible bilateral lumbar facet RFA  Diagnostic right SI joint block #1  Possible right SI joint RFA  Diagnostic bilateral cervical facet medial branch block #1  Possible bilateral cervical facet RFA  Diagnostic right caudal ESI + epidurogram    Completed:   Diagnostic bilateral lumbar facet MBB x1 (02/09/2020) (+S:50/50/50 x2 weeks) Diagnostic right (inferior) SI joint block x1 (03/01/2020)    Therapeutic  Palliative (PRN) options:   None established    Recent Visits Date Type Provider Dept  02/24/20 Office Visit Milinda Pointer, MD [facility]  02/09/20 Procedure visit Milinda Pointer, MD [facility]  02/03/20 Office Visit Milinda Pointer, MD [facility]  01/25/20 Office Visit Milinda Pointer, MD [facility]  Showing recent visits within past 90 days and meeting all other requirements Today's Visits Date Type Provider Dept  03/01/20 Procedure visit Milinda Pointer, MD [facility]  Showing today's visits and meeting all other requirements Future Appointments Date Type Provider Dept  03/16/20 Appointment Milinda Pointer, MD [facility]  Showing future appointments within next 90 days and meeting all other requirements  Disposition: Discharge home  Discharge (Date  Time): 03/01/2020; 1015 hrs.   Primary Care Physician: Glean Hess, MD Location: [facility]  Outpatient Pain Management Facility Note by: Gaspar Cola, MD Date: 03/01/2020; Time: 10:16 AM  Disclaimer:  Medicine is not an Chief Strategy Officer. The only guarantee in medicine is that nothing is guaranteed.  It is important to note that the decision to proceed with this intervention was based on the information collected from the patient. The Data and conclusions were drawn from the patient's questionnaire, the interview, and the physical examination. Because the information was provided in large part by the patient, it cannot be guaranteed that it has not been purposely or unconsciously manipulated. Every effort has been made to obtain as much relevant data as possible for this evaluation. It is important to note that the conclusions that lead to this procedure are derived in large part from the available data. Always take into account that the treatment will also be dependent on availability of resources and existing treatment guidelines, considered by other Pain Management Practitioners as being common knowledge and practice, at the time of the intervention. For Medico-Legal purposes, it is also important to point out that variation in procedural techniques and pharmacological choices are the acceptable norm. The indications, contraindications, technique, and results of the above procedure should only be interpreted and judged by a Board-Certified Interventional Pain Specialist with extensive familiarity and expertise in the same exact procedure and technique.

## 2020-03-01 NOTE — Patient Instructions (Signed)

## 2020-03-01 NOTE — Progress Notes (Signed)
Safety precautions to be maintained throughout the outpatient stay will include: orient to surroundings, keep bed in low position, maintain call bell within reach at all times, provide assistance with transfer out of bed and ambulation.  

## 2020-03-02 ENCOUNTER — Telehealth: Payer: Self-pay

## 2020-03-02 NOTE — Telephone Encounter (Signed)
Post procedure follow up.  Patient states she is doing well.   ?

## 2020-03-03 ENCOUNTER — Ambulatory Visit: Payer: Self-pay | Admitting: Internal Medicine

## 2020-03-03 NOTE — Progress Notes (Deleted)
Date:  03/03/2020   Name:  Jade Harris   DOB:  1965/12/24   MRN:  [id number]   Chief Complaint: No chief complaint on file.  Hypertension This is a recurrent (higher readings on the last few OV with pain management) problem.    Lab Results  Component Value Date   CREATININE 0.67 01/25/2020   BUN 14 01/25/2020   NA 141 01/25/2020   K 4.3 01/25/2020   CL 104 01/25/2020   CO2 23 11/07/2018   Lab Results  Component Value Date   CHOL 247 (H) 11/07/2018   HDL 86 11/07/2018   LDLCALC 129 (H) 11/07/2018   TRIG 183 (H) 11/07/2018   CHOLHDL 2.9 11/07/2018   Lab Results  Component Value Date   TSH 1.380 11/07/2018   No results found for: HGBA1C Lab Results  Component Value Date   WBC 6.8 11/07/2018   HGB 14.2 11/07/2018   HCT 41.4 11/07/2018   MCV 90 11/07/2018   PLT 277 11/07/2018   Lab Results  Component Value Date   ALT 12 11/07/2018   AST 16 01/25/2020   ALKPHOS 64 01/25/2020   BILITOT 0.3 01/25/2020     Review of Systems  Patient Active Problem List   Diagnosis Date Noted  . Chronic sacroiliac joint pain (Right) 02/24/2020  . Other spondylosis, sacral and sacrococcygeal region 02/24/2020  . Enthesopathy of sacroiliac joint (Right) 02/24/2020  . Vitamin D insufficiency 02/03/2020  . Lumbar facet hypertrophy 02/03/2020  . Cervical facet hypertrophy (Multilevel) (Bilateral) 02/03/2020  . 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 History of illicit drug use 83/15/1761  . History of marijuana use 02/03/2020  . Spondylosis without myelopathy or radiculopathy, lumbosacral region 02/03/2020  . Abnormal drug screen (01/25/2020) 02/03/2020  . Chronic low back pain (1ry area of Pain) (Bilateral) (R>L) w/o sciatica 01/25/2020  . Lumbosacral facet joint syndrome (Bilateral) (R>L) 01/25/2020  . Chronic musculoskeletal pain 01/25/2020  . Chronic lower extremity pain (2ry area of Pain) (Right) 01/25/2020  . Chronic groin pain  (Intermittent) (Right) 01/25/2020  . Lumbosacral radiculopathy/radiculitis at L2 (Right) 01/25/2020  . Chronic neck pain (Bilateral) (L>R) 01/25/2020  . Cervicalgia 01/25/2020  . Cervicogenic headache (Left) 01/25/2020  . Cervico-occipital neuralgia (Left) 01/25/2020  . Radiculitis involving upper extremity (Left) 01/25/2020  . Numbness and tingling of upper extremity (Left) 01/25/2020  . DDD (degenerative disc disease), cervical 01/25/2020  . DDD (degenerative disc disease), lumbosacral 01/25/2020  . Discogenic low back pain 01/25/2020  . Chronic pain syndrome 01/24/2020  . Pharmacologic therapy 01/24/2020  . Disorder of skeletal system 01/24/2020  . Problems influencing health status 01/24/2020  . Slow transit constipation 11/25/2018  . Venous insufficiency of both lower extremities 11/25/2018  . Eczema 08/23/2017  . Headache, chronic daily 09/20/2015  . Dyslipidemia 07/04/2014  . Depression, major, single episode, in partial remission ([place]) 07/04/2014  . Menopause 07/04/2014  . 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  . Idiopathic insomnia 07/04/2014    Allergies  Allergen Reactions  . Sulfa Antibiotics Other (See Comments)    Past Surgical History:  Procedure Laterality Date  . ABDOMINAL HYSTERECTOMY    . APPENDECTOMY    . MOUTH SURGERY    . NASAL SINUS SURGERY  04/2014  . TONSILLECTOMY    . TOTAL ABDOMINAL HYSTERECTOMY W/ BILATERAL SALPINGOOPHORECTOMY     wtih oophorectomy    Social History   Tobacco Use  . Smoking status: Former Smoker    Quit date: 1989  Years since quitting: 33.1  . Smokeless tobacco: Never Used  Vaping Use  . Vaping Use: Never used  Substance Use Topics  . Alcohol use: Yes    Alcohol/week: 0.0 standard drinks    Comment: occasional  . Drug use: No     Medication list has been reviewed and updated.  No outpatient medications have been marked as taking for the 03/03/20 encounter (Appointment) with Glean Hess, MD.     [facility] 2/9 Scores 01/25/2020 12/11/2019 11/30/2019 11/13/2019  PHQ - 2 Score 0 0 0 2  PHQ- 9 Score - 1 1 8     GAD 7 : Generalized Anxiety Score 12/11/2019 11/30/2019 11/13/2019 08/19/2019  Nervous, Anxious, on Edge 0 0 0 1  Control/stop worrying 0 0 0 0  Worry too much - different things 0 0 0 0  Trouble relaxing 0 0 0 1  Restless 0 0 0 1  Easily annoyed or irritable 0 0 0 1  Afraid - awful might happen 0 0 0 0  Total GAD 7 Score 0 0 0 4  Anxiety Difficulty - Not difficult at all Not difficult at all Not difficult at all    BP Readings from Last 3 Encounters:  03/01/20 (!) 158/93  02/24/20 (!) 149/91  02/09/20 132/89    Physical Exam  Wt Readings from Last 3 Encounters:  03/01/20 200 lb (90.7 kg)  02/24/20 200 lb (90.7 kg)  02/09/20 200 lb (90.7 kg)    There were no vitals taken for this visit.  Assessment and Plan:

## 2020-03-06 ENCOUNTER — Other Ambulatory Visit: Payer: Self-pay | Admitting: Internal Medicine

## 2020-03-15 NOTE — Progress Notes (Signed)
PROVIDER NOTE: Information contained herein reflects review and annotations entered in association with encounter. Interpretation of such information and data should be left to medically-trained personnel. Information provided to patient can be located elsewhere in the medical record under "Patient Instructions". Document created using STT-dictation technology, any transcriptional errors that may result from process are unintentional.    Patient: Jade Harris  Service Category: E/M  Provider: Gaspar Cola, MD  DOB: 10/01/1965  DOS: 03/16/2020  Specialty: Interventional Pain Management  MRN: [id number]  Setting: Ambulatory outpatient  PCP: Glean Hess, MD  Type: Established Patient    Referring Provider: Glean Hess, MD  Location: Office  Delivery: Face-to-face     HPI  [name], a 55 y.o. year old female, is here today because of her Chronic pain syndrome [G89.4]. [name]'s primary complain today is Back Pain Last encounter: My last encounter with her was on 03/01/2020. Pertinent problems: [name] has Degenerative arthritis of lumbar spine; Headache, chronic daily; Venous insufficiency of both lower extremities; Chronic pain syndrome; Chronic low back pain (1ry area of Pain) (Bilateral) (R>L) w/o sciatica; Lumbosacral facet joint syndrome (Bilateral) (R>L); Chronic musculoskeletal pain; Chronic lower extremity pain (2ry area of Pain) (Right); Chronic groin pain (Intermittent) (Right); Lumbosacral radiculopathy/radiculitis at L2 (Right); Chronic neck pain (Bilateral) (L>R); Cervicalgia; Cervicogenic headache (Left); Cervico-occipital neuralgia (Left); Radiculitis involving upper extremity (Left); Numbness and tingling of upper extremity (Left); DDD (degenerative disc disease), cervical; DDD (degenerative disc disease), lumbosacral; Discogenic low back pain; Lumbar facet hypertrophy; Cervical facet hypertrophy (Multilevel) (Bilateral); 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 Pain Assessment: Severity of Chronic pain is reported as a 1 /10. Location: Back Lower/Denies. Onset: More than a month ago. Quality: Discomfort,Aching. Timing: Intermittent. Modifying factor(s): procedures and meds. Vitals:  height is '5\' 5"'  (1.651 m) and weight is 200 lb (90.7 kg). Her temperature is 97.2 F (36.2 C) (abnormal). Her blood pressure is 143/97 (abnormal) and her pulse is 70. Her oxygen saturation is 100%.   Reason for encounter: post-procedure assessment.  The patient indicates having attained 100% relief of the pain that lasted until yesterday when the pain started coming back.  Today we talked about the long-term plan regarding her right SI joint pain.  I explained to her that we will be planning on doing a second diagnostic injection and if the second 1 proves to be as effective at this 1, then we will consider radiofrequency ablation.  I explained to her what the radiofrequency ablation consisted of an she understood and accepted.  Post-Procedure Evaluation  Procedure (03/01/2020): Diagnostic right SI joint block #1 under fluoroscopic guidance, no sedation Pre-procedure pain level: 3/10 Post-procedure: 1/10 Limited initial benefit, possibly due to rapid discharge after no sedation procedure, without enough time to allow full onset of block.  Sedation: None.  Effectiveness during initial hour after procedure(Ultra-Short Term Relief): 100 %.  Local anesthetic used: Long-acting (4-6 hours) Effectiveness: Defined as any analgesic benefit obtained secondary to the administration of local anesthetics. This carries significant diagnostic value as to the etiological location, or anatomical origin, of the pain. Duration of  benefit is expected to coincide with the duration of the local anesthetic used.  Effectiveness during initial 4-6 hours after procedure(Short-Term Relief): 100 %.  Long-term benefit: Defined as any relief past the pharmacologic duration  of the local anesthetics.  Effectiveness past the initial 6 hours after procedure(Long-Term Relief): 100 % (lasted for 14 days).  Current benefits: Defined as benefit that persist at this time.   Analgesia:  The patient indicates currently enjoying an 85% improvement in her low back pain.  However, some of that pain is beginning to come back. Function: [name] reports improvement in function ROM: [name] reports improvement in ROM  Pharmacotherapy Assessment   Analgesic: None MME/day: 0 mg/day   Monitoring: [place] PMP: PDMP reviewed during this encounter.       Pharmacotherapy: No side-effects or adverse reactions reported. Compliance: No problems identified. Effectiveness: Clinically acceptable.  No notes on file  UDS:  Summary  Date Value Ref Range Status  01/25/2020 Note  Final    Comment:    ==================================================================== Compliance Drug Analysis, Ur ==================================================================== Test                             Result       Flag       Units  Drug Present and Declared for Prescription Verification   Duloxetine                     PRESENT      EXPECTED  Drug Present not Declared for Prescription Verification   Carboxy-THC                    272          UNEXPECTED ng/mg creat    Carboxy-THC is a metabolite of tetrahydrocannabinol (THC). Source of    THC is most commonly herbal marijuana or marijuana-based products,    but THC is also present in a scheduled prescription medication.    Trace amounts of THC can be present in hemp and cannabidiol (CBD)    products. This test is not intended to distinguish between delta-9-    tetrahydrocannabinol, the predominant  form of THC in most herbal or    marijuana-based products, and delta-8-tetrahydrocannabinol.    Methocarbamol                  PRESENT      UNEXPECTED   Guaifenesin                    PRESENT      UNEXPECTED    Guaifenesin may be administered as an over-the-counter or    prescription drug; it may also be present as a breakdown product of    methocarbamol.    Acetaminophen                  PRESENT      UNEXPECTED   Doxylamine                     PRESENT      UNEXPECTED   Dextromethorphan               PRESENT      UNEXPECTED   Dextrorphan/Levorphanol        PRESENT      UNEXPECTED    Dextrorphan is an expected metabolite of dextromethorphan, an over-    the-counter or prescription cough suppressant. Dextrorphan cannot be    distinguished from the scheduled prescription medication levorphanol    by the method used for analysis.  Drug Absent but Declared for Prescription Verification   Ibuprofen  Not Detected UNEXPECTED    Ibuprofen, as indicated in the declared medication list, is not    always detected even when used as directed.  ==================================================================== Test                      Result    Flag   Units      Ref Range   Creatinine              149              mg/dL      >=20 ==================================================================== Declared Medications:  The flagging and interpretation on this report are based on the  following declared medications.  Unexpected results may arise from  inaccuracies in the declared medications.   **Note: The testing scope of this panel includes these medications:   Duloxetine   **Note: The testing scope of this panel does not include small to  moderate amounts of these reported medications:   Ibuprofen   **Note: The testing scope of this panel does not include the  following reported medications:   Cholecalciferol  Estradiol  Fluticasone  Hydrochlorothiazide   Multivitamin  Topical  Triamterene  Vitamin B ==================================================================== For clinical consultation, please call [phone]. ====================================================================      ROS  Constitutional: Denies any fever or chills Gastrointestinal: No reported hemesis, hematochezia, vomiting, or acute GI distress Musculoskeletal: Denies any acute onset joint swelling, redness, loss of ROM, or weakness Neurological: No reported episodes of acute onset apraxia, aphasia, dysarthria, agnosia, amnesia, paralysis, loss of coordination, or loss of consciousness  Medication Review  B Complex Vitamins, Calcipotriene-Betameth Diprop, DULoxetine, Multiple Vitamins-Minerals, acetaminophen, ergocalciferol, estradiol, fluticasone, ibuprofen, and triamterene-hydrochlorothiazide  History Review  Allergy: Ms. Hagstrom is allergic to sulfa antibiotics. Drug: Ms. Wittmann  reports no history of drug use. Alcohol:  reports current alcohol use. Tobacco:  reports that she quit smoking about 33 years ago. She has never used smokeless tobacco. Social: Ms. Islam  reports that she quit smoking about 33 years ago. She has never used smokeless tobacco. She reports current alcohol use. She reports that she does not use drugs. Medical:  has a past medical history of Cancer ([place]). Surgical: Ms. Ingham  has a past surgical history that includes Nasal sinus surgery (04/2014); Tonsillectomy; Appendectomy; Total abdominal hysterectomy w/ bilateral salpingoophorectomy; Mouth surgery; and Abdominal hysterectomy. Family: family history includes Hypertension in her mother.  Laboratory Chemistry Profile   Renal Lab Results  Component Value Date   BUN 14 01/25/2020   CREATININE 0.67 01/25/2020   BCR 21 01/25/2020   GFRAA 114 01/25/2020   GFRNONAA 99 01/25/2020     Hepatic Lab Results  Component Value Date   AST 16 01/25/2020   ALT 12 11/07/2018    ALBUMIN 4.4 01/25/2020   ALKPHOS 64 01/25/2020     Electrolytes Lab Results  Component Value Date   NA 141 01/25/2020   K 4.3 01/25/2020   CL 104 01/25/2020   CALCIUM 9.5 01/25/2020   MG 2.0 01/25/2020     Bone Lab Results  Component Value Date   25OHVITD1 29 (L) 01/25/2020   25OHVITD2 <1.0 01/25/2020   25OHVITD3 29 01/25/2020     Inflammation (CRP: Acute Phase) (ESR: Chronic Phase) Lab Results  Component Value Date   CRP 3 01/25/2020   ESRSEDRATE 21 01/25/2020       Note: Above Lab results reviewed.  Recent Imaging Review  DG PAIN [facility] C-ARM 1-60 MIN NO REPORT Fluoro  was used, but no Radiologist interpretation will be provided.  Please refer to "NOTES" tab for provider progress note. Note: Reviewed        Physical Exam  General appearance: Well nourished, well developed, and well hydrated. In no apparent acute distress Mental status: Alert, oriented x 3 (person, place, & time)       Respiratory: No evidence of acute respiratory distress Eyes: PERLA Vitals: BP (!) 143/97   Pulse 70   Temp (!) 97.2 F (36.2 C)   Ht '5\' 5"'  (1.651 m)   Wt 200 lb (90.7 kg)   SpO2 100%   BMI 33.28 kg/m  BMI: Estimated body mass index is 33.28 kg/m as calculated from the following:   Height as of this encounter: '5\' 5"'  (1.651 m).   Weight as of this encounter: 200 lb (90.7 kg). Ideal: Ideal body weight: 57 kg (125 lb 10.6 oz) Adjusted ideal body weight: 70.5 kg (155 lb 6.4 oz)  Assessment   Status Diagnosis  Controlled Controlled Controlled 1. Chronic pain syndrome   2. Chronic sacroiliac joint pain (Right)   3. Enthesopathy of sacroiliac joint (Right)   4. Chronic low back pain (1ry area of Pain) (Bilateral) (R>L) w/o sciatica   5. Lumbosacral facet joint syndrome (Bilateral) (R>L)   6. Chronic lower extremity pain (2ry area of Pain) (Right)   7. Chronic neck pain (Bilateral) (L>R)   8. Pharmacologic therapy      Updated Problems: No problems updated.  Plan of  Care  Problem-specific:  No problem-specific Assessment & Plan notes found for this encounter.  Ms. NAKIESHA RUMSEY has a current medication list which includes the following long-term medication(s): duloxetine, ergocalciferol, estradiol, and fluticasone.  Pharmacotherapy (Medications Ordered): No orders of the defined types were placed in this encounter.  Orders:  Orders Placed This Encounter  Procedures  . SACROILIAC JOINT INJECTION    Standing Status:   Future    Standing Expiration Date:   04/13/2020    Scheduling Instructions:     Side: Right-sided     Sedation: Patient's choice.     Timeframe: ASAP    Order Specific Question:   Where will this procedure be performed?    Answer:   [facility]   Follow-up plan:   Return for Procedure (no sedation): (R) SI BLK #2.      Interventional Therapies  Risk  Complexity Considerations:   WNL   Planned  Pending:      Under consideration:   Diagnostic bilateral lumbar facet medial branch block #2  Possible bilateral lumbar facet RFA  Diagnostic right SI joint block #1  Possible right SI joint RFA  Diagnostic bilateral cervical facet medial branch block #1  Possible bilateral cervical facet RFA  Diagnostic right caudal ESI + epidurogram    Completed:   Diagnostic bilateral lumbar facet MBB x1 (02/09/2020) (+S:50/50/50 x2 weeks) Diagnostic right (inferior) SI joint block x1 (03/01/2020)    Therapeutic  Palliative (PRN) options:   None established     Recent Visits Date Type Provider Dept  03/01/20 Procedure visit Milinda Pointer, MD [facility]  02/24/20 Office Visit Milinda Pointer, MD [facility]  02/09/20 Procedure visit Milinda Pointer, MD [facility]  02/03/20 Office Visit Milinda Pointer, MD [facility]  01/25/20 Office Visit Milinda Pointer, MD [facility]  Showing recent visits within past 90 days and meeting all other  requirements Today's Visits Date Type Provider Dept  03/16/20 Office Visit Dossie Arbour,  Beatriz Chancellor, MD [facility]  Showing today's visits and meeting all other requirements Future Appointments Date Type Provider Dept  03/22/20 Appointment Milinda Pointer, MD [facility]  Showing future appointments within next 90 days and meeting all other requirements  I discussed the assessment and treatment plan with the patient. The patient was provided an opportunity to ask questions and all were answered. The patient agreed with the plan and demonstrated an understanding of the instructions.  Patient advised to call back or seek an in-person evaluation if the symptoms or condition worsens.  Duration of encounter: 30 minutes.  Note by: Gaspar Cola, MD Date: 03/16/2020; Time: 3:25 PM

## 2020-03-16 ENCOUNTER — Other Ambulatory Visit: Payer: Self-pay

## 2020-03-16 ENCOUNTER — Encounter: Payer: Self-pay | Admitting: Pain Medicine

## 2020-03-16 ENCOUNTER — Ambulatory Visit: Payer: BC Managed Care – PPO | Attending: Pain Medicine | Admitting: Pain Medicine

## 2020-03-16 VITALS — BP 143/97 | HR 70 | Temp 97.2°F | Ht 65.0 in | Wt 200.0 lb

## 2020-03-16 DIAGNOSIS — Z79899 Other long term (current) drug therapy: Secondary | ICD-10-CM

## 2020-03-16 DIAGNOSIS — M533 Sacrococcygeal disorders, not elsewhere classified: Secondary | ICD-10-CM

## 2020-03-16 DIAGNOSIS — G8929 Other chronic pain: Secondary | ICD-10-CM | POA: Diagnosis present

## 2020-03-16 DIAGNOSIS — M79604 Pain in right leg: Secondary | ICD-10-CM

## 2020-03-16 DIAGNOSIS — M47817 Spondylosis without myelopathy or radiculopathy, lumbosacral region: Secondary | ICD-10-CM

## 2020-03-16 DIAGNOSIS — M542 Cervicalgia: Secondary | ICD-10-CM | POA: Diagnosis present

## 2020-03-16 DIAGNOSIS — G894 Chronic pain syndrome: Secondary | ICD-10-CM

## 2020-03-16 DIAGNOSIS — M779 Enthesopathy, unspecified: Secondary | ICD-10-CM

## 2020-03-16 DIAGNOSIS — M545 Low back pain, unspecified: Secondary | ICD-10-CM

## 2020-03-16 NOTE — Patient Instructions (Signed)
____________________________________________________________________________________________  Preparing for Procedure with Sedation  Procedure appointments are limited to planned procedures: . No Prescription Refills. . No disability issues will be discussed. . No medication changes will be discussed.  Instructions: . Oral Intake: Do not eat or drink anything for at least 8 hours prior to your procedure. (Exception: Blood Pressure Medication. See below.) . Transportation: Unless otherwise stated by your physician, you may drive yourself after the procedure. . Blood Pressure Medicine: Do not forget to take your blood pressure medicine with a sip of water the morning of the procedure. If your Diastolic (lower reading)is above 100 mmHg, elective cases will be cancelled/rescheduled. . Blood thinners: These will need to be stopped for procedures. Notify our staff if you are taking any blood thinners. Depending on which one you take, there will be specific instructions on how and when to stop it. . Diabetics on insulin: Notify the staff so that you can be scheduled 1st case in the morning. If your diabetes requires high dose insulin, take only  of your normal insulin dose the morning of the procedure and notify the staff that you have done so. . Preventing infections: Shower with an antibacterial soap the morning of your procedure. . Build-up your immune system: Take 1000 mg of Vitamin C with every meal (3 times a day) the day prior to your procedure. . Antibiotics: Inform the staff if you have a condition or reason that requires you to take antibiotics before dental procedures. . Pregnancy: If you are pregnant, call and cancel the procedure. . Sickness: If you have a cold, fever, or any active infections, call and cancel the procedure. . Arrival: You must be in the facility at least 30 minutes prior to your scheduled procedure. . Children: Do not bring children with you. . Dress appropriately:  Bring dark clothing that you would not mind if they get stained. . Valuables: Do not bring any jewelry or valuables.  Reasons to call and reschedule or cancel your procedure: (Following these recommendations will minimize the risk of a serious complication.) . Surgeries: Avoid having procedures within 2 weeks of any surgery. (Avoid for 2 weeks before or after any surgery). . Flu Shots: Avoid having procedures within 2 weeks of a flu shots or . (Avoid for 2 weeks before or after immunizations). . Barium: Avoid having a procedure within 7-10 days after having had a radiological study involving the use of radiological contrast. (Myelograms, Barium swallow or enema study). . Heart attacks: Avoid any elective procedures or surgeries for the initial 6 months after a "Myocardial Infarction" (Heart Attack). . Blood thinners: It is imperative that you stop these medications before procedures. Let us know if you if you take any blood thinner.  . Infection: Avoid procedures during or within two weeks of an infection (including chest colds or gastrointestinal problems). Symptoms associated with infections include: Localized redness, fever, chills, night sweats or profuse sweating, burning sensation when voiding, cough, congestion, stuffiness, runny nose, sore throat, diarrhea, nausea, vomiting, cold or Flu symptoms, recent or current infections. It is specially important if the infection is over the area that we intend to treat. . Heart and lung problems: Symptoms that may suggest an active cardiopulmonary problem include: cough, chest pain, breathing difficulties or shortness of breath, dizziness, ankle swelling, uncontrolled high or unusually low blood pressure, and/or palpitations. If you are experiencing any of these symptoms, cancel your procedure and contact your primary care physician for an evaluation.  Remember:  Regular Business hours are:    Monday to Thursday 8:00 AM to 4:00 PM  Provider's  Schedule: Kendricks Reap, MD:  Procedure days: Tuesday and Thursday 7:30 AM to 4:00 PM  Bilal Lateef, MD:  Procedure days: Monday and Wednesday 7:30 AM to 4:00 PM ____________________________________________________________________________________________   ____________________________________________________________________________________________  General Risks and Possible Complications  Patient Responsibilities: It is important that you read this as it is part of your informed consent. It is our duty to inform you of the risks and possible complications associated with treatments offered to you. It is your responsibility as a patient to read this and to ask questions about anything that is not clear or that you believe was not covered in this document.  Patient's Rights: You have the right to refuse treatment. You also have the right to change your mind, even after initially having agreed to have the treatment done. However, under this last option, if you wait until the last second to change your mind, you may be charged for the materials used up to that point.  Introduction: Medicine is not an exact science. Everything in Medicine, including the lack of treatment(s), carries the potential for danger, harm, or loss (which is by definition: Risk). In Medicine, a complication is a secondary problem, condition, or disease that can aggravate an already existing one. All treatments carry the risk of possible complications. The fact that a side effects or complications occurs, does not imply that the treatment was conducted incorrectly. It must be clearly understood that these can happen even when everything is done following the highest safety standards.  No treatment: You can choose not to proceed with the proposed treatment alternative. The "PRO(s)" would include: avoiding the risk of complications associated with the therapy. The "CON(s)" would include: not getting any of the treatment  benefits. These benefits fall under one of three categories: diagnostic; therapeutic; and/or palliative. Diagnostic benefits include: getting information which can ultimately lead to improvement of the disease or symptom(s). Therapeutic benefits are those associated with the successful treatment of the disease. Finally, palliative benefits are those related to the decrease of the primary symptoms, without necessarily curing the condition (example: decreasing the pain from a flare-up of a chronic condition, such as incurable terminal cancer).  General Risks and Complications: These are associated to most interventional treatments. They can occur alone, or in combination. They fall under one of the following six (6) categories: no benefit or worsening of symptoms; bleeding; infection; nerve damage; allergic reactions; and/or death. 1. No benefits or worsening of symptoms: In Medicine there are no guarantees, only probabilities. No healthcare provider can ever guarantee that a medical treatment will work, they can only state the probability that it may. Furthermore, there is always the possibility that the condition may worsen, either directly, or indirectly, as a consequence of the treatment. 2. Bleeding: This is more common if the patient is taking a blood thinner, either prescription or over the counter (example: Goody Powders, Fish oil, Aspirin, Garlic, etc.), or if suffering a condition associated with impaired coagulation (example: Hemophilia, cirrhosis of the liver, low platelet counts, etc.). However, even if you do not have one on these, it can still happen. If you have any of these conditions, or take one of these drugs, make sure to notify your treating physician. 3. Infection: This is more common in patients with a compromised immune system, either due to disease (example: diabetes, cancer, human immunodeficiency virus [HIV], etc.), or due to medications or treatments (example: therapies used to treat  cancer and   rheumatological diseases). However, even if you do not have one on these, it can still happen. If you have any of these conditions, or take one of these drugs, make sure to notify your treating physician. 4. Nerve Damage: This is more common when the treatment is an invasive one, but it can also happen with the use of medications, such as those used in the treatment of cancer. The damage can occur to small secondary nerves, or to large primary ones, such as those in the spinal cord and brain. This damage may be temporary or permanent and it may lead to impairments that can range from temporary numbness to permanent paralysis and/or brain death. 5. Allergic Reactions: Any time a substance or material comes in contact with our body, there is the possibility of an allergic reaction. These can range from a mild skin rash (contact dermatitis) to a severe systemic reaction (anaphylactic reaction), which can result in death. 6. Death: In general, any medical intervention can result in death, most of the time due to an unforeseen complication. ____________________________________________________________________________________________   

## 2020-03-22 ENCOUNTER — Ambulatory Visit (HOSPITAL_BASED_OUTPATIENT_CLINIC_OR_DEPARTMENT_OTHER): Payer: BC Managed Care – PPO | Admitting: Pain Medicine

## 2020-03-22 ENCOUNTER — Other Ambulatory Visit: Payer: Self-pay

## 2020-03-22 ENCOUNTER — Encounter: Payer: Self-pay | Admitting: Pain Medicine

## 2020-03-22 ENCOUNTER — Ambulatory Visit
Admission: RE | Admit: 2020-03-22 | Discharge: 2020-03-22 | Disposition: A | Discharge status: Home / Self Care | Payer: BC Managed Care – PPO | Source: Ambulatory Visit | Attending: Pain Medicine | Admitting: Pain Medicine

## 2020-03-22 VITALS — BP 137/82 | HR 80 | Temp 97.3°F | Resp 16 | Ht 65.0 in | Wt 200.0 lb

## 2020-03-22 DIAGNOSIS — Z90710 Acquired absence of both cervix and uterus: Secondary | ICD-10-CM | POA: Diagnosis not present

## 2020-03-22 DIAGNOSIS — M779 Enthesopathy, unspecified: Secondary | ICD-10-CM

## 2020-03-22 DIAGNOSIS — Z90722 Acquired absence of ovaries, bilateral: Secondary | ICD-10-CM | POA: Insufficient documentation

## 2020-03-22 DIAGNOSIS — M545 Low back pain, unspecified: Secondary | ICD-10-CM | POA: Diagnosis not present

## 2020-03-22 DIAGNOSIS — M47898 Other spondylosis, sacral and sacrococcygeal region: Secondary | ICD-10-CM | POA: Insufficient documentation

## 2020-03-22 DIAGNOSIS — Z882 Allergy status to sulfonamides status: Secondary | ICD-10-CM | POA: Diagnosis not present

## 2020-03-22 DIAGNOSIS — Z9079 Acquired absence of other genital organ(s): Secondary | ICD-10-CM | POA: Insufficient documentation

## 2020-03-22 DIAGNOSIS — G8929 Other chronic pain: Secondary | ICD-10-CM

## 2020-03-22 DIAGNOSIS — M533 Sacrococcygeal disorders, not elsewhere classified: Secondary | ICD-10-CM | POA: Insufficient documentation

## 2020-03-22 DIAGNOSIS — M4608 Spinal enthesopathy, sacral and sacrococcygeal region: Secondary | ICD-10-CM | POA: Diagnosis not present

## 2020-03-22 MED ORDER — LIDOCAINE HCL (PF) 2 % IJ SOLN
INTRAMUSCULAR | Status: AC
  Filled 2020-03-22: qty 10

## 2020-03-22 MED ORDER — ROPIVACAINE HCL 2 MG/ML IJ SOLN
4.0000 mL | Freq: Once | INTRAMUSCULAR | Status: AC
  Administered 2020-03-22: 4 mL via INTRA_ARTICULAR
  Filled 2020-03-22: qty 10

## 2020-03-22 MED ORDER — METHYLPREDNISOLONE ACETATE 80 MG/ML IJ SUSP
80.0000 mg | Freq: Once | INTRAMUSCULAR | Status: AC
  Administered 2020-03-22: 80 mg via INTRA_ARTICULAR
  Filled 2020-03-22: qty 1

## 2020-03-22 NOTE — Patient Instructions (Signed)

## 2020-03-22 NOTE — Progress Notes (Signed)
Safety precautions to be maintained throughout the outpatient stay will include: orient to surroundings, keep bed in low position, maintain call bell within reach at all times, provide assistance with transfer out of bed and ambulation.  

## 2020-03-22 NOTE — Progress Notes (Signed)
PROVIDER NOTE: Information contained herein reflects review and annotations entered in association with encounter. Interpretation of such information and data should be left to medically-trained personnel. Information provided to patient can be located elsewhere in the medical record under "Patient Instructions". Document created using STT-dictation technology, any transcriptional errors that may result from process are unintentional.    Patient: Jade Harris  Service Category: Procedure  Provider: Gaspar Cola, MD  DOB: August 21, 1965  DOS: 03/22/2020  Location: [facility]  MRN: [id number]  Setting: Ambulatory - outpatient  Referring Provider: Glean Hess, MD  Type: Established Patient  Specialty: Interventional Pain Management  PCP: Jade Hess, MD   Primary Reason for Visit: Interventional Pain Management Treatment. CC: Back Pain  Procedure:          Anesthesia, Analgesia, Anxiolysis:  Type: Diagnostic Sacroiliac Joint Steroid Injection #2  Region: Superior Lumbosacral Region Level: PSIS (Posterior Superior Iliac Spine), aiming at the midportion of the SI joint. Laterality: Right  Type: Local Anesthesia Indication(s): Analgesia         Route: Infiltration (Adrian/IM) IV Access: Declined Sedation: Declined  Local Anesthetic: Lidocaine 1-2%  Position: Prone           Indications: 1. Chronic sacroiliac joint pain (Right)   2. Enthesopathy of sacroiliac joint (Right)   3. Other spondylosis, sacral and sacrococcygeal region   4. Chronic low back pain (1ry area of Pain) (Bilateral) (R>L) w/o sciatica    Pain Score: Pre-procedure: 4 /10 Post-procedure: 3 /10   Pre-op H&P Assessment:  Jade Harris is a 55 y.o. (year old), female patient, seen today for interventional treatment. She  has a past surgical history that includes Nasal sinus surgery (04/2014); Tonsillectomy; Appendectomy; Total abdominal hysterectomy w/ bilateral salpingoophorectomy; Mouth  surgery; and Abdominal hysterectomy. Jade Harris has a current medication list which includes the following prescription(s): acetaminophen, b complex vitamins, calcipotriene-betameth diprop, duloxetine, estradiol, fluticasone, ibuprofen, multiple vitamins-minerals, triamterene-hydrochlorothiazide, and ergocalciferol. Her primarily concern today is the Back Pain  Initial Vital Signs:  Pulse/HCG Rate: 80ECG Heart Rate: 80 Temp: (!) 97.3 F (36.3 C) Resp: 16 BP: 134/89 SpO2: 100 %  BMI: Estimated body mass index is 33.28 kg/m as calculated from the following:   Height as of this encounter: 5\' 5"  (1.651 m).   Weight as of this encounter: 200 lb (90.7 kg).  Risk Assessment: Allergies: Reviewed. She is allergic to sulfa antibiotics.  Allergy Precautions: None required Coagulopathies: Reviewed. None identified.  Blood-thinner therapy: None at this time Active Infection(s): Reviewed. None identified. Jade Harris is afebrile  Site Confirmation: Jade Harris was asked to confirm the procedure and laterality before marking the site Procedure checklist: Completed Consent: Before the procedure and under the influence of no sedative(s), amnesic(s), or anxiolytics, the patient was informed of the treatment options, risks and possible complications. To fulfill our ethical and legal obligations, as recommended by the American Medical Association's Code of Ethics, I have informed the patient of my clinical impression; the nature and purpose of the treatment or procedure; the risks, benefits, and possible complications of the intervention; the alternatives, including doing nothing; the risk(s) and benefit(s) of the alternative treatment(s) or procedure(s); and the risk(s) and benefit(s) of doing nothing. The patient was provided information about the general risks and possible complications associated with the procedure. These may include, but are not limited to: failure to achieve desired goals, infection,  bleeding, organ or nerve damage, allergic reactions, paralysis, and death. In addition, the patient was informed  of those risks and complications associated to the procedure, such as failure to decrease pain; infection; bleeding; organ or nerve damage with subsequent damage to sensory, motor, and/or autonomic systems, resulting in permanent pain, numbness, and/or weakness of one or several areas of the body; allergic reactions; (i.e.: anaphylactic reaction); and/or death. Furthermore, the patient was informed of those risks and complications associated with the medications. These include, but are not limited to: allergic reactions (i.e.: anaphylactic or anaphylactoid reaction(s)); adrenal axis suppression; blood sugar elevation that in diabetics may result in ketoacidosis or comma; water retention that in patients with history of congestive heart failure may result in shortness of breath, pulmonary edema, and decompensation with resultant heart failure; weight gain; swelling or edema; medication-induced neural toxicity; particulate matter embolism and blood vessel occlusion with resultant organ, and/or nervous system infarction; and/or aseptic necrosis of one or more joints. Finally, the patient was informed that Medicine is not an exact science; therefore, there is also the possibility of unforeseen or unpredictable risks and/or possible complications that may result in a catastrophic outcome. The patient indicated having understood very clearly. We have given the patient no guarantees and we have made no promises. Enough time was given to the patient to ask questions, all of which were answered to the patient's satisfaction. Jade Harris has indicated that she wanted to continue with the procedure. Attestation: I, the ordering provider, attest that I have discussed with the patient the benefits, risks, side-effects, alternatives, likelihood of achieving goals, and potential problems during recovery for the  procedure that I have provided informed consent. Date  Time: 03/22/2020 11:28 AM  Pre-Procedure Preparation:  Monitoring: As per clinic protocol. Respiration, ETCO2, SpO2, BP, heart rate and rhythm monitor placed and checked for adequate function Safety Precautions: Patient was assessed for positional comfort and pressure points before starting the procedure. Time-out: I initiated and conducted the "Time-out" before starting the procedure, as per protocol. The patient was asked to participate by confirming the accuracy of the "Time Out" information. Verification of the correct person, site, and procedure were performed and confirmed by me, the nursing staff, and the patient. "Time-out" conducted as per Joint Commission's Universal Protocol (UP.01.01.01). Time: 1152  Description of Procedure:          Target Area: Mid-posterior, aspect of the sacroiliac fissure Approach: Posterior, paraspinal, ipsilateral approach. Area Prepped: Entire Lower Lumbosacral Region DuraPrep (Iodine Povacrylex [0.7% available iodine] and Isopropyl Alcohol, 74% w/w) Safety Precautions: Aspiration looking for blood return was conducted prior to all injections. At no point did we inject any substances, as a needle was being advanced. No attempts were made at seeking any paresthesias. Safe injection practices and needle disposal techniques used. Medications properly checked for expiration dates. SDV (single dose vial) medications used. Description of the Procedure: Protocol guidelines were followed. The patient was placed in position over the procedure table. The target area was identified and the area prepped in the usual manner. Skin & deeper tissues infiltrated with local anesthetic. Appropriate amount of time allowed to pass for local anesthetics to take effect. The procedure needle was advanced under fluoroscopic guidance into the sacroiliac joint until a firm endpoint was obtained. Proper needle placement secured. Negative  aspiration confirmed. Solution injected in intermittent fashion, asking for systemic symptoms every 0.5cc of injectate. The needles were then removed and the area cleansed, making sure to leave some of the prepping solution back to take advantage of its long term bactericidal properties. Vitals:   03/22/20 1148 03/22/20 1150  03/22/20 1155 03/22/20 1200  BP: (!) 132/95 (!) 138/97 (!) 136/99 137/82  Pulse:      Resp: 15 15 16 16   Temp:      SpO2: 97% 97% 97% 100%  Weight:      Height:        Start Time: 1152 hrs. End Time: 1158 hrs. Materials:  Needle(s) Type: Spinal Needle Gauge: 22G Length: 3.5-in Medication(s): Please see orders for medications and dosing details.  Imaging Guidance (Non-Spinal):          Type of Imaging Technique: Fluoroscopy Guidance (Non-Spinal) Indication(s): Assistance in needle guidance and placement for procedures requiring needle placement in or near specific anatomical locations not easily accessible without such assistance. Exposure Time: Please see nurses notes. Contrast: Before injecting any contrast, we confirmed that the patient did not have an allergy to iodine, shellfish, or radiological contrast. Once satisfactory needle placement was completed at the desired level, radiological contrast was injected. Contrast injected under live fluoroscopy. No contrast complications. See chart for type and volume of contrast used. Fluoroscopic Guidance: I was personally present during the use of fluoroscopy. "Tunnel Vision Technique" used to obtain the best possible view of the target area. Parallax error corrected before commencing the procedure. "Direction-depth-direction" technique used to introduce the needle under continuous pulsed fluoroscopy. Once target was reached, antero-posterior, oblique, and lateral fluoroscopic projection used confirm needle placement in all planes. Images permanently stored in EMR. Interpretation: I personally interpreted the imaging  intraoperatively. Adequate needle placement confirmed in multiple planes. Appropriate spread of contrast into desired area was observed. No evidence of afferent or efferent intravascular uptake. Permanent images saved into the patient's record.  Antibiotic Prophylaxis:   Anti-infectives (From admission, onward)   None     Indication(s): None identified  Post-operative Assessment:  Post-procedure Vital Signs:  Pulse/HCG Rate: 8067 Temp: (!) 97.3 F (36.3 C) Resp: 16 BP: 137/82 SpO2: 100 %  EBL: None  Complications: No immediate post-treatment complications observed by team, or reported by patient.  Note: The patient tolerated the entire procedure well. A repeat set of vitals were taken after the procedure and the patient was kept under observation following institutional policy, for this type of procedure. Post-procedural neurological assessment was performed, showing return to baseline, prior to discharge. The patient was provided with post-procedure discharge instructions, including a section on how to identify potential problems. Should any problems arise concerning this procedure, the patient was given instructions to immediately contact us, at any time, without hesitation. In any case, we plan to contact the patient by telephone for a follow-up status report regarding this interventional procedure.  Comments:  No additional relevant information.  Plan of Care  Orders:  Orders Placed This Encounter  Procedures  . SACROILIAC JOINT INJECTION    Scheduling Instructions:     Side: Right-sided     Sedation: No Sedation.     Timeframe: Today    Order Specific Question:   Where will this procedure be performed?    Answer:   [facility]  . [facility] C-ARM 1-60 MIN NO REPORT    Intraoperative interpretation by procedural physician at [place].    Standing Status:   Standing    Number of Occurrences:   1    Order Specific Question:   Reason for exam:     Answer:   Assistance in needle guidance and placement for procedures requiring needle placement in or near specific anatomical locations not easily accessible without such assistance.  Marland Kitchen  Informed Consent Details: Physician/Practitioner Attestation; Transcribe to consent form and obtain patient signature    Nursing Order: Transcribe to consent form and obtain patient signature. Note: Always confirm laterality of pain with Ms. Christofferson, before procedure.    Order Specific Question:   Physician/Practitioner attestation of informed consent for procedure/surgical case    Answer:   I, the physician/practitioner, attest that I have discussed with the patient the benefits, risks, side effects, alternatives, likelihood of achieving goals and potential problems during recovery for the procedure that I have provided informed consent.    Order Specific Question:   Procedure    Answer:   Sacroiliac Joint Block    Order Specific Question:   Physician/Practitioner performing the procedure    Answer:   Pat Sires A. Dossie Arbour, MD    Order Specific Question:   Indication/Reason    Answer:   Chronic Low Back and Hip Pain secondary to Sacroiliac Joint Pain (Arthralgia/Arthropathy)  . Provide equipment / supplies at bedside    "Block Tray" (Disposable  single use) Needle type: SpinalSpinal Amount/quantity: 1 Size: Medium (5-inch) Gauge: 22G    Standing Status:   Standing    Number of Occurrences:   1    Order Specific Question:   Specify    Answer:   Block Tray   Chronic Opioid Analgesic:  None MME/day: 0 mg/day   Medications ordered for procedure: Meds ordered this encounter  Medications  . methylPREDNISolone acetate (DEPO-MEDROL) injection 80 mg  . ropivacaine (PF) 2 mg/mL (0.2%) (NAROPIN) injection 4 mL   Medications administered: We administered methylPREDNISolone acetate and ropivacaine (PF) 2 mg/mL (0.2%).  See the medical record for exact dosing, route, and time of administration.  Follow-up  plan:   Return in 2 weeks (on 04/05/2020) for (PP) Follow-up, (VIrtual).       Interventional Therapies  Risk  Complexity Considerations:   WNL   Planned  Pending:      Under consideration:   Diagnostic bilateral lumbar facet medial branch block #2  Possible bilateral lumbar facet RFA  Diagnostic right SI joint block #1  Possible right SI joint RFA  Diagnostic bilateral cervical facet medial branch block #1  Possible bilateral cervical facet RFA  Diagnostic right caudal ESI + epidurogram    Completed:   Diagnostic bilateral lumbar facet MBB x1 (02/09/2020) (+S:50/50/50 x2 weeks) Diagnostic right (inferior) SI joint block x1 (03/01/2020)    Therapeutic  Palliative (PRN) options:   None established    Recent Visits Date Type Provider Dept  03/16/20 Office Visit Milinda Pointer, MD [facility]  03/01/20 Procedure visit Milinda Pointer, MD [facility]  02/24/20 Office Visit Milinda Pointer, MD [facility]  02/09/20 Procedure visit Milinda Pointer, MD [facility]  02/03/20 Office Visit Milinda Pointer, MD [facility]  01/25/20 Office Visit Milinda Pointer, MD [facility]  Showing recent visits within past 90 days and meeting all other requirements Today's Visits Date Type Provider Dept  03/22/20 Procedure visit Milinda Pointer, MD [facility]  Showing today's visits and meeting all other requirements Future Appointments Date Type Provider Dept  03/31/20 Appointment Milinda Pointer, MD [facility]  Showing future appointments within next 90 days and meeting all other requirements  Disposition: Discharge home  Discharge (Date  Time): 03/22/2020; 1205 hrs.   Primary Care Physician: Jade Hess, MD Location: [facility] Note by: Jade Cola, MD Date: 03/22/2020; Time: 12:21 PM  Disclaimer:  Medicine is not  an Chief Strategy Officer. The  only guarantee in medicine is that nothing is guaranteed. It is important to note that the decision to proceed with this intervention was based on the information collected from the patient. The Data and conclusions were drawn from the patient's questionnaire, the interview, and the physical examination. Because the information was provided in large part by the patient, it cannot be guaranteed that it has not been purposely or unconsciously manipulated. Every effort has been made to obtain as much relevant data as possible for this evaluation. It is important to note that the conclusions that lead to this procedure are derived in large part from the available data. Always take into account that the treatment will also be dependent on availability of resources and existing treatment guidelines, considered by other Pain Management Practitioners as being common knowledge and practice, at the time of the intervention. For Medico-Legal purposes, it is also important to point out that variation in procedural techniques and pharmacological choices are the acceptable norm. The indications, contraindications, technique, and results of the above procedure should only be interpreted and judged by a Board-Certified Interventional Pain Specialist with extensive familiarity and expertise in the same exact procedure and technique.

## 2020-03-23 ENCOUNTER — Telehealth: Payer: Self-pay

## 2020-03-23 NOTE — Telephone Encounter (Signed)
Called PP, no answer. Left message to call if needed.

## 2020-03-30 ENCOUNTER — Telehealth: Payer: Self-pay

## 2020-03-30 ENCOUNTER — Encounter: Payer: Self-pay | Admitting: Pain Medicine

## 2020-03-30 NOTE — Progress Notes (Signed)
Patient: Jade Harris  Service Category: E/M  Provider: Gaspar Cola, MD  DOB: 10-15-1965  DOS: 03/31/2020  Location: Office  MRN: [id number]  Setting: Ambulatory outpatient  Referring Provider: Glean Hess, MD  Type: Established Patient  Specialty: Interventional Pain Management  PCP: Glean Hess, MD  Location: Remote location  Delivery: TeleHealth     Virtual Encounter - Pain Management PROVIDER NOTE: Information contained herein reflects review and annotations entered in association with encounter. Interpretation of such information and data should be left to medically-trained personnel. Information provided to patient can be located elsewhere in the medical record under "Patient Instructions". Document created using STT-dictation technology, any transcriptional errors that may result from process are unintentional.    Contact & Pharmacy Preferred: [phone] Home: [phone] (home) Mobile: [phone] (mobile) E-mail: [email or web address]  [facility], [employer] [address] [place] [address] Phone: [phone] Fax: [phone]   Pre-screening  Ms. Dicostanzo offered "in-person" vs "virtual" encounter. She indicated preferring virtual for this encounter.   Reason COVID-19*  Social distancing based on CDC and AMA recommendations.   I contacted Meryl Dare on 03/31/2020 via telephone.      I clearly identified myself as Gaspar Cola, MD. I verified that I was speaking with the correct person using two identifiers (Name: ALAISHA EVERSLEY, and date of birth: 1965-10-09).  Consent I sought verbal advanced consent from Meryl Dare for virtual visit interactions. I informed Ms. Broussard of possible security and privacy concerns, risks, and limitations associated with providing "not-in-person" medical evaluation and management services. I also informed Ms. Hassan  of the availability of "in-person" appointments. Finally, I informed her that there would be a charge for the virtual visit and that she could be  personally, fully or partially, financially responsible for it. Ms. Ledger expressed understanding and agreed to proceed.   Historic Elements   Ms. KHLOI RAWL is a 55 y.o. year old, female patient evaluated today after our last contact on 03/22/2020. Ms. Hillesheim  has a past medical history of Cancer ([place]). She also  has a past surgical history that includes Nasal sinus surgery (04/2014); Tonsillectomy; Appendectomy; Total abdominal hysterectomy w/ bilateral salpingoophorectomy; Mouth surgery; and Abdominal hysterectomy. Ms. Azizi has a current medication list which includes the following prescription(s): acetaminophen, b complex vitamins, calcipotriene-betameth diprop, duloxetine, estradiol, fluticasone, ibuprofen, multiple vitamins-minerals, and triamterene-hydrochlorothiazide. She  reports that she quit smoking about 33 years ago. She has never used smokeless tobacco. She reports current alcohol use. She reports that she does not use drugs. Ms. Lupa is allergic to sulfa antibiotics.   HPI  Today, she is being contacted for a post-procedure assessment.  Post-Procedure Evaluation  Procedure (03/22/2020): Diagnostic right upper sacroiliac joint block #2 under fluoroscopic guidance, no sedation Pre-procedure pain level: 4/10 Post-procedure: 3/10 Limited initial benefit, possibly due to rapid discharge after no sedation procedure, without enough time to allow full onset of block.  Sedation: None.  Effectiveness during initial hour after procedure(Ultra-Short Term Relief): 100 %.  Local anesthetic used: Long-acting (4-6 hours) Effectiveness: Defined as any analgesic benefit obtained secondary to the administration of local anesthetics. This carries significant diagnostic value as to the etiological location, or anatomical origin, of the pain.  Duration of benefit is expected to coincide with the duration of the local anesthetic used.  Effectiveness during initial 4-6 hours after procedure(Short-Term Relief): 100 %.  Long-term  benefit: Defined as any relief past the pharmacologic duration of the local anesthetics.  Effectiveness past the initial 6 hours after procedure(Long-Term Relief): 90 %.  Current benefits: Defined as benefit that persist at this time.   Analgesia:  The patient indicates currently enjoying an 80% ongoing improvement of her right lower back pain. Function: Ms. Leckey reports improvement in function ROM: Ms. Bayne reports improvement in ROM  Statement of Medical Necessity:  Ms. 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  Duration: This pain has persisted for longer than three months.  Non-surgical care: The patient has either failed to respond, or was unable to tolerate, or simply did not get enough benefit from other more conservative therapies including, but not limited to: 1. Over-the-counter oral analgesic medications (i.e.: ibuprofen, naproxen, etc.) 2. Anti-inflammatory medications 3. Muscle relaxants 4. Membrane stabilizers 5. Opioids 6. Physical therapy (PT), chiropractic manipulation, and/or home exercise program (HEP).  (The patient did her physical therapy at [facility].) 7. Modalities (Heat, ice, etc.)  Invasive therapies: Nerve blocks and/or surgery have failed to provide any significant long-term benefit.  Surgical care: Not indicated.  Physical exam: Has been consistent with Sacroiliac Joint Pain Syndrome.  The patient has been shown to have positive reproduction of the right sacroiliac joint arthralgia on pelvic distraction test, Patrick maneuver, lateral pelvic contraction test, Gaenslen's test, as well as exquisite tenderness to palpation  over the right sacral sulcus.  Diagnostic interventional therapies: Ms. Keeling has attained greater than 50% reduction in pain from at least two (2) diagnostic medial branch blocks conducted in separate occasions.   For the above listed reason, I believe, as the examining and treating physician, that it is medically necessary to proceed with Non-Pulsed Radiofrequency Ablation for the purpose of attempting to prolong the duration of the benefits seen with the diagnostic injections.  Pharmacotherapy Assessment  Analgesic: No opioid analgesics prescribed by our practice. MME/day: 0 mg/day   Monitoring: [place] PMP: PDMP reviewed during this encounter.       Pharmacotherapy: No side-effects or adverse reactions reported. Compliance: No problems identified. Effectiveness: Clinically acceptable. Plan: Refer to "POC".  UDS:  Summary  Date Value Ref Range Status  01/25/2020 Note  Final    Comment:    ==================================================================== Compliance Drug Analysis, Ur ==================================================================== Test                             Result       Flag       Units  Drug Present and Declared for Prescription Verification   Duloxetine                     PRESENT      EXPECTED  Drug Present not Declared for Prescription Verification   Carboxy-THC                    272          UNEXPECTED ng/mg creat    Carboxy-THC is a metabolite of tetrahydrocannabinol (THC). Source of    THC is most commonly herbal marijuana or marijuana-based products,    but THC is also present in a scheduled prescription medication.    Trace amounts of THC can be present in hemp and cannabidiol (CBD)    products. This test is not intended to distinguish between delta-9-    tetrahydrocannabinol, the predominant  form of THC in most herbal or    marijuana-based products, and delta-8-tetrahydrocannabinol.    Methocarbamol                  PRESENT       UNEXPECTED   Guaifenesin                    PRESENT      UNEXPECTED    Guaifenesin may be administered as an over-the-counter or    prescription drug; it may also be present as a breakdown product of    methocarbamol.    Acetaminophen                  PRESENT      UNEXPECTED   Doxylamine                     PRESENT      UNEXPECTED   Dextromethorphan               PRESENT      UNEXPECTED   Dextrorphan/Levorphanol        PRESENT      UNEXPECTED    Dextrorphan is an expected metabolite of dextromethorphan, an over-    the-counter or prescription cough suppressant. Dextrorphan cannot be    distinguished from the scheduled prescription medication levorphanol    by the method used for analysis.  Drug Absent but Declared for Prescription Verification   Ibuprofen                      Not Detected UNEXPECTED    Ibuprofen, as indicated in the declared medication list, is not    always detected even when used as directed.  ==================================================================== Test                      Result    Flag   Units      Ref Range   Creatinine              149              mg/dL      >=20 ==================================================================== Declared Medications:  The flagging and interpretation on this report are based on the  following declared medications.  Unexpected results may arise from  inaccuracies in the declared medications.   **Note: The testing scope of this panel includes these medications:   Duloxetine   **Note: The testing scope of this panel does not include small to  moderate amounts of these reported medications:   Ibuprofen   **Note: The testing scope of this panel does not include the  following reported medications:   Cholecalciferol  Estradiol  Fluticasone  Hydrochlorothiazide  Multivitamin  Topical  Triamterene  Vitamin B ==================================================================== For clinical consultation,  please call [phone]. ====================================================================     Laboratory Chemistry Profile   Renal Lab Results  Component Value Date   BUN 14 01/25/2020   CREATININE 0.67 01/25/2020   BCR 21 01/25/2020   GFRAA 114 01/25/2020   GFRNONAA 99 01/25/2020     Hepatic Lab Results  Component Value Date   AST 16 01/25/2020   ALT 12 11/07/2018   ALBUMIN 4.4 01/25/2020   ALKPHOS 64 01/25/2020     Electrolytes Lab Results  Component Value Date   NA 141 01/25/2020   K 4.3 01/25/2020   CL 104 01/25/2020   CALCIUM 9.5 01/25/2020   MG 2.0 01/25/2020  Bone Lab Results  Component Value Date   25OHVITD1 29 (L) 01/25/2020   25OHVITD2 <1.0 01/25/2020   25OHVITD3 29 01/25/2020     Inflammation (CRP: Acute Phase) (ESR: Chronic Phase) Lab Results  Component Value Date   CRP 3 01/25/2020   ESRSEDRATE 21 01/25/2020       Note: Above Lab results reviewed.  Imaging  DG PAIN CLINIC C-ARM 1-60 MIN NO REPORT Fluoro was used, but no Radiologist interpretation will be provided.  Please refer to "NOTES" tab for provider progress note.        Assessment  The primary encounter diagnosis was Chronic pain syndrome. Diagnoses of Chronic sacroiliac joint pain (Right), Enthesopathy of sacroiliac joint (Right), and Chronic low back pain (1ry area of Pain) (Bilateral) (R>L) w/o sciatica were also pertinent to this visit.  Plan of Care  Problem-specific:  No problem-specific Assessment & Plan notes found for this encounter.  Ms. CAELEY DOHRMANN has a current medication list which includes the following long-term medication(s): duloxetine, estradiol, and fluticasone.  Pharmacotherapy (Medications Ordered): No orders of the defined types were placed in this encounter.  Orders:  Orders Placed This Encounter  Procedures  . Radiofrequency Sacroiliac Joint    Standing Status:   Future    Standing Expiration Date:   05/31/2020    Scheduling  Instructions:     Side(s): Right-sided     Level(s): L4, L5, S1, S2, & S3 Medial Branch Nerve(s)     Sedation: With Sedation.     Scheduling Timeframe: As soon as pre-approved     Procedure: Sacroiliac joint RFA   Follow-up plan:   Return for RFA (41mn): (R) SI RFA #1.      Interventional Therapies  Risk  Complexity Considerations:   WNL   Planned  Pending:   Therapeutic right SI joint RFA #1    Under consideration:   Diagnostic bilateral lumbar facet MBB #2  Possible bilateral lumbar facet RFA  Possible right SI joint RFA  Diagnostic bilateral cervical facet MBB #1  Possible bilateral cervical facet RFA  Diagnostic right caudal ESI + epidurogram    Completed:   Diagnostic bilateral lumbar facet MBB x1 (02/09/2020) (+S:50/50/50 x2 wks) Diagnostic right (inferior) SI joint block x1 (03/22/2020) (100/100/100 x2 wks/85)  Diagnostic right (superior) SI joint block x1 (03/22/2020) (100/100/90/90)   Therapeutic  Palliative (PRN) options:   None established    Recent Visits Date Type Provider Dept  03/22/20 Procedure visit NMilinda Pointer MD [facility]  03/16/20 Office Visit NMilinda Pointer MD [facility]  03/01/20 Procedure visit NMilinda Pointer MD [facility]  02/24/20 Office Visit NMilinda Pointer MD [facility]  02/09/20 Procedure visit NMilinda Pointer MD [facility]  02/03/20 Office Visit NMilinda Pointer MD [facility]  01/25/20 Office Visit NMilinda Pointer MD [facility]  Showing recent visits within past 90 days and meeting all other requirements Today's Visits Date Type Provider Dept  03/31/20 Telemedicine NMilinda Pointer MD [facility]  Showing today's visits and meeting all other requirements Future Appointments No visits were found meeting these conditions. Showing future appointments within next 90 days and meeting all other requirements  I discussed  the assessment and treatment plan with the patient. The patient was provided an opportunity to ask questions and all were answered. The patient agreed with the plan and demonstrated an understanding of the instructions.  Patient advised to call back or seek an in-person evaluation if the symptoms or condition  worsens.  Duration of encounter: 16 minutes.  Note by: Gaspar Cola, MD Date: 03/31/2020; Time: 2:31 PM

## 2020-03-31 ENCOUNTER — Other Ambulatory Visit: Payer: Self-pay

## 2020-03-31 ENCOUNTER — Ambulatory Visit: Payer: BC Managed Care – PPO | Attending: Pain Medicine | Admitting: Pain Medicine

## 2020-03-31 DIAGNOSIS — M779 Enthesopathy, unspecified: Secondary | ICD-10-CM

## 2020-03-31 DIAGNOSIS — M533 Sacrococcygeal disorders, not elsewhere classified: Secondary | ICD-10-CM | POA: Diagnosis present

## 2020-03-31 DIAGNOSIS — G8929 Other chronic pain: Secondary | ICD-10-CM | POA: Diagnosis present

## 2020-03-31 DIAGNOSIS — M545 Low back pain, unspecified: Secondary | ICD-10-CM

## 2020-03-31 DIAGNOSIS — G894 Chronic pain syndrome: Secondary | ICD-10-CM | POA: Diagnosis not present

## 2020-03-31 NOTE — Patient Instructions (Signed)
____________________________________________________________________________________________  Preparing for Procedure with Sedation  Procedure appointments are limited to planned procedures: . No Prescription Refills. . No disability issues will be discussed. . No medication changes will be discussed.  Instructions: . Oral Intake: Do not eat or drink anything for at least 8 hours prior to your procedure. (Exception: Blood Pressure Medication. See below.) . Transportation: Unless otherwise stated by your physician, you may drive yourself after the procedure. . Blood Pressure Medicine: Do not forget to take your blood pressure medicine with a sip of water the morning of the procedure. If your Diastolic (lower reading)is above 100 mmHg, elective cases will be cancelled/rescheduled. . Blood thinners: These will need to be stopped for procedures. Notify our staff if you are taking any blood thinners. Depending on which one you take, there will be specific instructions on how and when to stop it. . Diabetics on insulin: Notify the staff so that you can be scheduled 1st case in the morning. If your diabetes requires high dose insulin, take only  of your normal insulin dose the morning of the procedure and notify the staff that you have done so. . Preventing infections: Shower with an antibacterial soap the morning of your procedure. . Build-up your immune system: Take 1000 mg of Vitamin C with every meal (3 times a day) the day prior to your procedure. . Antibiotics: Inform the staff if you have a condition or reason that requires you to take antibiotics before dental procedures. . Pregnancy: If you are pregnant, call and cancel the procedure. . Sickness: If you have a cold, fever, or any active infections, call and cancel the procedure. . Arrival: You must be in the facility at least 30 minutes prior to your scheduled procedure. . Children: Do not bring children with you. . Dress appropriately:  Bring dark clothing that you would not mind if they get stained. . Valuables: Do not bring any jewelry or valuables.  Reasons to call and reschedule or cancel your procedure: (Following these recommendations will minimize the risk of a serious complication.) . Surgeries: Avoid having procedures within 2 weeks of any surgery. (Avoid for 2 weeks before or after any surgery). . Flu Shots: Avoid having procedures within 2 weeks of a flu shots or . (Avoid for 2 weeks before or after immunizations). . Barium: Avoid having a procedure within 7-10 days after having had a radiological study involving the use of radiological contrast. (Myelograms, Barium swallow or enema study). . Heart attacks: Avoid any elective procedures or surgeries for the initial 6 months after a "Myocardial Infarction" (Heart Attack). . Blood thinners: It is imperative that you stop these medications before procedures. Let us know if you if you take any blood thinner.  . Infection: Avoid procedures during or within two weeks of an infection (including chest colds or gastrointestinal problems). Symptoms associated with infections include: Localized redness, fever, chills, night sweats or profuse sweating, burning sensation when voiding, cough, congestion, stuffiness, runny nose, sore throat, diarrhea, nausea, vomiting, cold or Flu symptoms, recent or current infections. It is specially important if the infection is over the area that we intend to treat. . Heart and lung problems: Symptoms that may suggest an active cardiopulmonary problem include: cough, chest pain, breathing difficulties or shortness of breath, dizziness, ankle swelling, uncontrolled high or unusually low blood pressure, and/or palpitations. If you are experiencing any of these symptoms, cancel your procedure and contact your primary care physician for an evaluation.  Remember:  Regular Business hours are:    Monday to Thursday 8:00 AM to 4:00 PM  Provider's  Schedule: Luvina Poirier, MD:  Procedure days: Tuesday and Thursday 7:30 AM to 4:00 PM  Bilal Lateef, MD:  Procedure days: Monday and Wednesday 7:30 AM to 4:00 PM ____________________________________________________________________________________________   ____________________________________________________________________________________________  General Risks and Possible Complications  Patient Responsibilities: It is important that you read this as it is part of your informed consent. It is our duty to inform you of the risks and possible complications associated with treatments offered to you. It is your responsibility as a patient to read this and to ask questions about anything that is not clear or that you believe was not covered in this document.  Patient's Rights: You have the right to refuse treatment. You also have the right to change your mind, even after initially having agreed to have the treatment done. However, under this last option, if you wait until the last second to change your mind, you may be charged for the materials used up to that point.  Introduction: Medicine is not an exact science. Everything in Medicine, including the lack of treatment(s), carries the potential for danger, harm, or loss (which is by definition: Risk). In Medicine, a complication is a secondary problem, condition, or disease that can aggravate an already existing one. All treatments carry the risk of possible complications. The fact that a side effects or complications occurs, does not imply that the treatment was conducted incorrectly. It must be clearly understood that these can happen even when everything is done following the highest safety standards.  No treatment: You can choose not to proceed with the proposed treatment alternative. The "PRO(s)" would include: avoiding the risk of complications associated with the therapy. The "CON(s)" would include: not getting any of the treatment  benefits. These benefits fall under one of three categories: diagnostic; therapeutic; and/or palliative. Diagnostic benefits include: getting information which can ultimately lead to improvement of the disease or symptom(s). Therapeutic benefits are those associated with the successful treatment of the disease. Finally, palliative benefits are those related to the decrease of the primary symptoms, without necessarily curing the condition (example: decreasing the pain from a flare-up of a chronic condition, such as incurable terminal cancer).  General Risks and Complications: These are associated to most interventional treatments. They can occur alone, or in combination. They fall under one of the following six (6) categories: no benefit or worsening of symptoms; bleeding; infection; nerve damage; allergic reactions; and/or death. 1. No benefits or worsening of symptoms: In Medicine there are no guarantees, only probabilities. No healthcare provider can ever guarantee that a medical treatment will work, they can only state the probability that it may. Furthermore, there is always the possibility that the condition may worsen, either directly, or indirectly, as a consequence of the treatment. 2. Bleeding: This is more common if the patient is taking a blood thinner, either prescription or over the counter (example: Goody Powders, Fish oil, Aspirin, Garlic, etc.), or if suffering a condition associated with impaired coagulation (example: Hemophilia, cirrhosis of the liver, low platelet counts, etc.). However, even if you do not have one on these, it can still happen. If you have any of these conditions, or take one of these drugs, make sure to notify your treating physician. 3. Infection: This is more common in patients with a compromised immune system, either due to disease (example: diabetes, cancer, human immunodeficiency virus [HIV], etc.), or due to medications or treatments (example: therapies used to treat  cancer and   rheumatological diseases). However, even if you do not have one on these, it can still happen. If you have any of these conditions, or take one of these drugs, make sure to notify your treating physician. 4. Nerve Damage: This is more common when the treatment is an invasive one, but it can also happen with the use of medications, such as those used in the treatment of cancer. The damage can occur to small secondary nerves, or to large primary ones, such as those in the spinal cord and brain. This damage may be temporary or permanent and it may lead to impairments that can range from temporary numbness to permanent paralysis and/or brain death. 5. Allergic Reactions: Any time a substance or material comes in contact with our body, there is the possibility of an allergic reaction. These can range from a mild skin rash (contact dermatitis) to a severe systemic reaction (anaphylactic reaction), which can result in death. 6. Death: In general, any medical intervention can result in death, most of the time due to an unforeseen complication. ____________________________________________________________________________________________   

## 2020-04-05 ENCOUNTER — Telehealth: Payer: Self-pay

## 2020-04-05 NOTE — Telephone Encounter (Signed)
Insurance denied auth for SI RFA. They are saying it is investigational. Treatment of sacral levels are always considered investigational.

## 2020-04-12 NOTE — Telephone Encounter (Signed)
The patient called and left a vm asking if there was anything she could do to convince her insurance company to approve this. I tried to call her back but no answer and no vm available. There is nothing she can do as far as Biomedical engineer go, it's all about the criteria being met for them. The SIJI RFA is considered investigational and will not be authorized.

## 2020-04-19 NOTE — Telephone Encounter (Signed)
The patient called back asking about update for what she can do now. I suggest Dr. Dossie Arbour order a Lumbar RFA. The insurance will not approve SI RFA. But I can probably get the Lumbar RFA authorized. Please respond with what you want to do next. She is in pain, and I put this message back last week with no response. Thanks

## 2020-05-11 NOTE — Progress Notes (Signed)
PROVIDER NOTE: Information contained herein reflects review and annotations entered in association with encounter. Interpretation of such information and data should be left to medically-trained personnel. Information provided to patient can be located elsewhere in the medical record under "Patient Instructions". Document created using STT-dictation technology, any transcriptional errors that may result from process are unintentional.    Patient: Jade Harris  Service Category: E/M  Provider: Gaspar Cola, MD  DOB: [birth year]  DOS: 05/12/2020  Specialty: Interventional Pain Management  MRN: [id number]  Setting: Ambulatory outpatient  PCP: Glean Hess, MD  Type: Established Patient    Referring Provider: Glean Hess, MD  Location: Office  Delivery: Face-to-face     HPI  Jade Harris, a 55 y.o. year old female, is here today because of her Lumbosacral facet joint syndrome [M47.817]. Jade Harris's primary complain today is Back Pain Last encounter: My last encounter with her was on 03/22/2020. Pertinent problems: Jade Harris has Degenerative arthritis of lumbar spine; Headache, chronic daily; Venous insufficiency of both lower extremities; Chronic pain syndrome; Chronic low back pain (1ry area of Pain) (Bilateral) (R>L) w/o sciatica; Lumbosacral facet syndrome (Bilateral) (R>L); Chronic musculoskeletal pain; Chronic lower extremity pain (2ry area of Pain) (Right); Chronic groin pain (Intermittent) (Right); Lumbosacral radiculopathy/radiculitis at L2 (Right); Chronic neck pain (Bilateral) (L>R); Cervicalgia; Cervicogenic headache (Left); Cervico-occipital neuralgia (Left); Radiculitis involving upper extremity (Left); Numbness and tingling of upper extremity (Left); DDD (degenerative disc disease), cervical; DDD (degenerative disc disease), lumbosacral; Discogenic low back pain; Lumbar facet hypertrophy; Cervical facet hypertrophy (Multilevel) (Bilateral); 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 Pain Assessment: Severity of Chronic pain is reported as a 6 /10. Location: Back Lower/right buttocks down back of leg above the knee. Onset: More than a month ago. Quality: Aching,Burning,Discomfort,Pressure,Sharp. Timing: Constant. Modifying factor(s): changing positions. Vitals:  height is _0  (1.651 m) and weight is 200 lb (90.7 kg). Her temperature is 97 F (36.1 C) (abnormal). Her blood pressure is 145/91 (abnormal) and her pulse is 81. Her respiration is 16 and oxygen saturation is 98%.   Reason for encounter: worsening of previously known (established) problem.  The patient returns today with increased pain in the lower portion of the back and both hips.  She is unable to stand up straight.  She is having difficulty walking secondary to the back pain.  She is experiencing some lower extremity pain through the back of the leg not going any further than the knee, which has been determined to be referred pain from the lumbar facets.  The patient had exquisite tenderness to palpation and had exact reproduction of the pain with provocative hyperextension on rotation maneuvers of the lumbar spine as well as a positive Kemp maneuver.  The patient indicates having had physical therapy with [facility] which she was unable to tolerate due to increase in the pain.  She also attempted chiropractic manipulation and acupuncture both of which failed in providing her with any relief of the pain.  We did a diagnostic bilateral lumbar facet block which provided her with 50% relief of the pain initially, but improved significantly thereafter.  She did have 100% relief of the pain for the duration of the local anesthetic.  Later we did a  diagnostic bilateral sacroiliac joint block, which again provide her with significant relief of the  pain around the PSIS area, but not as much relief of the lower back as with the facet joints.  Today, based on the physical exam, history, prior nerve blocks, and imaging studies showing lumbar facet hypertrophy, we will be scheduling the patient for a second diagnostic lumbar facet block.  The long-term plan here is to do radiofrequency ablation for the purpose of providing her with long-term benefit and increase range of motion as well as increasing her ability to do more and be more functional.  At this point, as the patient's examining physician and treating physician, I believe it to be medically necessary to proceed with the second diagnostic bilateral lumbar facet block.  We will schedule her as soon as possible.  Pharmacotherapy Assessment   Analgesic: No opioid analgesics prescribed by our practice. MME/day: 0 mg/day   Monitoring: [place] PMP: PDMP reviewed during this encounter.       Pharmacotherapy: No side-effects or adverse reactions reported. Compliance: No problems identified. Effectiveness: Clinically acceptable.  No notes on file  UDS:  Summary  Date Value Ref Range Status  01/25/2020 Note  Final    Comment:    ==================================================================== Compliance Drug Analysis, Ur ==================================================================== Test                             Result       Flag       Units  Drug Present and Declared for Prescription Verification   Duloxetine                     PRESENT      EXPECTED  Drug Present not Declared for Prescription Verification   Carboxy-THC                    272          UNEXPECTED ng/mg creat    Carboxy-THC is a metabolite of tetrahydrocannabinol (THC). Source of    THC is most commonly herbal marijuana or marijuana-based products,    but THC is also present in a scheduled prescription  medication.    Trace amounts of THC can be present in hemp and cannabidiol (CBD)    products. This test is not intended to distinguish between delta-9-    tetrahydrocannabinol, the predominant form of THC in most herbal or    marijuana-based products, and delta-8-tetrahydrocannabinol.    Methocarbamol                  PRESENT      UNEXPECTED   Guaifenesin                    PRESENT      UNEXPECTED    Guaifenesin may be administered as an over-the-counter or    prescription drug; it may also be present as a breakdown product of    methocarbamol.    Acetaminophen                  PRESENT      UNEXPECTED   Doxylamine                     PRESENT      UNEXPECTED   Dextromethorphan               PRESENT      UNEXPECTED   Dextrorphan/Levorphanol        PRESENT  UNEXPECTED    Dextrorphan is an expected metabolite of dextromethorphan, an over-    the-counter or prescription cough suppressant. Dextrorphan cannot be    distinguished from the scheduled prescription medication levorphanol    by the method used for analysis.  Drug Absent but Declared for Prescription Verification   Ibuprofen                      Not Detected UNEXPECTED    Ibuprofen, as indicated in the declared medication list, is not    always detected even when used as directed.  ==================================================================== Test                      Result    Flag   Units      Ref Range   Creatinine              149              mg/dL      >=20 ==================================================================== Declared Medications:  The flagging and interpretation on this report are based on the  following declared medications.  Unexpected results may arise from  inaccuracies in the declared medications.   **Note: The testing scope of this panel includes these medications:   Duloxetine   **Note: The testing scope of this panel does not include small to  moderate amounts of these reported  medications:   Ibuprofen   **Note: The testing scope of this panel does not include the  following reported medications:   Cholecalciferol  Estradiol  Fluticasone  Hydrochlorothiazide  Multivitamin  Topical  Triamterene  Vitamin B ==================================================================== For clinical consultation, please call [phone]. ====================================================================      ROS  Constitutional: Denies any fever or chills Gastrointestinal: No reported hemesis, hematochezia, vomiting, or acute GI distress Musculoskeletal: Denies any acute onset joint swelling, redness, loss of ROM, or weakness Neurological: No reported episodes of acute onset apraxia, aphasia, dysarthria, agnosia, amnesia, paralysis, loss of coordination, or loss of consciousness  Medication Review  B Complex Vitamins, Calcipotriene-Betameth Diprop, DULoxetine, Multiple Vitamins-Minerals, acetaminophen, diphenhydrAMINE, estradiol, fluticasone, ibuprofen, and triamterene-hydrochlorothiazide  History Review  Allergy: Jade Harris is allergic to sulfa antibiotics. Drug: Jade Harris  reports no history of drug use. Alcohol:  reports current alcohol use. Tobacco:  reports that she quit smoking about 33 years ago. She has never used smokeless tobacco. Social: Jade Harris  reports that she quit smoking about 33 years ago. She has never used smokeless tobacco. She reports current alcohol use. She reports that she does not use drugs. Medical:  has a past medical history of Cancer ([place]). Surgical: Jade Harris  has a past surgical history that includes Nasal sinus surgery (04/2014); Tonsillectomy; Appendectomy; Total abdominal hysterectomy w/ bilateral salpingoophorectomy; Mouth surgery; and Abdominal hysterectomy. Family: family history includes Hypertension in her mother.  Laboratory Chemistry Profile   Renal Lab Results  Component Value Date   BUN 14 01/25/2020    CREATININE 0.67 01/25/2020   BCR 21 01/25/2020   GFRAA 114 01/25/2020   GFRNONAA 99 01/25/2020     Hepatic Lab Results  Component Value Date   AST 16 01/25/2020   ALT 12 11/07/2018   ALBUMIN 4.4 01/25/2020   ALKPHOS 64 01/25/2020     Electrolytes Lab Results  Component Value Date   NA 141 01/25/2020   K 4.3 01/25/2020   CL 104 01/25/2020   CALCIUM 9.5 01/25/2020   MG 2.0 01/25/2020  Bone Lab Results  Component Value Date   25OHVITD1 29 (L) 01/25/2020   25OHVITD2 <1.0 01/25/2020   25OHVITD3 29 01/25/2020     Inflammation (CRP: Acute Phase) (ESR: Chronic Phase) Lab Results  Component Value Date   CRP 3 01/25/2020   ESRSEDRATE 21 01/25/2020       Note: Above Lab results reviewed.  Recent Imaging Review  DG PAIN CLINIC C-ARM 1-60 MIN NO REPORT Fluoro was used, but no Radiologist interpretation will be provided.  Please refer to "NOTES" tab for provider progress note. Note: Reviewed        Physical Exam  General appearance: Well nourished, well developed, and well hydrated. In no apparent acute distress Mental status: Alert, oriented x 3 (person, place, & time)       Respiratory: No evidence of acute respiratory distress Eyes: PERLA Vitals: BP (!) 145/91   Pulse 81   Temp (!) 97 F (36.1 C)   Resp 16   Ht _0  (1.651 m)   Wt 200 lb (90.7 kg)   SpO2 98%   BMI 33.28 kg/m  BMI: Estimated body mass index is 33.28 kg/m as calculated from the following:   Height as of this encounter: _1  (1.651 m).   Weight as of this encounter: 200 lb (90.7 kg). Ideal: Ideal body weight: 57 kg (125 lb 10.6 oz) Adjusted ideal body weight: 70.5 kg (155 lb 6.4 oz)  Assessment   Status Diagnosis  Worsened Worsened Persistent 1. Lumbosacral facet syndrome (Bilateral) (R>L)   2. Chronic low back pain (1ry area of Pain) (Bilateral) (R>L) w/o sciatica   3. Lumbar facet hypertrophy   4. Chronic sacroiliac joint pain (Right)   5. Chronic groin pain (Intermittent)  (Right)   6. Chronic pain syndrome      Updated Problems: No problems updated.  Plan of Care  Problem-specific:  No problem-specific Assessment & Plan notes found for this encounter.  Jade Harris has a current medication list which includes the following long-term medication(s): duloxetine, estradiol, and fluticasone.  Pharmacotherapy (Medications Ordered): No orders of the defined types were placed in this encounter.  Orders:  Orders Placed This Encounter  Procedures  . LUMBAR FACET(MEDIAL BRANCH NERVE BLOCK) MBNB    Standing Status:   Future    Standing Expiration Date:   06/11/2020    Scheduling Instructions:     Procedure: Lumbar facet block (AKA.: Lumbosacral medial branch nerve block)     Side: Bilateral     Level: L3-4, L4-5, & L5-S1 Facets (L2, L3, L4, L5, & S1 Medial Branch Nerves)     Sedation: Patient's choice.     Timeframe: ASAA    Order Specific Question:   Where will this procedure be performed?    Answer:   [facility]   Follow-up plan:   Return for Procedure (w/ sedation): (B) L-FCT BLK #2.      Interventional Therapies  Risk  Complexity Considerations:   NO OPIOIDS!!! (01/25/2020) Abn UDS (+) for carboxy-THC (marijuana)   Planned  Pending:   Therapeutic right SI joint RFA #1    Under consideration:   Diagnostic bilateral lumbar facet MBB #2  Possible bilateral lumbar facet RFA  Possible right SI joint RFA  Diagnostic bilateral cervical facet MBB #1  Possible bilateral cervical facet RFA  Diagnostic right caudal ESI + epidurogram    Completed:   Diagnostic bilateral lumbar facet MBB x1 (02/09/2020) (+S:50/50/50 x2 wks) Diagnostic right (inferior) SI joint block x1 (03/22/2020) (  100/100/100 x2 wks/85)  Diagnostic right (superior) SI joint block x1 (03/22/2020) (100/100/90/90)   Therapeutic  Palliative (PRN) options:   None established    Recent Visits Date Type Provider Dept  03/31/20 Telemedicine Milinda Pointer, MD  [facility]  03/22/20 Procedure visit Milinda Pointer, MD [facility]  03/16/20 Office Visit Milinda Pointer, MD [facility]  03/01/20 Procedure visit Milinda Pointer, MD [facility]  02/24/20 Office Visit Milinda Pointer, MD [facility]  Showing recent visits within past 90 days and meeting all other requirements Today's Visits Date Type Provider Dept  05/12/20 Office Visit Milinda Pointer, MD [facility]  Showing today's visits and meeting all other requirements Future Appointments Date Type Provider Dept  05/31/20 Appointment Milinda Pointer, MD [facility]  Showing future appointments within next 90 days and meeting all other requirements  I discussed the assessment and treatment plan with the patient. The patient was provided an opportunity to ask questions and all were answered. The patient agreed with the plan and demonstrated an understanding of the instructions.  Patient advised to call back or seek an in-person evaluation if the symptoms or condition worsens.  Duration of encounter: 30 minutes.  Note by: Gaspar Cola, MD Date: 05/12/2020; Time: 3:00 PM

## 2020-05-12 ENCOUNTER — Ambulatory Visit: Payer: BC Managed Care – PPO | Attending: Pain Medicine | Admitting: Pain Medicine

## 2020-05-12 ENCOUNTER — Encounter: Payer: Self-pay | Admitting: Pain Medicine

## 2020-05-12 ENCOUNTER — Other Ambulatory Visit: Payer: Self-pay

## 2020-05-12 VITALS — BP 145/91 | HR 81 | Temp 97.0°F | Resp 16 | Ht 65.0 in | Wt 200.0 lb

## 2020-05-12 DIAGNOSIS — G8929 Other chronic pain: Secondary | ICD-10-CM | POA: Insufficient documentation

## 2020-05-12 DIAGNOSIS — M545 Low back pain, unspecified: Secondary | ICD-10-CM | POA: Insufficient documentation

## 2020-05-12 DIAGNOSIS — R1031 Right lower quadrant pain: Secondary | ICD-10-CM | POA: Diagnosis present

## 2020-05-12 DIAGNOSIS — G894 Chronic pain syndrome: Secondary | ICD-10-CM | POA: Insufficient documentation

## 2020-05-12 DIAGNOSIS — M47816 Spondylosis without myelopathy or radiculopathy, lumbar region: Secondary | ICD-10-CM | POA: Insufficient documentation

## 2020-05-12 DIAGNOSIS — M533 Sacrococcygeal disorders, not elsewhere classified: Secondary | ICD-10-CM | POA: Diagnosis present

## 2020-05-12 DIAGNOSIS — M47817 Spondylosis without myelopathy or radiculopathy, lumbosacral region: Secondary | ICD-10-CM | POA: Diagnosis present

## 2020-05-12 NOTE — Patient Instructions (Signed)
____________________________________________________________________________________________  Preparing for Procedure with Sedation  Procedure appointments are limited to planned procedures: . No Prescription Refills. . No disability issues will be discussed. . No medication changes will be discussed.  Instructions: . Oral Intake: Do not eat or drink anything for at least 8 hours prior to your procedure. (Exception: Blood Pressure Medication. See below.) . Transportation: Unless otherwise stated by your physician, you may drive yourself after the procedure. . Blood Pressure Medicine: Do not forget to take your blood pressure medicine with a sip of water the morning of the procedure. If your Diastolic (lower reading)is above 100 mmHg, elective cases will be cancelled/rescheduled. . Blood thinners: These will need to be stopped for procedures. Notify our staff if you are taking any blood thinners. Depending on which one you take, there will be specific instructions on how and when to stop it. . Diabetics on insulin: Notify the staff so that you can be scheduled 1st case in the morning. If your diabetes requires high dose insulin, take only  of your normal insulin dose the morning of the procedure and notify the staff that you have done so. . Preventing infections: Shower with an antibacterial soap the morning of your procedure. . Build-up your immune system: Take 1000 mg of Vitamin C with every meal (3 times a day) the day prior to your procedure. . Antibiotics: Inform the staff if you have a condition or reason that requires you to take antibiotics before dental procedures. . Pregnancy: If you are pregnant, call and cancel the procedure. . Sickness: If you have a cold, fever, or any active infections, call and cancel the procedure. . Arrival: You must be in the facility at least 30 minutes prior to your scheduled procedure. . Children: Do not bring children with you. . Dress appropriately:  Bring dark clothing that you would not mind if they get stained. . Valuables: Do not bring any jewelry or valuables.  Reasons to call and reschedule or cancel your procedure: (Following these recommendations will minimize the risk of a serious complication.) . Surgeries: Avoid having procedures within 2 weeks of any surgery. (Avoid for 2 weeks before or after any surgery). . Flu Shots: Avoid having procedures within 2 weeks of a flu shots or . (Avoid for 2 weeks before or after immunizations). . Barium: Avoid having a procedure within 7-10 days after having had a radiological study involving the use of radiological contrast. (Myelograms, Barium swallow or enema study). . Heart attacks: Avoid any elective procedures or surgeries for the initial 6 months after a "Myocardial Infarction" (Heart Attack). . Blood thinners: It is imperative that you stop these medications before procedures. Let us know if you if you take any blood thinner.  . Infection: Avoid procedures during or within two weeks of an infection (including chest colds or gastrointestinal problems). Symptoms associated with infections include: Localized redness, fever, chills, night sweats or profuse sweating, burning sensation when voiding, cough, congestion, stuffiness, runny nose, sore throat, diarrhea, nausea, vomiting, cold or Flu symptoms, recent or current infections. It is specially important if the infection is over the area that we intend to treat. . Heart and lung problems: Symptoms that may suggest an active cardiopulmonary problem include: cough, chest pain, breathing difficulties or shortness of breath, dizziness, ankle swelling, uncontrolled high or unusually low blood pressure, and/or palpitations. If you are experiencing any of these symptoms, cancel your procedure and contact your primary care physician for an evaluation.  Remember:  Regular Business hours are:    Monday to Thursday 8:00 AM to 4:00 PM  Provider's  Schedule: Lyndel Sarate, MD:  Procedure days: Tuesday and Thursday 7:30 AM to 4:00 PM  Bilal Lateef, MD:  Procedure days: Monday and Wednesday 7:30 AM to 4:00 PM ____________________________________________________________________________________________   ____________________________________________________________________________________________  General Risks and Possible Complications  Patient Responsibilities: It is important that you read this as it is part of your informed consent. It is our duty to inform you of the risks and possible complications associated with treatments offered to you. It is your responsibility as a patient to read this and to ask questions about anything that is not clear or that you believe was not covered in this document.  Patient's Rights: You have the right to refuse treatment. You also have the right to change your mind, even after initially having agreed to have the treatment done. However, under this last option, if you wait until the last second to change your mind, you may be charged for the materials used up to that point.  Introduction: Medicine is not an exact science. Everything in Medicine, including the lack of treatment(s), carries the potential for danger, harm, or loss (which is by definition: Risk). In Medicine, a complication is a secondary problem, condition, or disease that can aggravate an already existing one. All treatments carry the risk of possible complications. The fact that a side effects or complications occurs, does not imply that the treatment was conducted incorrectly. It must be clearly understood that these can happen even when everything is done following the highest safety standards.  No treatment: You can choose not to proceed with the proposed treatment alternative. The "PRO(s)" would include: avoiding the risk of complications associated with the therapy. The "CON(s)" would include: not getting any of the treatment  benefits. These benefits fall under one of three categories: diagnostic; therapeutic; and/or palliative. Diagnostic benefits include: getting information which can ultimately lead to improvement of the disease or symptom(s). Therapeutic benefits are those associated with the successful treatment of the disease. Finally, palliative benefits are those related to the decrease of the primary symptoms, without necessarily curing the condition (example: decreasing the pain from a flare-up of a chronic condition, such as incurable terminal cancer).  General Risks and Complications: These are associated to most interventional treatments. They can occur alone, or in combination. They fall under one of the following six (6) categories: no benefit or worsening of symptoms; bleeding; infection; nerve damage; allergic reactions; and/or death. 1. No benefits or worsening of symptoms: In Medicine there are no guarantees, only probabilities. No healthcare provider can ever guarantee that a medical treatment will work, they can only state the probability that it may. Furthermore, there is always the possibility that the condition may worsen, either directly, or indirectly, as a consequence of the treatment. 2. Bleeding: This is more common if the patient is taking a blood thinner, either prescription or over the counter (example: Goody Powders, Fish oil, Aspirin, Garlic, etc.), or if suffering a condition associated with impaired coagulation (example: Hemophilia, cirrhosis of the liver, low platelet counts, etc.). However, even if you do not have one on these, it can still happen. If you have any of these conditions, or take one of these drugs, make sure to notify your treating physician. 3. Infection: This is more common in patients with a compromised immune system, either due to disease (example: diabetes, cancer, human immunodeficiency virus [HIV], etc.), or due to medications or treatments (example: therapies used to treat  cancer and   rheumatological diseases). However, even if you do not have one on these, it can still happen. If you have any of these conditions, or take one of these drugs, make sure to notify your treating physician. 4. Nerve Damage: This is more common when the treatment is an invasive one, but it can also happen with the use of medications, such as those used in the treatment of cancer. The damage can occur to small secondary nerves, or to large primary ones, such as those in the spinal cord and brain. This damage may be temporary or permanent and it may lead to impairments that can range from temporary numbness to permanent paralysis and/or brain death. 5. Allergic Reactions: Any time a substance or material comes in contact with our body, there is the possibility of an allergic reaction. These can range from a mild skin rash (contact dermatitis) to a severe systemic reaction (anaphylactic reaction), which can result in death. 6. Death: In general, any medical intervention can result in death, most of the time due to an unforeseen complication. ____________________________________________________________________________________________   

## 2020-05-31 ENCOUNTER — Ambulatory Visit (HOSPITAL_BASED_OUTPATIENT_CLINIC_OR_DEPARTMENT_OTHER): Payer: BC Managed Care – PPO | Admitting: Pain Medicine

## 2020-05-31 ENCOUNTER — Encounter: Payer: Self-pay | Admitting: Pain Medicine

## 2020-05-31 ENCOUNTER — Other Ambulatory Visit: Payer: Self-pay

## 2020-05-31 ENCOUNTER — Ambulatory Visit
Admission: RE | Admit: 2020-05-31 | Discharge: 2020-05-31 | Disposition: A | Discharge status: Home / Self Care | Payer: BC Managed Care – PPO | Source: Ambulatory Visit | Attending: Pain Medicine | Admitting: Pain Medicine

## 2020-05-31 VITALS — BP 134/83 | HR 86 | Temp 96.7°F | Resp 11 | Ht 65.0 in | Wt 200.0 lb

## 2020-05-31 DIAGNOSIS — G8929 Other chronic pain: Secondary | ICD-10-CM | POA: Diagnosis present

## 2020-05-31 DIAGNOSIS — M5137 Other intervertebral disc degeneration, lumbosacral region: Secondary | ICD-10-CM

## 2020-05-31 DIAGNOSIS — M545 Low back pain, unspecified: Secondary | ICD-10-CM | POA: Insufficient documentation

## 2020-05-31 DIAGNOSIS — M47817 Spondylosis without myelopathy or radiculopathy, lumbosacral region: Secondary | ICD-10-CM | POA: Insufficient documentation

## 2020-05-31 DIAGNOSIS — M47816 Spondylosis without myelopathy or radiculopathy, lumbar region: Secondary | ICD-10-CM | POA: Insufficient documentation

## 2020-05-31 MED ORDER — MIDAZOLAM HCL 5 MG/5ML IJ SOLN
INTRAMUSCULAR | Status: AC
  Filled 2020-05-31: qty 5

## 2020-05-31 MED ORDER — TRIAMCINOLONE ACETONIDE 40 MG/ML IJ SUSP
80.0000 mg | Freq: Once | INTRAMUSCULAR | Status: AC
  Administered 2020-05-31: 80 mg

## 2020-05-31 MED ORDER — TRIAMCINOLONE ACETONIDE 40 MG/ML IJ SUSP
INTRAMUSCULAR | Status: AC
  Filled 2020-05-31: qty 2

## 2020-05-31 MED ORDER — LIDOCAINE HCL 2 % IJ SOLN
INTRAMUSCULAR | Status: AC
  Filled 2020-05-31: qty 20

## 2020-05-31 MED ORDER — ROPIVACAINE HCL 2 MG/ML IJ SOLN
18.0000 mL | Freq: Once | INTRAMUSCULAR | Status: AC
Start: 2020-05-31 — End: 2020-05-31
  Administered 2020-05-31: 9 mL via PERINEURAL

## 2020-05-31 MED ORDER — MIDAZOLAM HCL 5 MG/5ML IJ SOLN
1.0000 mg | INTRAMUSCULAR | Status: DC | PRN
  Administered 2020-05-31: 2 mg via INTRAVENOUS

## 2020-05-31 MED ORDER — FENTANYL CITRATE (PF) 100 MCG/2ML IJ SOLN
25.0000 ug | INTRAMUSCULAR | Status: DC | PRN
Start: 2020-05-31 — End: 2020-05-31
  Administered 2020-05-31: 50 ug via INTRAVENOUS

## 2020-05-31 MED ORDER — ROPIVACAINE HCL 2 MG/ML IJ SOLN
INTRAMUSCULAR | Status: AC
  Filled 2020-05-31: qty 20

## 2020-05-31 MED ORDER — LIDOCAINE HCL 2 % IJ SOLN
20.0000 mL | Freq: Once | INTRAMUSCULAR | Status: AC
Start: 2020-05-31 — End: 2020-05-31
  Administered 2020-05-31: 400 mg

## 2020-05-31 MED ORDER — FENTANYL CITRATE (PF) 100 MCG/2ML IJ SOLN
INTRAMUSCULAR | Status: AC
  Filled 2020-05-31: qty 2

## 2020-05-31 MED ORDER — LACTATED RINGERS IV SOLN
1000.0000 mL | Freq: Once | INTRAVENOUS | Status: AC
  Administered 2020-05-31: 1000 mL via INTRAVENOUS

## 2020-05-31 NOTE — Progress Notes (Signed)
Safety precautions to be maintained throughout the outpatient stay will include: orient to surroundings, keep bed in low position, maintain call bell within reach at all times, provide assistance with transfer out of bed and ambulation.  

## 2020-05-31 NOTE — Patient Instructions (Signed)

## 2020-05-31 NOTE — Progress Notes (Signed)
PROVIDER NOTE: Information contained herein reflects review and annotations entered in association with encounter. Interpretation of such information and data should be left to medically-trained personnel. Information provided to patient can be located elsewhere in the medical record under "Patient Instructions". Document created using STT-dictation technology, any transcriptional errors that may result from process are unintentional.    Patient: Jade Harris  Service Category: Procedure  Provider: Gaspar Cola, MD  DOB: [birth year]  DOS: 05/31/2020  Location: [facility]  MRN: [id number]  Setting: Ambulatory - outpatient  Referring Provider: Glean Hess, MD  Type: Established Patient  Specialty: Interventional Pain Management  PCP: Glean Hess, MD   Primary Reason for Visit: Interventional Pain Management Treatment. CC: Back Pain  Procedure:          Anesthesia, Analgesia, Anxiolysis:  Type: Lumbar Facet, Medial Branch Block(s) #2  Primary Purpose: Diagnostic Region: Posterolateral Lumbosacral Spine Level: L2, L3, L4, L5, & S1 Medial Branch Level(s). Injecting these levels blocks the L3-4, L4-5, and L5-S1 lumbar facet joints. Laterality: Bilateral  Type: Moderate (Conscious) Sedation combined with Local Anesthesia Indication(s): Analgesia and Anxiety Route: Intravenous (IV) IV Access: Secured Sedation: Meaningful verbal contact was maintained at all times during the procedure  Local Anesthetic: Lidocaine 1-2%  Position: Prone   Indications: 1. Lumbosacral facet syndrome (Bilateral) (R>L)   2. Lumbar facet hypertrophy   3. Spondylosis without myelopathy or radiculopathy, lumbosacral region   4. Chronic low back pain (1ry area of Pain) (Bilateral) (R>L) w/o sciatica   5. DDD (degenerative disc disease), lumbosacral   6. Lumbosacral facet joint syndrome   7. Facet hypertrophy of lumbar region   8. Chronic bilateral low back pain without sciatica     Pain Score: Pre-procedure: 8 /10 Post-procedure: 2 /10   Pre-op H&P Assessment:  [name] is a 55 y.o. (year old), female patient, seen today for interventional treatment. She  has a past surgical history that includes Nasal sinus surgery (04/2014); Tonsillectomy; Appendectomy; Total abdominal hysterectomy w/ bilateral salpingoophorectomy; Mouth surgery; and Abdominal hysterectomy. [name] has a current medication list which includes the following prescription(s): acetaminophen, b complex vitamins, calcipotriene-betameth diprop, diphenhydramine, duloxetine, estradiol, fluticasone, ibuprofen, multiple vitamins-minerals, and triamterene-hydrochlorothiazide, and the following Facility-Administered Medications: fentanyl and midazolam. Her primarily concern today is the Back Pain  Initial Vital Signs:  Pulse/HCG Rate: 86ECG Heart Rate: 86 Temp: (!) 97.3 F (36.3 C) Resp: 16 BP: (!) 134/92 SpO2: 99 %  BMI: Estimated body mass index is 33.28 kg/m as calculated from the following:   Height as of this encounter: 5\' 5"  (1.651 m).   Weight as of this encounter: 200 lb (90.7 kg).  Risk Assessment: Allergies: Reviewed. She is allergic to sulfa antibiotics.  Allergy Precautions: None required Coagulopathies: Reviewed. None identified.  Blood-thinner therapy: None at this time Active Infection(s): Reviewed. None identified. Ms. Bonds is afebrile  Site Confirmation: Ms. Denise was asked to confirm the procedure and laterality before marking the site Procedure checklist: Completed Consent: Before the procedure and under the influence of no sedative(s), amnesic(s), or anxiolytics, the patient was informed of the treatment options, risks and possible complications. To fulfill our ethical and legal obligations, as recommended by the American Medical Association's Code of Ethics, I have informed the patient of my clinical impression; the nature and purpose of the treatment or procedure; the  risks, benefits, and possible complications of the intervention; the alternatives, including doing nothing; the risk(s) and benefit(s) of the alternative treatment(s) or procedure(s); and  the risk(s) and benefit(s) of doing nothing. The patient was provided information about the general risks and possible complications associated with the procedure. These may include, but are not limited to: failure to achieve desired goals, infection, bleeding, organ or nerve damage, allergic reactions, paralysis, and death. In addition, the patient was informed of those risks and complications associated to Spine-related procedures, such as failure to decrease pain; infection (i.e.: Meningitis, epidural or intraspinal abscess); bleeding (i.e.: epidural hematoma, subarachnoid hemorrhage, or any other type of intraspinal or peri-dural bleeding); organ or nerve damage (i.e.: Any type of peripheral nerve, nerve root, or spinal cord injury) with subsequent damage to sensory, motor, and/or autonomic systems, resulting in permanent pain, numbness, and/or weakness of one or several areas of the body; allergic reactions; (i.e.: anaphylactic reaction); and/or death. Furthermore, the patient was informed of those risks and complications associated with the medications. These include, but are not limited to: allergic reactions (i.e.: anaphylactic or anaphylactoid reaction(s)); adrenal axis suppression; blood sugar elevation that in diabetics may result in ketoacidosis or comma; water retention that in patients with history of congestive heart failure may result in shortness of breath, pulmonary edema, and decompensation with resultant heart failure; weight gain; swelling or edema; medication-induced neural toxicity; particulate matter embolism and blood vessel occlusion with resultant organ, and/or nervous system infarction; and/or aseptic necrosis of one or more joints. Finally, the patient was informed that Medicine is not an exact  science; therefore, there is also the possibility of unforeseen or unpredictable risks and/or possible complications that may result in a catastrophic outcome. The patient indicated having understood very clearly. We have given the patient no guarantees and we have made no promises. Enough time was given to the patient to ask questions, all of which were answered to the patient's satisfaction. Ms. Braniff has indicated that she wanted to continue with the procedure. Attestation: I, the ordering provider, attest that I have discussed with the patient the benefits, risks, side-effects, alternatives, likelihood of achieving goals, and potential problems during recovery for the procedure that I have provided informed consent. Date  Time: 05/31/2020  7:52 AM  Pre-Procedure Preparation:  Monitoring: As per clinic protocol. Respiration, ETCO2, SpO2, BP, heart rate and rhythm monitor placed and checked for adequate function Safety Precautions: Patient was assessed for positional comfort and pressure points before starting the procedure. Time-out: I initiated and conducted the "Time-out" before starting the procedure, as per protocol. The patient was asked to participate by confirming the accuracy of the "Time Out" information. Verification of the correct person, site, and procedure were performed and confirmed by me, the nursing staff, and the patient. "Time-out" conducted as per Joint Commission's Universal Protocol (UP.01.01.01). Time: 0826  Description of Procedure:          Laterality: Bilateral. The procedure was performed in identical fashion on both sides. Levels:  L2, L3, L4, L5, & S1 Medial Branch Level(s) Area Prepped: Posterior Lumbosacral Region DuraPrep (Iodine Povacrylex [0.7% available iodine] and Isopropyl Alcohol, 74% w/w) Safety Precautions: Aspiration looking for blood return was conducted prior to all injections. At no point did we inject any substances, as a needle was being advanced.  Before injecting, the patient was told to immediately notify me if she was experiencing any new onset of "ringing in the ears, or metallic taste in the mouth". No attempts were made at seeking any paresthesias. Safe injection practices and needle disposal techniques used. Medications properly checked for expiration dates. SDV (single dose vial) medications used. After the  completion of the procedure, all disposable equipment used was discarded in the proper designated medical waste containers. Local Anesthesia: Protocol guidelines were followed. The patient was positioned over the fluoroscopy table. The area was prepped in the usual manner. The time-out was completed. The target area was identified using fluoroscopy. A 12-in long, straight, sterile hemostat was used with fluoroscopic guidance to locate the targets for each level blocked. Once located, the skin was marked with an approved surgical skin marker. Once all sites were marked, the skin (epidermis, dermis, and hypodermis), as well as deeper tissues (fat, connective tissue and muscle) were infiltrated with a small amount of a short-acting local anesthetic, loaded on a 10cc syringe with a 25G, 1.5-in  Needle. An appropriate amount of time was allowed for local anesthetics to take effect before proceeding to the next step. Local Anesthetic: Lidocaine 2.0% The unused portion of the local anesthetic was discarded in the proper designated containers. Technical explanation of process:  L2 Medial Branch Nerve Block (MBB): The target area for the L2 medial branch is at the junction of the postero-lateral aspect of the superior articular process and the superior, posterior, and medial edge of the transverse process of L3. Under fluoroscopic guidance, a Quincke needle was inserted until contact was made with os over the superior postero-lateral aspect of the pedicular shadow (target area). After negative aspiration for blood, 0.5 mL of the nerve block solution was  injected without difficulty or complication. The needle was removed intact. L3 Medial Branch Nerve Block (MBB): The target area for the L3 medial branch is at the junction of the postero-lateral aspect of the superior articular process and the superior, posterior, and medial edge of the transverse process of L4. Under fluoroscopic guidance, a Quincke needle was inserted until contact was made with os over the superior postero-lateral aspect of the pedicular shadow (target area). After negative aspiration for blood, 0.5 mL of the nerve block solution was injected without difficulty or complication. The needle was removed intact. L4 Medial Branch Nerve Block (MBB): The target area for the L4 medial branch is at the junction of the postero-lateral aspect of the superior articular process and the superior, posterior, and medial edge of the transverse process of L5. Under fluoroscopic guidance, a Quincke needle was inserted until contact was made with os over the superior postero-lateral aspect of the pedicular shadow (target area). After negative aspiration for blood, 0.5 mL of the nerve block solution was injected without difficulty or complication. The needle was removed intact. L5 Medial Branch Nerve Block (MBB): The target area for the L5 medial branch is at the junction of the postero-lateral aspect of the superior articular process and the superior, posterior, and medial edge of the sacral ala. Under fluoroscopic guidance, a Quincke needle was inserted until contact was made with os over the superior postero-lateral aspect of the pedicular shadow (target area). After negative aspiration for blood, 0.5 mL of the nerve block solution was injected without difficulty or complication. The needle was removed intact. S1 Medial Branch Nerve Block (MBB): The target area for the S1 medial branch is at the posterior and inferior 6 o'clock position of the L5-S1 facet joint. Under fluoroscopic guidance, the Quincke needle  inserted for the L5 MBB was redirected until contact was made with os over the inferior and postero aspect of the sacrum, at the 6 o' clock position under the L5-S1 facet joint (Target area). After negative aspiration for blood, 0.5 mL of the nerve block solution was  injected without difficulty or complication. The needle was removed intact.  Nerve block solution: 0.2% PF-Ropivacaine + Triamcinolone (40 mg/mL) diluted to a final concentration of 4 mg of Triamcinolone/mL of Ropivacaine The unused portion of the solution was discarded in the proper designated containers. Procedural Needles: 22-gauge, 5-inch, Quincke needles used for all levels.  Once the entire procedure was completed, the treated area was cleaned, making sure to leave some of the prepping solution back to take advantage of its long term bactericidal properties.      Illustration of the posterior view of the lumbar spine and the posterior neural structures. Laminae of L2 through S1 are labeled. DPRL5, dorsal primary ramus of L5; DPRS1, dorsal primary ramus of S1; DPR3, dorsal primary ramus of L3; FJ, facet (zygapophyseal) joint L3-L4; I, inferior articular process of L4; LB1, lateral branch of dorsal primary ramus of L1; IAB, inferior articular branches from L3 medial branch (supplies L4-L5 facet joint); IBP, intermediate branch plexus; MB3, medial branch of dorsal primary ramus of L3; NR3, third lumbar nerve root; S, superior articular process of L5; SAB, superior articular branches from L4 (supplies L4-5 facet joint also); TP3, transverse process of L3.  Vitals:   05/31/20 0835 05/31/20 0841 05/31/20 0851 05/31/20 0901  BP: (!) 131/102 122/87 134/83 134/83  Pulse:      Resp: 14 14 (!) 8 11  Temp:  (!) 97.5 F (36.4 C)  (!) 96.7 F (35.9 C)  TempSrc:  Temporal  Temporal  SpO2: 98% 97% 97% 100%  Weight:      Height:         Start Time: 0826 hrs. End Time: 0835 hrs.  Imaging Guidance (Spinal):          Type of Imaging  Technique: Fluoroscopy Guidance (Spinal) Indication(s): Assistance in needle guidance and placement for procedures requiring needle placement in or near specific anatomical locations not easily accessible without such assistance. Exposure Time: Please see nurses notes. Contrast: None used. Fluoroscopic Guidance: I was personally present during the use of fluoroscopy. "Tunnel Vision Technique" used to obtain the best possible view of the target area. Parallax error corrected before commencing the procedure. "Direction-depth-direction" technique used to introduce the needle under continuous pulsed fluoroscopy. Once target was reached, antero-posterior, oblique, and lateral fluoroscopic projection used confirm needle placement in all planes. Images permanently stored in EMR. Interpretation: No contrast injected. I personally interpreted the imaging intraoperatively. Adequate needle placement confirmed in multiple planes. Permanent images saved into the patient's record.  Antibiotic Prophylaxis:   Anti-infectives (From admission, onward)   None     Indication(s): None identified  Post-operative Assessment:  Post-procedure Vital Signs:  Pulse/HCG Rate: 8675 Temp: (!) 96.7 F (35.9 C) Resp: 11 BP: 134/83 SpO2: 100 %  EBL: None  Complications: No immediate post-treatment complications observed by team, or reported by patient.  Note: The patient tolerated the entire procedure well. A repeat set of vitals were taken after the procedure and the patient was kept under observation following institutional policy, for this type of procedure. Post-procedural neurological assessment was performed, showing return to baseline, prior to discharge. The patient was provided with post-procedure discharge instructions, including a section on how to identify potential problems. Should any problems arise concerning this procedure, the patient was given instructions to immediately contact us, at any time, without  hesitation. In any case, we plan to contact the patient by telephone for a follow-up status report regarding this interventional procedure.  Comments:  No additional relevant information.  Plan  of Care  Orders:  Orders Placed This Encounter  Procedures  . LUMBAR FACET(MEDIAL BRANCH NERVE BLOCK) MBNB    Scheduling Instructions:     Procedure: Lumbar facet block (AKA.: Lumbosacral medial branch nerve block)     Side: Bilateral     Level: L3-4, L4-5, & L5-S1 Facets (L2, L3, L4, L5, & S1 Medial Branch Nerves)     Sedation: Patient's choice.     Timeframe: Today    Order Specific Question:   Where will this procedure be performed?    Answer:   [facility]  . [facility] C-ARM 1-60 MIN NO REPORT    Intraoperative interpretation by procedural physician at [place].    Standing Status:   Standing    Number of Occurrences:   1    Order Specific Question:   Reason for exam:    Answer:   Assistance in needle guidance and placement for procedures requiring needle placement in or near specific anatomical locations not easily accessible without such assistance.  . Informed Consent Details: Physician/Practitioner Attestation; Transcribe to consent form and obtain patient signature    Nursing Order: Transcribe to consent form and obtain patient signature. Note: Always confirm laterality of pain with Ms. Bulnes, before procedure.    Order Specific Question:   Physician/Practitioner attestation of informed consent for procedure/surgical case    Answer:   I, the physician/practitioner, attest that I have discussed with the patient the benefits, risks, side effects, alternatives, likelihood of achieving goals and potential problems during recovery for the procedure that I have provided informed consent.    Order Specific Question:   Procedure    Answer:   Lumbar Facet Block  under fluoroscopic guidance    Order Specific Question:   Physician/Practitioner performing the procedure     Answer:   Doranne Schmutz A. Dossie Arbour MD    Order Specific Question:   Indication/Reason    Answer:   Low Back Pain, with our without leg pain, due to Facet Joint Arthralgia (Joint Pain) Spondylosis (Arthritis of the Spine), without myelopathy or radiculopathy (Nerve Damage).  . Provide equipment / supplies at bedside    "Block Tray" (Disposable  single use) Needle type: SpinalSpinal Amount/quantity: 4 Size: Regular (5-inch) Gauge: 22G    Standing Status:   Standing    Number of Occurrences:   1    Order Specific Question:   Specify    Answer:   Block Tray   Chronic Opioid Analgesic:  No opioid analgesics prescribed by our practice. MME/day: 0 mg/day   Medications ordered for procedure: Meds ordered this encounter  Medications  . lidocaine (XYLOCAINE) 2 % (with pres) injection 400 mg  . lactated ringers infusion 1,000 mL  . midazolam (VERSED) 5 MG/5ML injection 1-2 mg    Make sure Flumazenil is available in the pyxis when using this medication. If oversedation occurs, administer 0.2 mg IV over 15 sec. If after 45 sec no response, administer 0.2 mg again over 1 min; may repeat at 1 min intervals; not to exceed 4 doses (1 mg)  . fentaNYL (SUBLIMAZE) injection 25-50 mcg    Make sure Narcan is available in the pyxis when using this medication. In the event of respiratory depression (RR< 8/min): Titrate NARCAN (naloxone) in increments of 0.1 to 0.2 mg IV at 2-3 minute intervals, until desired degree of reversal.  . ropivacaine (PF) 2 mg/mL (0.2%) (NAROPIN) injection 18 mL  . triamcinolone acetonide (KENALOG-40) injection 80 mg   Medications administered:  We administered lidocaine, lactated ringers, midazolam, fentaNYL, ropivacaine (PF) 2 mg/mL (0.2%), and triamcinolone acetonide.  See the medical record for exact dosing, route, and time of administration.  Follow-up plan:   Return in about 2 weeks (around 06/14/2020) for (afternoon VV), on procedure day, (MM).       Interventional  Therapies  Risk  Complexity Considerations:   NO OPIOIDS!!! (01/25/2020) Abn UDS (+) for carboxy-THC (marijuana)   Planned  Pending:   Therapeutic right SI joint RFA #1    Under consideration:   Diagnostic bilateral lumbar facet MBB #2  Possible bilateral lumbar facet RFA  Possible right SI joint RFA  Diagnostic bilateral cervical facet MBB #1  Possible bilateral cervical facet RFA  Diagnostic right caudal ESI + epidurogram    Completed:   Diagnostic bilateral lumbar facet MBB x1 (02/09/2020) (+S:50/50/50 x2 wks) Diagnostic right (inferior) SI joint block x1 (03/22/2020) (100/100/100 x2 wks/85)  Diagnostic right (superior) SI joint block x1 (03/22/2020) (100/100/90/90)   Therapeutic  Palliative (PRN) options:   None established     Recent Visits Date Type Provider Dept  05/12/20 Office Visit Milinda Pointer, MD [facility]  03/31/20 Telemedicine Milinda Pointer, MD [facility]  03/22/20 Procedure visit Milinda Pointer, MD [facility]  03/16/20 Office Visit Milinda Pointer, MD [facility]  Showing recent visits within past 90 days and meeting all other requirements Today's Visits Date Type Provider Dept  05/31/20 Procedure visit Milinda Pointer, MD [facility]  Showing today's visits and meeting all other requirements Future Appointments Date Type Provider Dept  06/14/20 Appointment Milinda Pointer, MD [facility]  Showing future appointments within next 90 days and meeting all other requirements  Disposition: Discharge home  Discharge (Date  Time): 05/31/2020; 0904 hrs.   Primary Care Physician: Glean Hess, MD Location: [facility] Note by: Gaspar Cola, MD Date: 05/31/2020; Time: 1:28 PM  Disclaimer:  Medicine is not an Chief Strategy Officer. The only guarantee in medicine is that nothing is guaranteed. It is important to note that the decision to proceed with  this intervention was based on the information collected from the patient. The Data and conclusions were drawn from the patient's questionnaire, the interview, and the physical examination. Because the information was provided in large part by the patient, it cannot be guaranteed that it has not been purposely or unconsciously manipulated. Every effort has been made to obtain as much relevant data as possible for this evaluation. It is important to note that the conclusions that lead to this procedure are derived in large part from the available data. Always take into account that the treatment will also be dependent on availability of resources and existing treatment guidelines, considered by other Pain Management Practitioners as being common knowledge and practice, at the time of the intervention. For Medico-Legal purposes, it is also important to point out that variation in procedural techniques and pharmacological choices are the acceptable norm. The indications, contraindications, technique, and results of the above procedure should only be interpreted and judged by a Board-Certified Interventional Pain Specialist with extensive familiarity and expertise in the same exact procedure and technique.

## 2020-06-01 ENCOUNTER — Telehealth: Payer: Self-pay

## 2020-06-01 NOTE — Telephone Encounter (Signed)
Post procedure phone call. Patient states she is doing good.  

## 2020-06-13 ENCOUNTER — Encounter: Payer: Self-pay | Admitting: Pain Medicine

## 2020-06-14 ENCOUNTER — Telehealth: Payer: Self-pay

## 2020-06-14 ENCOUNTER — Other Ambulatory Visit: Payer: Self-pay

## 2020-06-14 ENCOUNTER — Ambulatory Visit: Payer: BC Managed Care – PPO | Attending: Pain Medicine | Admitting: Pain Medicine

## 2020-06-14 DIAGNOSIS — M545 Low back pain, unspecified: Secondary | ICD-10-CM | POA: Diagnosis not present

## 2020-06-14 DIAGNOSIS — M47817 Spondylosis without myelopathy or radiculopathy, lumbosacral region: Secondary | ICD-10-CM

## 2020-06-14 DIAGNOSIS — M533 Sacrococcygeal disorders, not elsewhere classified: Secondary | ICD-10-CM | POA: Diagnosis not present

## 2020-06-14 DIAGNOSIS — G894 Chronic pain syndrome: Secondary | ICD-10-CM

## 2020-06-14 DIAGNOSIS — R1031 Right lower quadrant pain: Secondary | ICD-10-CM

## 2020-06-14 DIAGNOSIS — M47816 Spondylosis without myelopathy or radiculopathy, lumbar region: Secondary | ICD-10-CM | POA: Diagnosis not present

## 2020-06-14 DIAGNOSIS — G8929 Other chronic pain: Secondary | ICD-10-CM

## 2020-06-14 DIAGNOSIS — Z20822 Contact with and (suspected) exposure to covid-19: Secondary | ICD-10-CM

## 2020-06-14 NOTE — Telephone Encounter (Unsigned)
Copied from [employer] [phone]. Topic: General - Other >> Jun 14, 2020 10:41 AM Leward Quan A wrote: Reason for CRM: Patient called in to inform Dr Army Melia that she tested positive for Covid on a home test but need a PCR test for her employer asking if this can be done at the office. Please advise Ph# [phone]

## 2020-06-14 NOTE — Progress Notes (Signed)
Patient: Jade Harris  Service Category: E/M  Provider: Gaspar Cola, MD  DOB: 07/21/1965  DOS: 06/14/2020  Location: Office  MRN: [id number]  Setting: Ambulatory outpatient  Referring Provider: Glean Hess, MD  Type: Established Patient  Specialty: Interventional Pain Management  PCP: Glean Hess, MD  Location: Remote location  Delivery: TeleHealth     Virtual Encounter - Pain Management PROVIDER NOTE: Information contained herein reflects review and annotations entered in association with encounter. Interpretation of such information and data should be left to medically-trained personnel. Information provided to patient can be located elsewhere in the medical record under "Patient Instructions". Document created using STT-dictation technology, any transcriptional errors that may result from process are unintentional.    Contact & Pharmacy Preferred: [phone] Home: [phone] (home) Mobile: [phone] (mobile) E-mail: [email or web address]  Jade Harris, [place] Jade Harris Phone: [phone] Fax: [phone]   Pre-screening  Jade Harris offered "in-person" vs "virtual" encounter. She indicated preferring virtual for this encounter.   Reason COVID-19*  Social distancing based on CDC and AMA recommendations.   I contacted Jade Harris on 06/14/2020 via telephone.      I clearly identified myself as Jade Cola, MD. I verified that I was speaking with the correct person using two identifiers (Name: Jade Harris, and date of birth: Feb 25, 1965).  Consent I sought verbal advanced consent from Jade Harris for virtual visit interactions. I informed Jade Harris of possible security and privacy concerns, risks, and limitations associated with providing "not-in-person" medical evaluation and management services. I also informed Jade Harris  of the availability of "in-person" appointments. Finally, I informed her that there would be a charge for the virtual visit and that she could be  personally, fully or partially, financially responsible for it. Jade Harris expressed understanding and agreed to proceed.   Historic Elements   Jade Harris is a 55 y.o. year old, female patient evaluated today after our last contact on 05/31/2020. Jade Harris  has a past medical history of Cancer ([place]). She also  has a past surgical history that includes Nasal sinus surgery (04/2014); Tonsillectomy; Appendectomy; Total abdominal hysterectomy w/ bilateral salpingoophorectomy; Mouth surgery; and Abdominal hysterectomy. Jade Harris has a current medication list which includes the following prescription(s): acetaminophen, b complex vitamins, calcipotriene-betameth diprop, diphenhydramine, duloxetine, estradiol, fluticasone, ibuprofen, multiple vitamins-minerals, and triamterene-hydrochlorothiazide. She  reports that she quit smoking about 33 years ago. She has never used smokeless tobacco. She reports current alcohol use. She reports that she does not use drugs. Jade Harris is allergic to sulfa antibiotics.   HPI  Today, she is being contacted for a post-procedure assessment.  The patient has attained 100% relief of the pain for the duration of the local anesthetic and on this particular second diagnostic injection the same 100% relief has persisted for over 2 weeks.  Today she refers that this past Sunday some of the discomfort started coming back on the right side.  The right side has always been her worst side.  The patient has already attempted and failed physical therapy by [facility], has diagnostic imaging done on 01/28/2020 with clear evidence of Lumbar facet hypertrophy, has had positive physical exam findings on hyperextension and rotation as well as Jade Harris maneuver demonstrating bilateral lumbar facet arthralgia upon performing the maneuvers.  And  the patient has also had 2 diagnostic lumbar facet  blocks with 100% relief of the pain for the duration of the local anesthetic and on this second diagnostic injection she has attained 100% relief of the pain for an extended period past the duration of those local anesthetics.  At this point, it is my medical opinion as a board-certified interventional pain management specialist, examining and treating physician, that this patient should undergo bilateral lumbar facet radiofrequency ablation for the purpose of extending on the benefits already demonstrated by the diagnostic injections.  I believe it to be medically necessary for her to have this treatment done, as soon as possible.  The plan was shared with the patient along with the risk and possible complications which she understood and accepted.  Post-Procedure Evaluation  Procedure (05/31/2020): Diagnostic bilateral lumbar facet MBB #2 under fluoroscopic guidance and IV sedation Pre-procedure pain level: 8/10 Post-procedure: 2/10 (> 50% relief)  Sedation: Sedation provided.  Effectiveness during initial hour after procedure(Ultra-Short Term Relief): 100 %.  Local anesthetic used: Long-acting (4-6 hours) Effectiveness: Defined as any analgesic benefit obtained secondary to the administration of local anesthetics. This carries significant diagnostic value as to the etiological location, or anatomical origin, of the pain. Duration of benefit is expected to coincide with the duration of the local anesthetic used.  Effectiveness during initial 4-6 hours after procedure(Short-Term Relief): 100 %.  Long-term benefit: Defined as any relief past the pharmacologic duration of the local anesthetics.  Effectiveness past the initial 6 hours after procedure(Long-Term Relief): 100 %.  Current benefits: Defined as benefit that persist at this time.   Analgesia:  The patient is currently experiencing an ongoing 100% relief of her low back pain. Function: [name] reports improvement in function ROM: Jade Harris reports improvement in ROM  Statement of Medical Necessity:  Ms. Reichertcontinues to experienced debilitating chronic nerve-associated pain from the Lumbosacral Facet Syndrome (Spondylosis without myelopathy or radiculopathy, lumbosacral region [M47.817]).  Duration: This pain has persisted for longer than three months.  Non-surgical care: The patient has either failed to respond, or was unable to tolerate, or simply did not get enough benefit from other more conservative therapies including, but not limited to: 1. Over-the-counter oral analgesic medications (i.e.: ibuprofen, naproxen, etc.) 2. Anti-inflammatory medications 3. Muscle relaxants 4. Membrane stabilizers 5. Opioids 6. Physical therapy (PT), chiropractic manipulation, and/or home exercise program (HEP). 7. Modalities (Heat, ice, etc.)  Invasive therapies: Nerve blocks have failed to provide any significant long-term benefit.  Surgical care: Not indicated.  Physical exam: Has been consistent with Lumbosacral spondylosis with radiculopathy.  Diagnostic imaging: Lumbosacral Facet Hypertrophy.                Diagnostic interventional therapies: Jade Harris has attained greater than 50% reduction in pain from at least two (2) diagnostic medial branch blocks conducted in separate occasions.   For the above listed reason, I believe, as the examining and treating physician, that it is medically necessary to proceed with Non-Pulsed Radiofrequency Ablation for the purpose of attempting to prolong the duration of the benefits seen with the diagnostic injections.  Pharmacotherapy Assessment  Analgesic: No opioid analgesics prescribed by our practice. MME/day: 0 mg/day   Monitoring: [place] PMP: PDMP reviewed during this encounter.       Pharmacotherapy: No side-effects or adverse reactions reported. Compliance: No problems identified. Effectiveness: Clinically  acceptable. Plan: Refer to "POC".  UDS:  Summary  Date Value Ref Range Status  01/25/2020 Note  Final    Comment:    ==================================================================== Compliance Drug Analysis, Ur ====================================================================  Test                             Result       Flag       Units  Drug Present and Declared for Prescription Verification   Duloxetine                     PRESENT      EXPECTED  Drug Present not Declared for Prescription Verification   Carboxy-THC                    272          UNEXPECTED ng/mg creat    Carboxy-THC is a metabolite of tetrahydrocannabinol (THC). Source of    THC is most commonly herbal marijuana or marijuana-based products,    but THC is also present in a scheduled prescription medication.    Trace amounts of THC can be present in hemp and cannabidiol (CBD)    products. This test is not intended to distinguish between delta-9-    tetrahydrocannabinol, the predominant form of THC in most herbal or    marijuana-based products, and delta-8-tetrahydrocannabinol.    Methocarbamol                  PRESENT      UNEXPECTED   Guaifenesin                    PRESENT      UNEXPECTED    Guaifenesin may be administered as an over-the-counter or    prescription drug; it may also be present as a breakdown product of    methocarbamol.    Acetaminophen                  PRESENT      UNEXPECTED   Doxylamine                     PRESENT      UNEXPECTED   Dextromethorphan               PRESENT      UNEXPECTED   Dextrorphan/Levorphanol        PRESENT      UNEXPECTED    Dextrorphan is an expected metabolite of dextromethorphan, an over-    the-counter or prescription cough suppressant. Dextrorphan cannot be    distinguished from the scheduled prescription medication levorphanol    by the method used for analysis.  Drug Absent but Declared for Prescription Verification   Ibuprofen                      Not  Detected UNEXPECTED    Ibuprofen, as indicated in the declared medication list, is not    always detected even when used as directed.  ==================================================================== Test                      Result    Flag   Units      Ref Range   Creatinine              149              mg/dL      >=20 ==================================================================== Declared Medications:  The flagging and interpretation on this report are based on the  following declared medications.  Unexpected results may arise from  inaccuracies  in the declared medications.   **Note: The testing scope of this panel includes these medications:   Duloxetine   **Note: The testing scope of this panel does not include small to  moderate amounts of these reported medications:   Ibuprofen   **Note: The testing scope of this panel does not include the  following reported medications:   Cholecalciferol  Estradiol  Fluticasone  Hydrochlorothiazide  Multivitamin  Topical  Triamterene  Vitamin B ==================================================================== For clinical consultation, please call [phone]. ====================================================================     Laboratory Chemistry Profile   Renal Lab Results  Component Value Date   BUN 14 01/25/2020   CREATININE 0.67 01/25/2020   BCR 21 01/25/2020   GFRAA 114 01/25/2020   GFRNONAA 99 01/25/2020     Hepatic Lab Results  Component Value Date   AST 16 01/25/2020   ALT 12 11/07/2018   ALBUMIN 4.4 01/25/2020   ALKPHOS 64 01/25/2020     Electrolytes Lab Results  Component Value Date   NA 141 01/25/2020   K 4.3 01/25/2020   CL 104 01/25/2020   CALCIUM 9.5 01/25/2020   MG 2.0 01/25/2020     Bone Lab Results  Component Value Date   25OHVITD1 29 (L) 01/25/2020   25OHVITD2 <1.0 01/25/2020   25OHVITD3 29 01/25/2020     Inflammation (CRP: Acute Phase) (ESR: Chronic  Phase) Lab Results  Component Value Date   CRP 3 01/25/2020   ESRSEDRATE 21 01/25/2020       Note: Above Lab results reviewed.  Imaging  DG PAIN CLINIC C-ARM 1-60 MIN NO REPORT Fluoro was used, but no Radiologist interpretation will be provided.  Please refer to "NOTES" tab for provider progress note.  Assessment  The primary encounter diagnosis was Lumbosacral facet syndrome (Bilateral) (R>L). Diagnoses of Lumbar facet hypertrophy, Chronic low back pain (1ry area of Pain) (Bilateral) (R>L) w/o sciatica, Chronic sacroiliac joint pain (Right), Chronic groin pain (Intermittent) (Right), and Chronic pain syndrome were also pertinent to this visit.  Plan of Care  Problem-specific:  No problem-specific Assessment & Plan notes found for this encounter.  Ms. IVI GRIFFITH has a current medication list which includes the following long-term medication(s): duloxetine, estradiol, and fluticasone.  Pharmacotherapy (Medications Ordered): No orders of the defined types were placed in this encounter.  Orders:  Orders Placed This Encounter  Procedures  . Radiofrequency,Lumbar    Standing Status:   Future    Standing Expiration Date:   06/14/2021    Scheduling Instructions:     Side(s): Right-sided     Level: L3-4, L4-5, & L5-S1 Facets (L2, L3, L4, L5, & S1 Medial Branch Nerves)     Sedation: With Sedation.     Scheduling Timeframe: As soon as pre-approved    Order Specific Question:   Where will this procedure be performed?    Answer:   [facility]   Follow-up plan:   Return for RFA (15mn): (R) L-FCT RFA #1.      Interventional Therapies  Risk  Complexity Considerations:   Estimated body mass index is 33.28 kg/m as calculated from the following:   Height as of 05/31/20: '5\' 5"'  (1.651 m).   Weight as of 05/31/20: 200 lb (90.7 kg). NO OPIOIDS!!! (01/25/2020) Abn UDS (+) for carboxy-THC (marijuana)   Planned  Pending:   Therapeutic right lumbar facet RFA #1    Under  consideration:   Therapeutic bilateral lumbar facet RFA #1 (starting with the right side and then doing the left 2  weeks later) Therapeutic right SI joint RFA #1  Diagnostic bilateral cervical facet MBB #1  Possible bilateral cervical facet RFA  Diagnostic right caudal ESI + epidurogram    Completed:   Diagnostic bilateral lumbar facet MBB x2 (05/31/2020) (2nd: 100/100/100) Diagnostic right (inferior) SI joint block x1 (03/22/2020) (100/100/100 x2 wks/85)  Diagnostic right (superior) SI joint block x1 (03/22/2020) (100/100/90/90)   Therapeutic  Palliative (PRN) options:   Therapeutic/palliative bilateral lumbar facet MBB #3     Recent Visits Date Type Provider Dept  05/31/20 Procedure visit Milinda Pointer, MD [facility]  05/12/20 Office Visit Milinda Pointer, MD [facility]  03/31/20 Telemedicine Milinda Pointer, MD [facility]  03/22/20 Procedure visit Milinda Pointer, MD [facility]  03/16/20 Office Visit Milinda Pointer, MD [facility]  Showing recent visits within past 90 days and meeting all other requirements Today's Visits Date Type Provider Dept  06/14/20 Telemedicine Milinda Pointer, MD [facility]  Showing today's visits and meeting all other requirements Future Appointments No visits were found meeting these conditions. Showing future appointments within next 90 days and meeting all other requirements  I discussed the assessment and treatment plan with the patient. The patient was provided an opportunity to ask questions and all were answered. The patient agreed with the plan and demonstrated an understanding of the instructions.  Patient advised to call back or seek an in-person evaluation if the symptoms or condition worsens.  Duration of encounter: 15 minutes.  Note by: Jade Cola, MD Date: 06/14/2020; Time: 8:31 AM

## 2020-06-14 NOTE — Telephone Encounter (Signed)
Called patient. She is currently feeling "okay" at this time. Said yesterday she did not feel so good. 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  She will pull up outside at 130 this afternoon for PCR test. I told her I can swab her and then she can leave. She will receive her results in my chart within 48 hours typically.

## 2020-06-14 NOTE — Telephone Encounter (Signed)
[employer] Rep states that Procedure can not be authorized at this time due to a denial on 04/04/2020. Per policy you have to wait 180 before submiting again to seek Auth

## 2020-06-14 NOTE — Patient Instructions (Signed)
____________________________________________________________________________________________  Preparing for Procedure with Sedation  Procedure appointments are limited to planned procedures: . No Prescription Refills. . No disability issues will be discussed. . No medication changes will be discussed.  Instructions: . Oral Intake: Do not eat or drink anything for at least 8 hours prior to your procedure. (Exception: Blood Pressure Medication. See below.) . Transportation: Unless otherwise stated by your physician, you may drive yourself after the procedure. . Blood Pressure Medicine: Do not forget to take your blood pressure medicine with a sip of water the morning of the procedure. If your Diastolic (lower reading)is above 100 mmHg, elective cases will be cancelled/rescheduled. . Blood thinners: These will need to be stopped for procedures. Notify our staff if you are taking any blood thinners. Depending on which one you take, there will be specific instructions on how and when to stop it. . Diabetics on insulin: Notify the staff so that you can be scheduled 1st case in the morning. If your diabetes requires high dose insulin, take only  of your normal insulin dose the morning of the procedure and notify the staff that you have done so. . Preventing infections: Shower with an antibacterial soap the morning of your procedure. . Build-up your immune system: Take 1000 mg of Vitamin C with every meal (3 times a day) the day prior to your procedure. . Antibiotics: Inform the staff if you have a condition or reason that requires you to take antibiotics before dental procedures. . Pregnancy: If you are pregnant, call and cancel the procedure. . Sickness: If you have a cold, fever, or any active infections, call and cancel the procedure. . Arrival: You must be in the facility at least 30 minutes prior to your scheduled procedure. . Children: Do not bring children with you. . Dress appropriately:  Bring dark clothing that you would not mind if they get stained. . Valuables: Do not bring any jewelry or valuables.  Reasons to call and reschedule or cancel your procedure: (Following these recommendations will minimize the risk of a serious complication.) . Surgeries: Avoid having procedures within 2 weeks of any surgery. (Avoid for 2 weeks before or after any surgery). . Flu Shots: Avoid having procedures within 2 weeks of a flu shots or . (Avoid for 2 weeks before or after immunizations). . Barium: Avoid having a procedure within 7-10 days after having had a radiological study involving the use of radiological contrast. (Myelograms, Barium swallow or enema study). . Heart attacks: Avoid any elective procedures or surgeries for the initial 6 months after a "Myocardial Infarction" (Heart Attack). . Blood thinners: It is imperative that you stop these medications before procedures. Let us know if you if you take any blood thinner.  . Infection: Avoid procedures during or within two weeks of an infection (including chest colds or gastrointestinal problems). Symptoms associated with infections include: Localized redness, fever, chills, night sweats or profuse sweating, burning sensation when voiding, cough, congestion, stuffiness, runny nose, sore throat, diarrhea, nausea, vomiting, cold or Flu symptoms, recent or current infections. It is specially important if the infection is over the area that we intend to treat. . Heart and lung problems: Symptoms that may suggest an active cardiopulmonary problem include: cough, chest pain, breathing difficulties or shortness of breath, dizziness, ankle swelling, uncontrolled high or unusually low blood pressure, and/or palpitations. If you are experiencing any of these symptoms, cancel your procedure and contact your primary care physician for an evaluation.  Remember:  Regular Business hours are:    Monday to Thursday 8:00 AM to 4:00 PM  Provider's  Schedule: Kessler Kopinski, MD:  Procedure days: Tuesday and Thursday 7:30 AM to 4:00 PM  Bilal Lateef, MD:  Procedure days: Monday and Wednesday 7:30 AM to 4:00 PM ____________________________________________________________________________________________   ____________________________________________________________________________________________  General Risks and Possible Complications  Patient Responsibilities: It is important that you read this as it is part of your informed consent. It is our duty to inform you of the risks and possible complications associated with treatments offered to you. It is your responsibility as a patient to read this and to ask questions about anything that is not clear or that you believe was not covered in this document.  Patient's Rights: You have the right to refuse treatment. You also have the right to change your mind, even after initially having agreed to have the treatment done. However, under this last option, if you wait until the last second to change your mind, you may be charged for the materials used up to that point.  Introduction: Medicine is not an exact science. Everything in Medicine, including the lack of treatment(s), carries the potential for danger, harm, or loss (which is by definition: Risk). In Medicine, a complication is a secondary problem, condition, or disease that can aggravate an already existing one. All treatments carry the risk of possible complications. The fact that a side effects or complications occurs, does not imply that the treatment was conducted incorrectly. It must be clearly understood that these can happen even when everything is done following the highest safety standards.  No treatment: You can choose not to proceed with the proposed treatment alternative. The "PRO(s)" would include: avoiding the risk of complications associated with the therapy. The "CON(s)" would include: not getting any of the treatment  benefits. These benefits fall under one of three categories: diagnostic; therapeutic; and/or palliative. Diagnostic benefits include: getting information which can ultimately lead to improvement of the disease or symptom(s). Therapeutic benefits are those associated with the successful treatment of the disease. Finally, palliative benefits are those related to the decrease of the primary symptoms, without necessarily curing the condition (example: decreasing the pain from a flare-up of a chronic condition, such as incurable terminal cancer).  General Risks and Complications: These are associated to most interventional treatments. They can occur alone, or in combination. They fall under one of the following six (6) categories: no benefit or worsening of symptoms; bleeding; infection; nerve damage; allergic reactions; and/or death. 1. No benefits or worsening of symptoms: In Medicine there are no guarantees, only probabilities. No healthcare provider can ever guarantee that a medical treatment will work, they can only state the probability that it may. Furthermore, there is always the possibility that the condition may worsen, either directly, or indirectly, as a consequence of the treatment. 2. Bleeding: This is more common if the patient is taking a blood thinner, either prescription or over the counter (example: Goody Powders, Fish oil, Aspirin, Garlic, etc.), or if suffering a condition associated with impaired coagulation (example: Hemophilia, cirrhosis of the liver, low platelet counts, etc.). However, even if you do not have one on these, it can still happen. If you have any of these conditions, or take one of these drugs, make sure to notify your treating physician. 3. Infection: This is more common in patients with a compromised immune system, either due to disease (example: diabetes, cancer, human immunodeficiency virus [HIV], etc.), or due to medications or treatments (example: therapies used to treat  cancer and   rheumatological diseases). However, even if you do not have one on these, it can still happen. If you have any of these conditions, or take one of these drugs, make sure to notify your treating physician. 4. Nerve Damage: This is more common when the treatment is an invasive one, but it can also happen with the use of medications, such as those used in the treatment of cancer. The damage can occur to small secondary nerves, or to large primary ones, such as those in the spinal cord and brain. This damage may be temporary or permanent and it may lead to impairments that can range from temporary numbness to permanent paralysis and/or brain death. 5. Allergic Reactions: Any time a substance or material comes in contact with our body, there is the possibility of an allergic reaction. These can range from a mild skin rash (contact dermatitis) to a severe systemic reaction (anaphylactic reaction), which can result in death. 6. Death: In general, any medical intervention can result in death, most of the time due to an unforeseen complication. ____________________________________________________________________________________________   

## 2020-06-15 ENCOUNTER — Encounter: Payer: Self-pay | Admitting: Internal Medicine

## 2020-06-15 LAB — NOVEL CORONAVIRUS, NAA: SARS-CoV-2, NAA: NOT DETECTED

## 2020-06-15 LAB — SARS-COV-2, NAA 2 DAY TAT

## 2020-06-23 ENCOUNTER — Other Ambulatory Visit: Payer: Self-pay

## 2020-06-23 DIAGNOSIS — N3000 Acute cystitis without hematuria: Secondary | ICD-10-CM

## 2020-06-23 DIAGNOSIS — N3001 Acute cystitis with hematuria: Secondary | ICD-10-CM

## 2020-06-23 MED ORDER — CIPROFLOXACIN HCL 250 MG PO TABS: 250.0000 mg | ORAL_TABLET | Freq: Two times a day (BID) | ORAL | 0 refills | Status: DC

## 2020-06-24 ENCOUNTER — Other Ambulatory Visit: Payer: Self-pay | Admitting: Internal Medicine

## 2020-06-24 DIAGNOSIS — F324 Major depressive disorder, single episode, in partial remission: Secondary | ICD-10-CM

## 2020-06-24 NOTE — Telephone Encounter (Signed)
Courtesy refill. No valid encounter within 6 months. Called to schedule appt. No answer, LVMTCB.

## 2020-06-26 ENCOUNTER — Other Ambulatory Visit: Payer: Self-pay | Admitting: Internal Medicine

## 2020-06-26 DIAGNOSIS — M47816 Spondylosis without myelopathy or radiculopathy, lumbar region: Secondary | ICD-10-CM

## 2020-06-28 ENCOUNTER — Other Ambulatory Visit: Payer: Self-pay | Admitting: Internal Medicine

## 2020-06-28 NOTE — Telephone Encounter (Signed)
Notes to clinic:  review medications for continued use and refills Scripts have expired    Requested Prescriptions  Pending Prescriptions Disp Refills   methocarbamol (ROBAXIN) 500 MG tablet [Pharmacy Med Name: ZOXWRUEAVWUJW 500MG  TABLETS] 100 tablet 2    Sig: TAKE 1 TABLET(500 MG) BY MOUTH EVERY 6 HOURS AS NEEDED FOR MUSCLE SPASMS      Not Delegated - Analgesics:  Muscle Relaxants Failed - 06/26/2020  7:46 AM      Failed - This refill cannot be delegated      Failed - Valid encounter within last 6 months    Recent Outpatient Visits           6 months ago Post covid-19 condition, unspecified   [facility] Glean Hess, MD   7 months ago Pneumonia due to COVID-19 virus   [facility] Glean Hess, MD   7 months ago Pneumonia due to COVID-19 virus   [facility] Glean Hess, MD   8 months ago Encounter by telehealth for suspected COVID-19   [facility] Glean Hess, MD   10 months ago Spondylosis of lumbar region without myelopathy or radiculopathy   [facility] Glean Hess, MD                  fluticasone [facility]) 50 MCG/ACT nasal spray [Pharmacy Med Name: FLUTICASONE 50MCG NASAL SP (120) RX] 16 g 5    Sig: USE 2 SPRAYS IN EACH NOSTRIL DAILY      Ear, Nose, and Throat: Nasal Preparations - Corticosteroids Passed - 06/26/2020  7:46 AM      Passed - Valid encounter within last 12 months    Recent Outpatient Visits           6 months ago Post covid-19 condition, unspecified   [facility] Glean Hess, MD   7 months ago Pneumonia due to COVID-19 virus   [facility] Glean Hess, MD   7 months ago Pneumonia due to COVID-19 virus   [facility] Glean Hess, MD   8 months ago Encounter by telehealth for suspected COVID-19   [facility] Glean Hess, MD   10 months ago Spondylosis of lumbar region without myelopathy or radiculopathy    [facility] Glean Hess, MD                  triamterene-hydrochlorothiazide (JXBJYNW-29) 37.5-25 MG tablet [Pharmacy Med Name: TRIAMTERENE 37.5MG / HCTZ 25MG  TABS] 45 tablet     Sig: TAKE 1 AND 1/2 TABLETS BY MOUTH DAILY      Cardiovascular: Diuretic Combos Failed - 06/26/2020  7:46 AM      Failed - Valid encounter within last 6 months    Recent Outpatient Visits           6 months ago Post covid-19 condition, unspecified   [facility] Glean Hess, MD   7 months ago Pneumonia due to COVID-19 virus   [facility] Glean Hess, MD   7 months ago Pneumonia due to COVID-19 virus   [facility] Glean Hess, MD   8 months ago Encounter by telehealth for suspected COVID-19   [facility] Glean Hess, MD   10 months ago Spondylosis of lumbar region without myelopathy or radiculopathy   [facility] Glean Hess, MD  Passed - K in normal range and within 360 days    Potassium  Date Value Ref Range Status  01/25/2020 4.3 3.5 - 5.2 mmol/L Final          Passed - Na in normal range and within 360 days    Sodium  Date Value Ref Range Status  01/25/2020 141 134 - 144 mmol/L Final          Passed - Cr in normal range and within 360 days    Creatinine, Ser  Date Value Ref Range Status  01/25/2020 0.67 0.57 - 1.00 mg/dL Final          Passed - Ca in normal range and within 360 days    Calcium  Date Value Ref Range Status  01/25/2020 9.5 8.7 - 10.2 mg/dL Final          Passed - Last BP in normal range    BP Readings from Last 1 Encounters:  05/31/20 134/83

## 2020-07-18 ENCOUNTER — Other Ambulatory Visit: Payer: Self-pay | Admitting: Internal Medicine

## 2020-07-18 NOTE — Telephone Encounter (Signed)
Requested medication (s) are due for refill today: yes  Requested medication (s) are on the active medication list: yes  Last refill:  03/06/20 #30 2 RF  Future visit scheduled: no  Notes to clinic:  last mammogram 11/20/18- cannot find that she had one on 10/2020   Requested Prescriptions  Pending Prescriptions Disp Refills   estradiol (ESTRACE) 1 MG tablet [Pharmacy Med Name: ESTRADIOL 1MG  TABLETS] 30 tablet 2    Sig: TAKE 1 TABLET BY MOUTH DAILY      OB/GYN:  Estrogens Passed - 07/18/2020  4:44 PM      Passed - Mammogram is up-to-date per Health Maintenance      Passed - Last BP in normal range    BP Readings from Last 1 Encounters:  05/31/20 134/83          Passed - Valid encounter within last 12 months    Recent Outpatient Visits           7 months ago Post covid-19 condition, unspecified   [facility] Glean Hess, MD   7 months ago Pneumonia due to COVID-19 virus   [facility] Glean Hess, MD   8 months ago Pneumonia due to COVID-19 virus   [facility] Glean Hess, MD   9 months ago Encounter by telehealth for suspected COVID-19   [facility] Glean Hess, MD   11 months ago Spondylosis of lumbar region without myelopathy or radiculopathy   [facility] Glean Hess, MD

## 2020-07-20 NOTE — Telephone Encounter (Signed)
Please schedule CPE

## 2020-08-03 ENCOUNTER — Other Ambulatory Visit: Payer: Self-pay | Admitting: Internal Medicine

## 2020-08-05 ENCOUNTER — Other Ambulatory Visit: Payer: Self-pay | Admitting: Internal Medicine

## 2020-08-05 NOTE — Telephone Encounter (Signed)
   Notes to clinic:  Requesting a 90 day supply   Requested Prescriptions  Pending Prescriptions Disp Refills   triamterene-hydrochlorothiazide (MAXZIDE-25) 37.5-25 MG tablet [Pharmacy Med Name: TRIAMTERENE 37.5MG / HCTZ 25MG  TABS] 135 tablet     Sig: TAKE 1 AND 1/2 TABLETS BY MOUTH DAILY      Cardiovascular: Diuretic Combos Failed - 08/05/2020  8:00 AM      Failed - Valid encounter within last 6 months    Recent Outpatient Visits           7 months ago Post covid-19 condition, unspecified   [facility] Glean Hess, MD   8 months ago Pneumonia due to COVID-19 virus   [facility] Glean Hess, MD   8 months ago Pneumonia due to COVID-19 virus   [facility] Glean Hess, MD   9 months ago Encounter by telehealth for suspected COVID-19   [facility] Glean Hess, MD   11 months ago Spondylosis of lumbar region without myelopathy or radiculopathy   [facility] Glean Hess, MD       Future Appointments             In 4 months [employer] [facility] Jesse Sans, MD [facility] [place] in normal range and within 360 days    Potassium  Date Value Ref Range Status  01/25/2020 4.3 3.5 - 5.2 mmol/L Final          Passed - Na in normal range and within 360 days    Sodium  Date Value Ref Range Status  01/25/2020 141 134 - 144 mmol/L Final          Passed - Cr in normal range and within 360 days    Creatinine, Ser  Date Value Ref Range Status  01/25/2020 0.67 0.57 - 1.00 mg/dL Final          Passed - Ca in normal range and within 360 days    Calcium  Date Value Ref Range Status  01/25/2020 9.5 8.7 - 10.2 mg/dL Final          Passed - Last BP in normal range    BP Readings from Last 1 Encounters:  05/31/20 134/83

## 2020-08-08 ENCOUNTER — Encounter: Payer: Self-pay | Admitting: Internal Medicine

## 2020-08-08 ENCOUNTER — Other Ambulatory Visit: Payer: Self-pay

## 2020-08-08 ENCOUNTER — Ambulatory Visit: Payer: BC Managed Care – PPO | Admitting: Internal Medicine

## 2020-08-08 VITALS — BP 116/82 | HR 73 | Temp 98.3°F | Ht 63.0 in | Wt 210.0 lb

## 2020-08-08 DIAGNOSIS — R059 Cough, unspecified: Secondary | ICD-10-CM | POA: Diagnosis not present

## 2020-08-08 DIAGNOSIS — J0140 Acute pansinusitis, unspecified: Secondary | ICD-10-CM

## 2020-08-08 MED ORDER — FLUCONAZOLE 100 MG PO TABS: 100.0000 mg | ORAL_TABLET | Freq: Once | ORAL | 0 refills | Status: AC

## 2020-08-08 MED ORDER — GUAIFENESIN-CODEINE 100-10 MG/5ML PO SYRP: 5.0000 mL | ORAL_SOLUTION | Freq: Three times a day (TID) | ORAL | 0 refills | Status: DC | PRN

## 2020-08-08 MED ORDER — AMOXICILLIN-POT CLAVULANATE 875-125 MG PO TABS: 1.0000 | ORAL_TABLET | Freq: Two times a day (BID) | ORAL | 0 refills | Status: AC

## 2020-08-08 NOTE — Progress Notes (Signed)
Date:  08/08/2020   Name:  Jade Harris   DOB:  [birth year]   MRN:  [id number]   Chief Complaint: Cough (X1 week; productive, green mucus; intermittent; took some cough syrup for relief) and Headache (Frontal and temporal; neck pain associated; 2/10 pain)  Cough This is a new problem. The current episode started in the past 7 days. The problem has been gradually worsening. The problem occurs constantly. The cough is Productive of sputum. Associated symptoms include ear pain, headaches, a sore throat and shortness of breath. Pertinent negatives include no chest pain, chills, fever or wheezing. Her past medical history is significant for environmental allergies.  Headache  Associated symptoms include coughing, ear pain, sinus pressure and a sore throat. Pertinent negatives include no fever.  Sinusitis This is a new problem. The current episode started in the past 7 days. There has been no fever. The pain is moderate. Associated symptoms include congestion, coughing, ear pain, headaches, shortness of breath, sinus pressure and a sore throat. Pertinent negatives include no chills.   Lab Results  Component Value Date   CREATININE 0.67 01/25/2020   BUN 14 01/25/2020   NA 141 01/25/2020   K 4.3 01/25/2020   CL 104 01/25/2020   CO2 23 11/07/2018   Lab Results  Component Value Date   CHOL 247 (H) 11/07/2018   HDL 86 11/07/2018   LDLCALC 129 (H) 11/07/2018   TRIG 183 (H) 11/07/2018   CHOLHDL 2.9 11/07/2018   Lab Results  Component Value Date   TSH 1.380 11/07/2018   No results found for: HGBA1C Lab Results  Component Value Date   WBC 6.8 11/07/2018   HGB 14.2 11/07/2018   HCT 41.4 11/07/2018   MCV 90 11/07/2018   PLT 277 11/07/2018   Lab Results  Component Value Date   ALT 12 11/07/2018   AST 16 01/25/2020   ALKPHOS 64 01/25/2020   BILITOT 0.3 01/25/2020     Review of Systems  Constitutional:  Negative for chills, fatigue and fever.  HENT:  Positive for  congestion, ear pain, sinus pressure and sore throat.   Respiratory:  Positive for cough and shortness of breath. Negative for chest tightness and wheezing.   Cardiovascular:  Negative for chest pain and palpitations.  Allergic/Immunologic: Positive for environmental allergies.  Neurological:  Positive for headaches.   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) 50/03/7046   History of illicit drug use 88/91/6945   History of marijuana use 02/03/2020   Spondylosis without myelopathy or radiculopathy, lumbosacral region 02/03/2020   Abnormal drug screen (01/25/2020) 02/03/2020   Chronic low back pain (1ry area of Pain) (Bilateral) (R>L) w/o sciatica 01/25/2020   Lumbosacral facet syndrome (Bilateral) (R>L) 01/25/2020   Chronic musculoskeletal pain 01/25/2020   Chronic lower extremity pain (2ry area of Pain) (Right) 01/25/2020   Chronic groin pain (Intermittent) (Right) 01/25/2020   Lumbosacral radiculopathy/radiculitis at L2 (Right) 01/25/2020   Chronic neck pain (Bilateral) (L>R) 01/25/2020   Cervicalgia 01/25/2020   Cervicogenic headache (Left) 01/25/2020   Cervico-occipital neuralgia (Left) 01/25/2020   Radiculitis involving upper extremity (Left) 01/25/2020   Numbness and tingling of upper extremity (Left) 01/25/2020  DDD (degenerative disc disease), cervical 01/25/2020   DDD (degenerative disc disease), lumbosacral 01/25/2020   Discogenic low back pain 01/25/2020   Chronic pain syndrome 01/24/2020   Pharmacologic therapy 01/24/2020   Disorder of skeletal system 01/24/2020   Problems influencing health  status 01/24/2020   Slow transit constipation 11/25/2018   Venous insufficiency of both lower extremities 11/25/2018   Eczema 08/23/2017   Headache, chronic daily 09/20/2015   Dyslipidemia 07/04/2014   Depression, major, single episode, in partial remission ([place]) 07/04/2014   Menopause 07/04/2014   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   Idiopathic insomnia 07/04/2014    Allergies  Allergen Reactions   Sulfa Antibiotics Other (See Comments)    Past Surgical History:  Procedure Laterality Date   ABDOMINAL HYSTERECTOMY     APPENDECTOMY     MOUTH SURGERY     NASAL SINUS SURGERY  04/2014   TONSILLECTOMY     TOTAL ABDOMINAL HYSTERECTOMY W/ BILATERAL SALPINGOOPHORECTOMY     wtih oophorectomy    Social History   Tobacco Use   Smoking status: Former    Pack years: 0.00    Types: Cigarettes    Quit date: 1989    Years since quitting: 33.5   Smokeless tobacco: Never  Vaping Use   Vaping Use: Never used  Substance Use Topics   Alcohol use: Yes    Alcohol/week: 0.0 standard drinks    Comment: occasional   Drug use: No     Medication list has been reviewed and updated.  Current Meds  Medication Sig   acetaminophen (TYLENOL) 500 MG tablet Take 500 mg by mouth every 6 (six) hours as needed.   B Complex Vitamins (B COMPLEX PO) Take 1 tablet by mouth daily.   Calcipotriene-Betameth Diprop 0.005-0.064 % FOAM Apply topically as needed. Eczema   ciprofloxacin (CIPRO) 250 MG tablet Take 1 tablet (250 mg total) by mouth 2 (two) times daily.   diphenhydrAMINE (BENADRYL) 25 MG tablet Take 25 mg by mouth every 6 (six) hours as needed.   DULoxetine (CYMBALTA) 30 MG capsule TAKE 3 CAPSULES(90 MG) BY MOUTH DAILY   estradiol (ESTRACE) 1 MG tablet TAKE 1 TABLET BY MOUTH DAILY   fluticasone (FLONASE) 50 MCG/ACT nasal spray USE 2 SPRAYS IN EACH NOSTRIL DAILY   ibuprofen (ADVIL) 400 MG tablet Take 400 mg by mouth every 6 (six) hours as needed.   Multiple Vitamins-Minerals (ZINC  PO) Take by mouth.   triamterene-hydrochlorothiazide (MAXZIDE-25) 37.5-25 MG tablet TAKE 1 AND 1/2 TABLETS BY MOUTH DAILY    PHQ 2/9 Scores 01/25/2020 12/11/2019 11/30/2019 11/13/2019  PHQ - 2 Score 0 0 0 2  PHQ- 9 Score - 1 1 8     GAD 7 : Generalized Anxiety Score 12/11/2019 11/30/2019 11/13/2019 08/19/2019  Nervous, Anxious, on Edge 0 0 0 1  Control/stop worrying 0 0 0 0  Worry too much - different things 0 0 0 0  Trouble relaxing 0 0 0 1  Restless 0 0 0 1  Easily annoyed or irritable 0 0 0 1  Afraid - awful might happen 0 0 0 0  Total GAD 7 Score 0 0 0 4  Anxiety Difficulty - Not difficult at all Not difficult at all Not difficult at all    BP Readings from Last 3 Encounters:  08/08/20 116/82  05/31/20 134/83  05/12/20 (!) 145/91    Physical Exam Constitutional:      Appearance: She is well-developed.  HENT:     Right Ear: Ear canal  and external ear normal. Tympanic membrane is not erythematous or retracted.     Left Ear: Ear canal and external ear normal. Tympanic membrane is not erythematous or retracted.     Nose:     Right Sinus: Maxillary sinus tenderness and frontal sinus tenderness present.     Left Sinus: Maxillary sinus tenderness and frontal sinus tenderness present.     Mouth/Throat:     Mouth: No oral lesions.     Pharynx: Uvula midline. Posterior oropharyngeal erythema present. No oropharyngeal exudate.  Cardiovascular:     Rate and Rhythm: Normal rate and regular rhythm.     Heart sounds: Normal heart sounds.  Pulmonary:     Breath sounds: Normal breath sounds. No wheezing or rales.  Lymphadenopathy:     Cervical: No cervical adenopathy.  Neurological:     Mental Status: She is alert and oriented to person, place, and time.    Wt Readings from Last 3 Encounters:  08/08/20 210 lb (95.3 kg)  05/31/20 200 lb (90.7 kg)  05/12/20 200 lb (90.7 kg)    BP 116/82 (BP Location: Right Arm, Patient Position: Sitting, Cuff Size: Normal)   Pulse 73   Temp  98.3 F (36.8 C) (Oral)   Ht 5\' 3"  (1.6 m)   Wt 210 lb (95.3 kg)   SpO2 99%   BMI 37.20 kg/m   Assessment and Plan: 1. Acute non-recurrent pansinusitis Continue Flonase spray - amoxicillin-clavulanate (AUGMENTIN) 875-125 MG tablet; Take 1 tablet by mouth 2 (two) times daily for 10 days.  Dispense: 20 tablet; Refill: 0  2. Cough - guaiFENesin-codeine (ROBITUSSIN AC) 100-10 MG/5ML syrup; Take 5 mLs by mouth 3 (three) times daily as needed for cough.  Dispense: 118 mL; Refill: 0   Partially dictated using Editor, commissioning. Any errors are unintentional.  Halina Maidens, MD [facility]  08/08/2020

## 2020-08-29 ENCOUNTER — Other Ambulatory Visit: Payer: Self-pay

## 2020-08-29 ENCOUNTER — Ambulatory Visit: Payer: BC Managed Care – PPO | Admitting: Internal Medicine

## 2020-08-29 ENCOUNTER — Encounter: Payer: Self-pay | Admitting: Internal Medicine

## 2020-08-29 VITALS — BP 124/86 | HR 76 | Temp 97.9°F | Ht 65.0 in | Wt 204.0 lb

## 2020-08-29 DIAGNOSIS — H1033 Unspecified acute conjunctivitis, bilateral: Secondary | ICD-10-CM

## 2020-08-29 MED ORDER — TOBRAMYCIN-DEXAMETHASONE 0.3-0.1 % OP SUSP: 2.0000 [drp] | Freq: Four times a day (QID) | OPHTHALMIC | 0 refills | Status: DC

## 2020-08-29 NOTE — Progress Notes (Signed)
Date:  08/29/2020   Name:  Jade Harris   DOB:  [birth year]   MRN:  [id number]   Chief Complaint: Conjunctivitis (X3 days, Right eye, feels it in left eye to, eyes running constantly, irritated , painful )  Conjunctivitis  The current episode started 3 to 5 days ago. The onset was gradual. The problem occurs continuously. The problem has been gradually worsening. The problem is moderate. Associated symptoms include eye discharge and eye redness. Pertinent negatives include no fever, no ear pain, no sore throat and no cough.   Lab Results  Component Value Date   CREATININE 0.67 01/25/2020   BUN 14 01/25/2020   NA 141 01/25/2020   K 4.3 01/25/2020   CL 104 01/25/2020   CO2 23 11/07/2018   Lab Results  Component Value Date   CHOL 247 (H) 11/07/2018   HDL 86 11/07/2018   LDLCALC 129 (H) 11/07/2018   TRIG 183 (H) 11/07/2018   CHOLHDL 2.9 11/07/2018   Lab Results  Component Value Date   TSH 1.380 11/07/2018   No results found for: HGBA1C Lab Results  Component Value Date   WBC 6.8 11/07/2018   HGB 14.2 11/07/2018   HCT 41.4 11/07/2018   MCV 90 11/07/2018   PLT 277 11/07/2018   Lab Results  Component Value Date   ALT 12 11/07/2018   AST 16 01/25/2020   ALKPHOS 64 01/25/2020   BILITOT 0.3 01/25/2020     Review of Systems  Constitutional:  Negative for chills, fatigue and fever.  HENT:  Negative for ear pain, sinus pressure and sore throat.   Eyes:  Positive for discharge and redness. Negative for visual disturbance.  Respiratory:  Negative for cough and chest tightness.    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) (Right: C5-6) 123456   History of illicit drug use 123456   History of marijuana use 02/03/2020   Spondylosis without myelopathy or radiculopathy, lumbosacral region 02/03/2020   Abnormal drug screen (01/25/2020) 02/03/2020   Chronic low back pain (1ry area of Pain) (Bilateral) (R>L) w/o sciatica 01/25/2020   Lumbosacral facet syndrome (Bilateral) (R>L) 01/25/2020   Chronic musculoskeletal pain 01/25/2020   Chronic lower extremity pain (2ry area of Pain) (Right) 01/25/2020   Chronic groin pain (Intermittent) (Right) 01/25/2020   Lumbosacral radiculopathy/radiculitis at L2 (Right) 01/25/2020   Chronic neck pain (Bilateral) (L>R) 01/25/2020   Cervicalgia 01/25/2020   Cervicogenic headache (Left) 01/25/2020   Cervico-occipital neuralgia (Left) 01/25/2020   Radiculitis involving upper extremity (Left) 01/25/2020   Numbness and tingling of upper extremity (Left) 01/25/2020   DDD (degenerative disc disease), cervical 01/25/2020   DDD (degenerative disc disease), lumbosacral 01/25/2020   Discogenic low back pain 01/25/2020   Chronic pain syndrome 01/24/2020   Pharmacologic therapy 01/24/2020   Disorder of skeletal system 01/24/2020   Problems influencing health status 01/24/2020   Slow transit constipation 11/25/2018   Venous insufficiency of both lower extremities 11/25/2018   Eczema 08/23/2017   Headache, chronic daily 09/20/2015   Dyslipidemia 07/04/2014   Depression, major, single episode, in partial remission ([place]) 07/04/2014   Menopause 07/04/2014   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  Idiopathic insomnia 07/04/2014    Allergies  Allergen Reactions   Sulfa Antibiotics Other (See Comments)    Past Surgical History:  Procedure Laterality Date   ABDOMINAL HYSTERECTOMY     APPENDECTOMY     MOUTH SURGERY     NASAL SINUS SURGERY  04/2014   TONSILLECTOMY     TOTAL ABDOMINAL HYSTERECTOMY W/ BILATERAL SALPINGOOPHORECTOMY     wtih  oophorectomy    Social History   Tobacco Use   Smoking status: Former    Types: Cigarettes    Quit date: 1989    Years since quitting: 33.6   Smokeless tobacco: Never  Vaping Use   Vaping Use: Never used  Substance Use Topics   Alcohol use: Yes    Alcohol/week: 0.0 standard drinks    Comment: occasional   Drug use: No     Medication list has been reviewed and updated.  Current Meds  Medication Sig   acetaminophen (TYLENOL) 500 MG tablet Take 500 mg by mouth every 6 (six) hours as needed.   B Complex Vitamins (B COMPLEX PO) Take 1 tablet by mouth daily.   Calcipotriene-Betameth Diprop 0.005-0.064 % FOAM Apply topically as needed. Eczema   diphenhydrAMINE (BENADRYL) 25 MG tablet Take 25 mg by mouth every 6 (six) hours as needed.   DULoxetine (CYMBALTA) 30 MG capsule TAKE 3 CAPSULES(90 MG) BY MOUTH DAILY   estradiol (ESTRACE) 1 MG tablet TAKE 1 TABLET BY MOUTH DAILY   fluticasone (FLONASE) 50 MCG/ACT nasal spray USE 2 SPRAYS IN EACH NOSTRIL DAILY   ibuprofen (ADVIL) 400 MG tablet Take 400 mg by mouth every 6 (six) hours as needed.   Multiple Vitamins-Minerals (ZINC PO) Take by mouth.   triamterene-hydrochlorothiazide (MAXZIDE-25) 37.5-25 MG tablet TAKE 1 AND 1/2 TABLETS BY MOUTH DAILY (Patient taking differently: as needed.)   [DISCONTINUED] fluconazole (DIFLUCAN) 100 MG tablet Take 100 mg by mouth daily.    PHQ 2/9 Scores 08/08/2020 01/25/2020 12/11/2019 11/30/2019  PHQ - 2 Score 0 0 0 0  PHQ- 9 Score 10 - 1 1    GAD 7 : Generalized Anxiety Score 08/08/2020 12/11/2019 11/30/2019 11/13/2019  Nervous, Anxious, on Edge 0 0 0 0  Control/stop worrying 0 0 0 0  Worry too much - different things 0 0 0 0  Trouble relaxing 1 0 0 0  Restless 0 0 0 0  Easily annoyed or irritable 1 0 0 0  Afraid - awful might happen 0 0 0 0  Total GAD 7 Score 2 0 0 0  Anxiety Difficulty Not difficult at all - Not difficult at all Not difficult at all    BP Readings from Last 3 Encounters:   08/29/20 124/86  08/08/20 116/82  05/31/20 134/83    Physical Exam Vitals and nursing note reviewed.  Constitutional:      General: She is not in acute distress.    Appearance: She is well-developed.  HENT:     Head: Normocephalic and atraumatic.  Eyes:     Extraocular Movements: Extraocular movements intact.     Conjunctiva/sclera:     Right eye: Right conjunctiva is injected. Chemosis present.     Left eye: Left conjunctiva is injected. Chemosis present.  Cardiovascular:     Rate and Rhythm: Normal rate and regular rhythm.  Pulmonary:     Effort: Pulmonary effort is normal. No respiratory distress.     Breath sounds: No wheezing or rhonchi.  Musculoskeletal:     Cervical back: Normal range of motion.  Lymphadenopathy:     Cervical: No cervical adenopathy.  Skin:    General: Skin is warm and dry.     Findings: No rash.  Neurological:     Mental Status: She is alert and oriented to person, place, and time.  Psychiatric:        Mood and Affect: Mood normal.        Behavior: Behavior normal.    Wt Readings from Last 3 Encounters:  08/29/20 204 lb (92.5 kg)  08/08/20 210 lb (95.3 kg)  05/31/20 200 lb (90.7 kg)    BP 124/86   Pulse 76   Temp 97.9 F (36.6 C) (Oral)   Ht '5\' 5"'$  (1.651 m)   Wt 204 lb (92.5 kg)   SpO2 98%   BMI 33.95 kg/m   Assessment and Plan: 1. Acute conjunctivitis of both eyes, unspecified acute conjunctivitis type Begin eye drops; avoid eye makeup contamination Warm compresses if needed - tobramycin-dexamethasone (TOBRADEX) ophthalmic solution; Place 2 drops into both eyes every 6 (six) hours.  Dispense: 5 mL; Refill: 0   Partially dictated using Editor, commissioning. Any errors are unintentional.  Halina Maidens, MD [facility]  08/29/2020

## 2020-09-09 ENCOUNTER — Encounter: Payer: Self-pay | Admitting: Internal Medicine

## 2020-09-09 ENCOUNTER — Other Ambulatory Visit: Payer: Self-pay | Admitting: Internal Medicine

## 2020-09-09 DIAGNOSIS — F324 Major depressive disorder, single episode, in partial remission: Secondary | ICD-10-CM

## 2020-09-09 MED ORDER — DULOXETINE HCL 30 MG PO CPEP: 90.0000 mg | ORAL_CAPSULE | Freq: Every day | ORAL | 5 refills | Status: DC

## 2020-09-09 NOTE — Telephone Encounter (Signed)
Please review. Last office visit 08/29/20.  KP

## 2020-09-19 IMAGING — MG MM DIGITAL DIAGNOSTIC UNILAT*R* W/ TOMO
4 series · 4 of 12 positions shown · non-contrast
Comparison: Previous exam(s).

CLINICAL DATA: 53-year-old female for further evaluation possible
RIGHT breast asymmetry on screening mammogram.

EXAM:
DIGITAL DIAGNOSTIC UNILATERAL RIGHT MAMMOGRAM WITH CAD AND TOMO

[R MLO synth-2D]
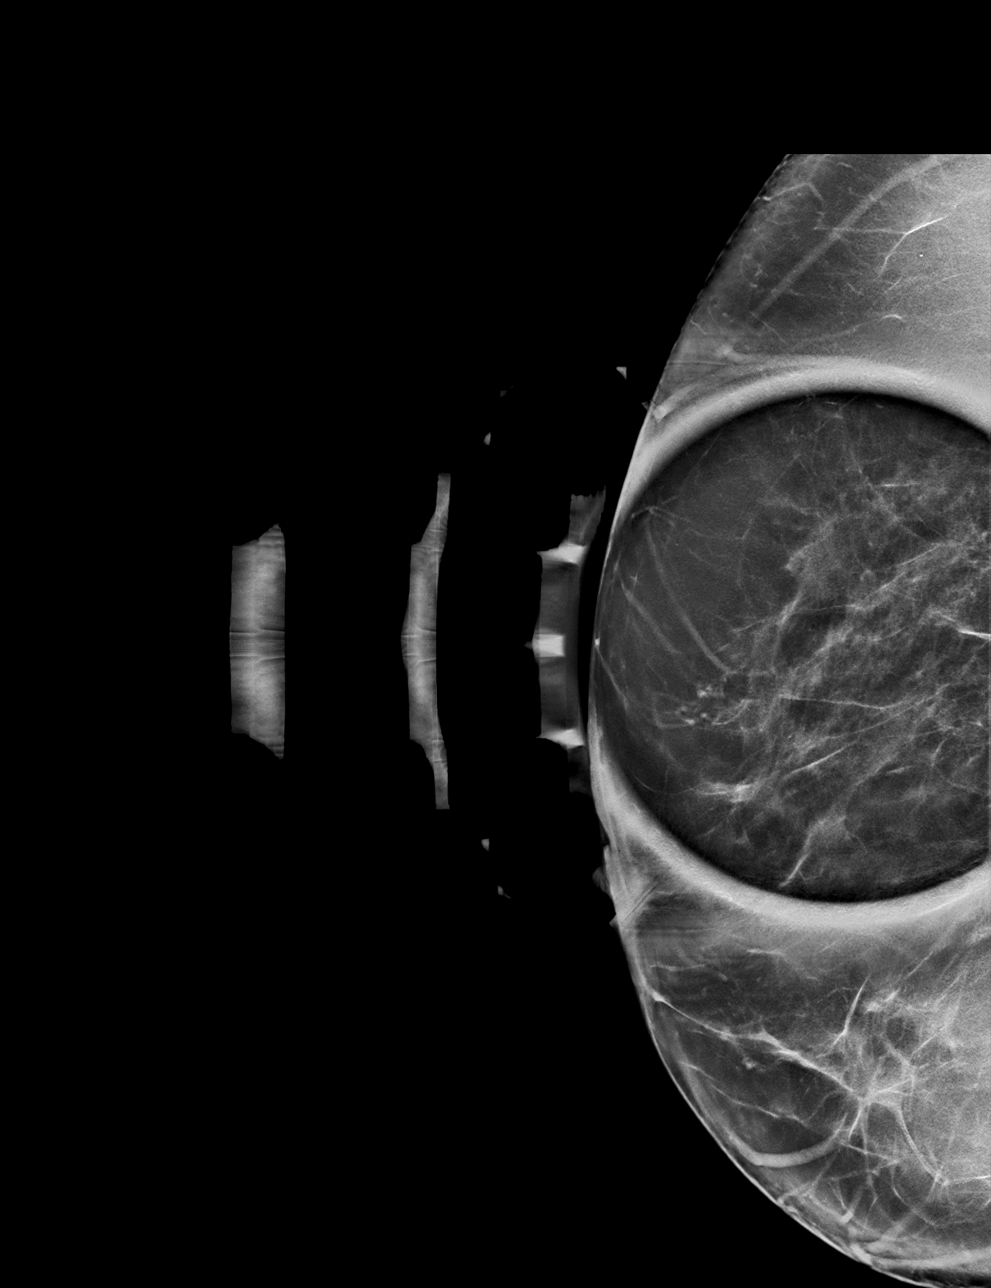

[R ML synth-2D]
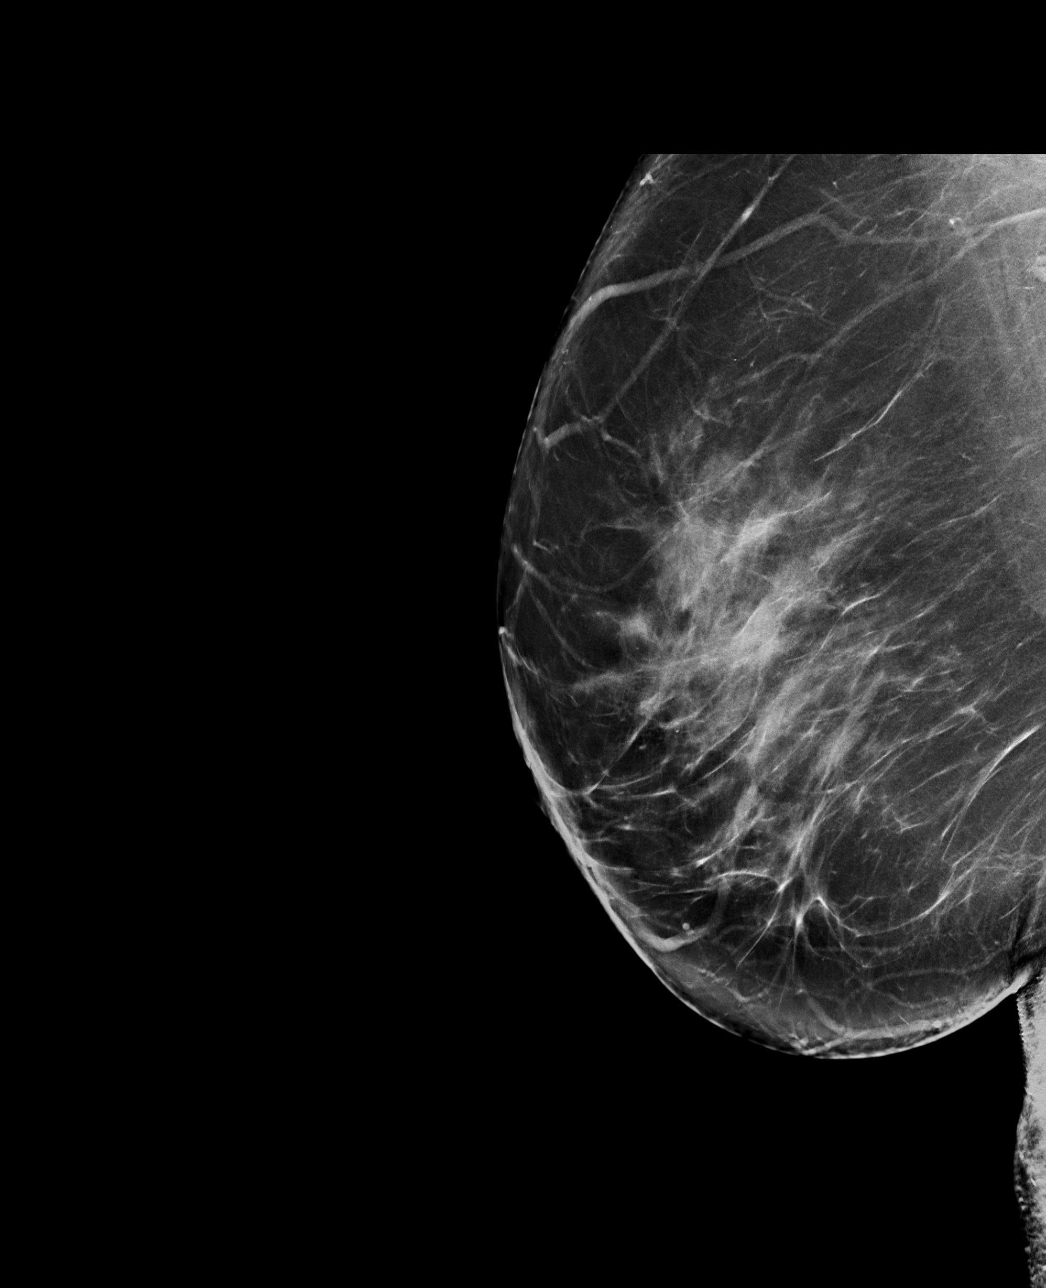

[R MLO tomo · tomo slice 33/65.0]
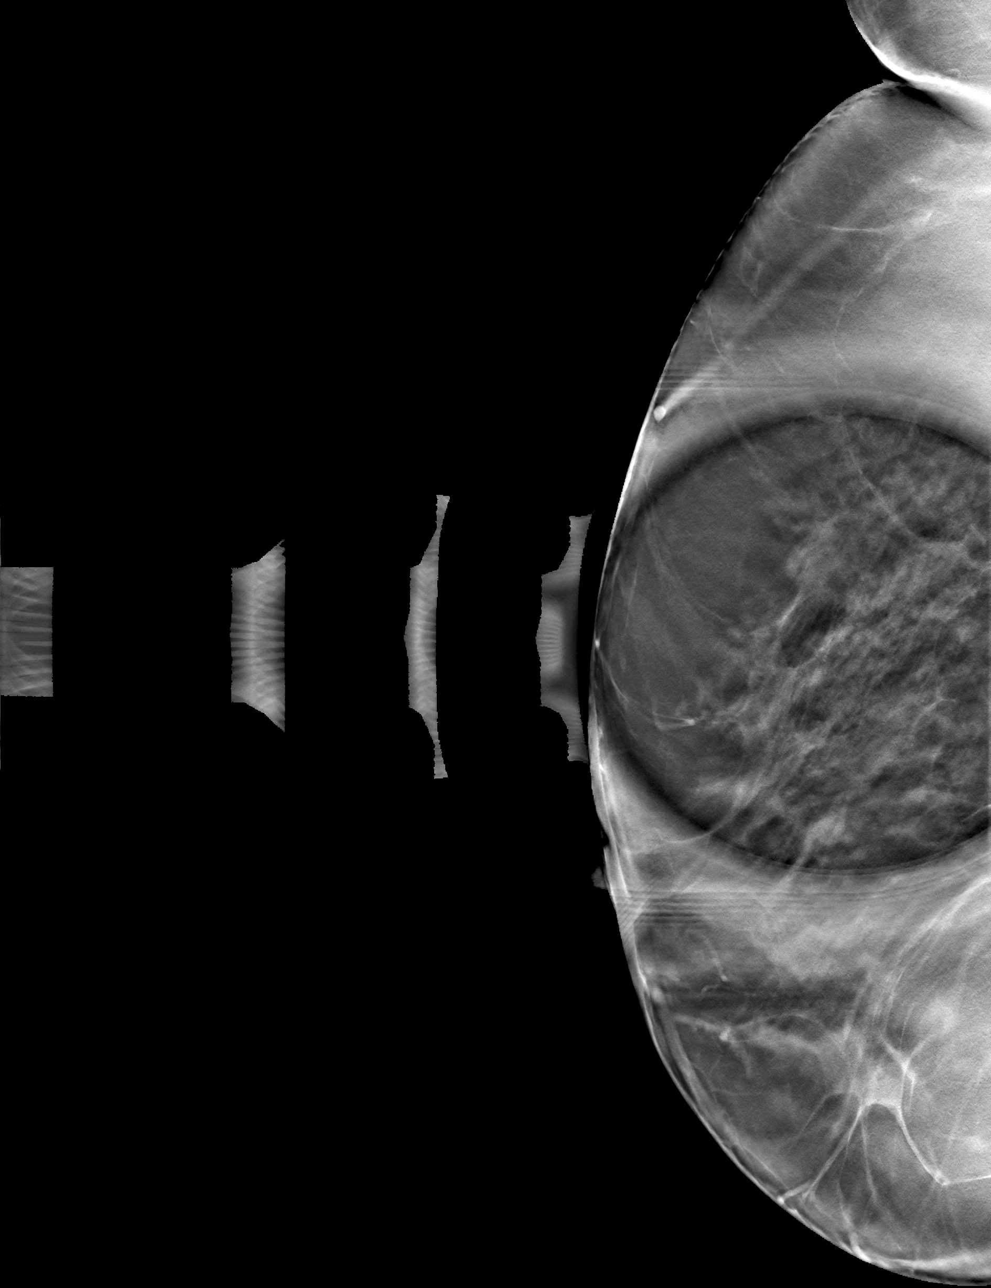

[R ML tomo · tomo slice 45/90.0]
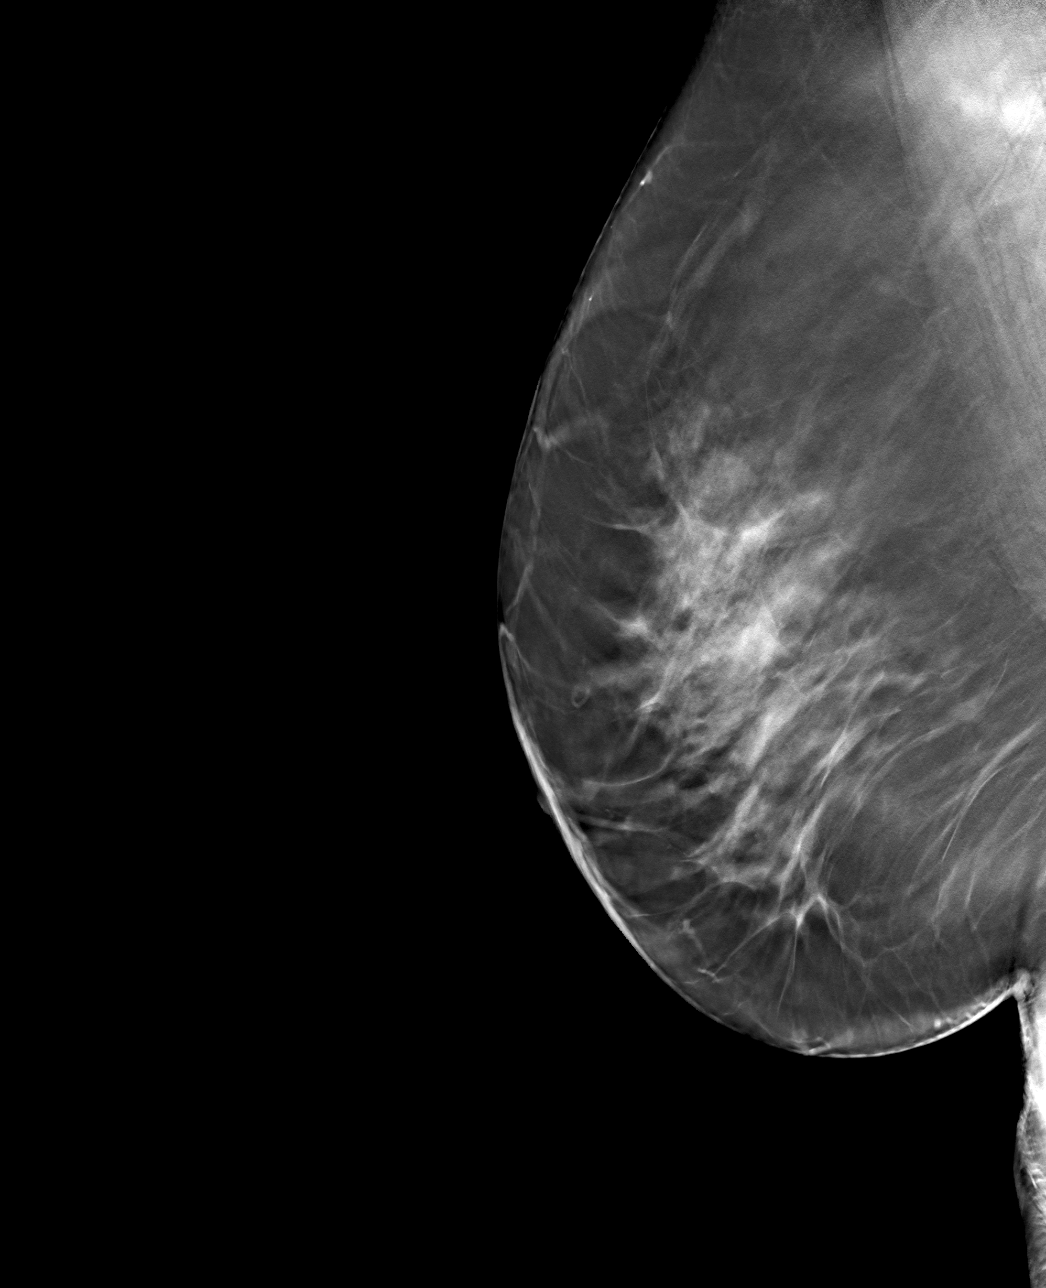

[4 of 12 positions shown; findings below may reference images not displayed]

ACR Breast Density Category c: The breast tissue is heterogeneously
dense, which may obscure small masses.
FINDINGS: 2D/3D full field and spot compression views of the RIGHT breast
demonstrate no persistent abnormality at the site of the screening
study finding. No persistent distortion noted.

Mammographic images were processed with CAD.
IMPRESSION: No persistent abnormality at the site of the screening study
finding.

RECOMMENDATION:
Bilateral screening mammogram in 1 year.

I have discussed the findings and recommendations with the patient.
If applicable, a reminder letter will be sent to the patient
regarding the next appointment.

BI-RADS CATEGORY  1: Negative.

## 2020-10-19 ENCOUNTER — Other Ambulatory Visit: Payer: Self-pay | Admitting: Internal Medicine

## 2020-12-04 ENCOUNTER — Encounter: Payer: Self-pay | Admitting: Internal Medicine

## 2020-12-04 DIAGNOSIS — Z90710 Acquired absence of both cervix and uterus: Secondary | ICD-10-CM | POA: Insufficient documentation

## 2020-12-05 ENCOUNTER — Ambulatory Visit (INDEPENDENT_AMBULATORY_CARE_PROVIDER_SITE_OTHER): Payer: BC Managed Care – PPO | Admitting: Internal Medicine

## 2020-12-05 ENCOUNTER — Encounter: Payer: Self-pay | Admitting: Internal Medicine

## 2020-12-05 ENCOUNTER — Other Ambulatory Visit: Payer: Self-pay

## 2020-12-05 VITALS — BP 128/78 | HR 97 | Ht 65.0 in | Wt 202.2 lb

## 2020-12-05 DIAGNOSIS — Z1231 Encounter for screening mammogram for malignant neoplasm of breast: Secondary | ICD-10-CM

## 2020-12-05 DIAGNOSIS — Z1211 Encounter for screening for malignant neoplasm of colon: Secondary | ICD-10-CM

## 2020-12-05 DIAGNOSIS — E559 Vitamin D deficiency, unspecified: Secondary | ICD-10-CM

## 2020-12-05 DIAGNOSIS — R0602 Shortness of breath: Secondary | ICD-10-CM | POA: Diagnosis not present

## 2020-12-05 DIAGNOSIS — Z0000 Encounter for general adult medical examination without abnormal findings: Secondary | ICD-10-CM | POA: Diagnosis not present

## 2020-12-05 DIAGNOSIS — F324 Major depressive disorder, single episode, in partial remission: Secondary | ICD-10-CM | POA: Diagnosis not present

## 2020-12-05 DIAGNOSIS — E785 Hyperlipidemia, unspecified: Secondary | ICD-10-CM

## 2020-12-05 DIAGNOSIS — Z780 Asymptomatic menopausal state: Secondary | ICD-10-CM

## 2020-12-05 MED ORDER — FLUTICASONE-SALMETEROL 100-50 MCG/ACT IN AEPB: 1.0000 | INHALATION_SPRAY | Freq: Two times a day (BID) | RESPIRATORY_TRACT | 3 refills | Status: DC

## 2020-12-05 MED ORDER — TRIAMTERENE-HCTZ 37.5-25 MG PO TABS: 1.5000 | ORAL_TABLET | Freq: Every day | ORAL | 1 refills | Status: DC

## 2020-12-05 NOTE — Progress Notes (Signed)
Date:  12/05/2020   Name:  GIULIANNA ROCHA   DOB:  12/03/1965   MRN:  [id number]   Chief Complaint: Annual Exam (Breast Exam. No pap. ) Jade Harris is a 55 y.o. female who presents today for her Complete Annual Exam. She feels well. She reports exercising some. Having difficulty with shortness of breath with walking short distances, and chronic back pain. She reports she is sleeping poorly. Breast complaints - none.  Mammogram: 10/2018 DEXA: none Pap smear: discontinued Colonoscopy: none  Immunization History  Administered Date(s) Administered   Influenza,inj,Quad PF,6+ Mos 11/18/2015, 01/01/2017, 11/07/2018    Depression        This is a chronic problem.The problem is unchanged.  Associated symptoms include decreased concentration.  Associated symptoms include no fatigue, no headaches and no suicidal ideas.  Past treatments include SNRIs - Serotonin and norepinephrine reuptake inhibitors (she tried to reduce the dose to 60 mg but sx worsened).  Compliance with treatment is good.  Previous treatment provided moderate relief.  (covid 19 infection last fall) Shortness of Breath This is a recurrent problem. The problem occurs every several days. The problem has been unchanged. Pertinent negatives include no abdominal pain, chest pain, fever, headaches, leg swelling, rash, vomiting or wheezing. The symptoms are aggravated by any activity. She has tried nothing for the symptoms.   Lab Results  Component Value Date   CREATININE 0.67 01/25/2020   BUN 14 01/25/2020   NA 141 01/25/2020   K 4.3 01/25/2020   CL 104 01/25/2020   CO2 23 11/07/2018   Lab Results  Component Value Date   CHOL 247 (H) 11/07/2018   HDL 86 11/07/2018   LDLCALC 129 (H) 11/07/2018   TRIG 183 (H) 11/07/2018   CHOLHDL 2.9 11/07/2018   Lab Results  Component Value Date   TSH 1.380 11/07/2018   No results found for: HGBA1C Lab Results  Component Value Date   WBC 6.8 11/07/2018   HGB 14.2  11/07/2018   HCT 41.4 11/07/2018   MCV 90 11/07/2018   PLT 277 11/07/2018   Lab Results  Component Value Date   ALT 12 11/07/2018   AST 16 01/25/2020   ALKPHOS 64 01/25/2020   BILITOT 0.3 01/25/2020     Review of Systems  Constitutional:  Negative for chills, fatigue and fever.  HENT:  Negative for congestion, hearing loss, tinnitus, trouble swallowing and voice change.   Eyes:  Negative for visual disturbance.  Respiratory:  Positive for shortness of breath. Negative for cough, chest tightness and wheezing.   Cardiovascular:  Negative for chest pain, palpitations and leg swelling.  Gastrointestinal:  Negative for abdominal pain, constipation, diarrhea and vomiting.  Endocrine: Negative for polydipsia and polyuria.  Genitourinary:  Negative for dysuria, frequency, genital sores, vaginal bleeding and vaginal discharge.  Musculoskeletal:  Positive for back pain. Negative for arthralgias, gait problem and joint swelling.  Skin:  Negative for color change and rash.  Neurological:  Negative for dizziness, tremors, light-headedness and headaches.  Hematological:  Negative for adenopathy. Does not bruise/bleed easily.  Psychiatric/Behavioral:  Positive for decreased concentration and depression. Negative for dysphoric mood, sleep disturbance and suicidal ideas. The patient is not nervous/anxious.    Patient Active Problem List   Diagnosis Date Noted   S/P TAH (total abdominal hysterectomy) 12/04/2020   Chronic sacroiliac joint pain (Right) 02/24/2020   Other spondylosis, sacral and sacrococcygeal region 02/24/2020   Enthesopathy of sacroiliac joint (Right) 02/24/2020   Vitamin D insufficiency  02/03/2020   Lumbar facet hypertrophy 02/03/2020   Cervical facet hypertrophy (Multilevel) (Bilateral) 02/03/2020   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) 02/03/2020   History of marijuana use 02/03/2020   Spondylosis without myelopathy or radiculopathy, lumbosacral region  02/03/2020   Chronic low back pain (1ry area of Pain) (Bilateral) (R>L) w/o sciatica 01/25/2020   Lumbosacral facet syndrome (Bilateral) (R>L) 01/25/2020   Chronic musculoskeletal pain 01/25/2020   Chronic lower extremity pain (2ry area of Pain) (Right) 01/25/2020   Chronic groin pain (Intermittent) (Right) 01/25/2020   Lumbosacral radiculopathy/radiculitis at L2 (Right) 01/25/2020   Chronic neck pain (Bilateral) (L>R) 01/25/2020   Cervicalgia 01/25/2020   Cervicogenic headache (Left) 01/25/2020   Cervico-occipital neuralgia (Left) 01/25/2020   Radiculitis involving upper extremity (Left) 01/25/2020   Numbness and tingling of upper extremity (Left) 01/25/2020   DDD (degenerative disc disease), cervical 01/25/2020   DDD (degenerative disc disease), lumbosacral 01/25/2020   Discogenic low back pain 01/25/2020   Chronic pain syndrome 01/24/2020   Disorder of skeletal system 01/24/2020   Slow transit constipation 11/25/2018   Venous insufficiency of both lower extremities 11/25/2018   Eczema 08/23/2017   Headache, chronic daily 09/20/2015   Dyslipidemia 07/04/2014   Depression, major, single episode, in partial remission ([place]) 07/04/2014   Menopause 07/04/2014   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   Idiopathic insomnia 07/04/2014    Allergies  Allergen Reactions   Sulfa Antibiotics Other (See Comments)    Past Surgical History:  Procedure Laterality Date   APPENDECTOMY     MOUTH SURGERY     NASAL SINUS SURGERY  04/2014   TONSILLECTOMY     TOTAL ABDOMINAL HYSTERECTOMY W/ BILATERAL SALPINGOOPHORECTOMY     wtih oophorectomy    Social History   Tobacco Use   Smoking status: Former    Types: Cigarettes    Quit date: 1989    Years since quitting: 33.8   Smokeless tobacco: Never  Vaping Use   Vaping Use: Never used  Substance Use Topics   Alcohol use: Yes    Alcohol/week: 0.0 standard drinks    Comment: occasional   Drug use: No     Medication list has  been reviewed and updated.  Current Meds  Medication Sig   acetaminophen (TYLENOL) 500 MG tablet Take 500 mg by mouth every 6 (six) hours as needed.   B Complex Vitamins (B COMPLEX PO) Take 1 tablet by mouth daily.   Calcipotriene-Betameth Diprop 0.005-0.064 % FOAM Apply topically as needed. Eczema   diphenhydrAMINE (BENADRYL) 25 MG tablet Take 25 mg by mouth every 6 (six) hours as needed.   DULoxetine (CYMBALTA) 30 MG capsule Take 3 capsules (90 mg total) by mouth daily.   estradiol (ESTRACE) 1 MG tablet TAKE 1 TABLET BY MOUTH DAILY   fluticasone (FLONASE) 50 MCG/ACT nasal spray USE 2 SPRAYS IN EACH NOSTRIL DAILY   ibuprofen (ADVIL) 400 MG tablet Take 400 mg by mouth every 6 (six) hours as needed.   triamterene-hydrochlorothiazide (MAXZIDE-25) 37.5-25 MG tablet TAKE 1 AND 1/2 TABLETS BY MOUTH DAILY (Patient taking differently: as needed.)    PHQ 2/9 Scores 12/05/2020 08/08/2020 01/25/2020 12/11/2019  PHQ - 2 Score 0 0 0 0  PHQ- 9 Score 0 10 - 1    GAD 7 : Generalized Anxiety Score 12/05/2020 08/08/2020 12/11/2019 11/30/2019  Nervous, Anxious, on Edge 0 0 0 0  Control/stop worrying 0 0 0 0  Worry too much - different things 0 0 0 0  Trouble relaxing  0 1 0 0  Restless 0 0 0 0  Easily annoyed or irritable 0 1 0 0  Afraid - awful might happen 0 0 0 0  Total GAD 7 Score 0 2 0 0  Anxiety Difficulty Not difficult at all Not difficult at all - Not difficult at all    BP Readings from Last 3 Encounters:  12/05/20 128/78  08/29/20 124/86  08/08/20 116/82    Physical Exam Vitals and nursing note reviewed.  Constitutional:      General: She is not in acute distress.    Appearance: She is well-developed.  HENT:     Head: Normocephalic and atraumatic.     Right Ear: Tympanic membrane and ear canal normal.     Left Ear: Tympanic membrane and ear canal normal.     Nose:     Right Sinus: No maxillary sinus tenderness.     Left Sinus: No maxillary sinus tenderness.  Eyes:     General: No  scleral icterus.       Right eye: No discharge.        Left eye: No discharge.     Conjunctiva/sclera: Conjunctivae normal.  Neck:     Thyroid: No thyromegaly.     Vascular: No carotid bruit.  Cardiovascular:     Rate and Rhythm: Normal rate and regular rhythm.     Pulses: Normal pulses.     Heart sounds: Normal heart sounds.  Pulmonary:     Effort: Pulmonary effort is normal. No respiratory distress.     Breath sounds: No wheezing.  Chest:  Breasts:    Right: No mass, nipple discharge, skin change or tenderness.     Left: No mass, nipple discharge, skin change or tenderness.  Abdominal:     General: Bowel sounds are normal.     Palpations: Abdomen is soft.     Tenderness: There is no abdominal tenderness.  Musculoskeletal:     Cervical back: Normal range of motion. No erythema.     Lumbar back: Tenderness present. Decreased range of motion.     Right lower leg: No edema.     Left lower leg: No edema.  Lymphadenopathy:     Cervical: No cervical adenopathy.  Skin:    General: Skin is warm and dry.     Findings: No rash.  Neurological:     Mental Status: She is alert and oriented to person, place, and time.     Cranial Nerves: Cranial nerves 2-12 are intact. No cranial nerve deficit.     Sensory: Sensation is intact. No sensory deficit.     Coordination: Coordination is intact.     Gait: Gait abnormal.     Deep Tendon Reflexes: Reflexes are normal and symmetric.  Psychiatric:        Attention and Perception: Attention normal.        Mood and Affect: Mood normal.    Wt Readings from Last 3 Encounters:  12/05/20 202 lb 3.2 oz (91.7 kg)  08/29/20 204 lb (92.5 kg)  08/08/20 210 lb (95.3 kg)    BP 128/78   Pulse 97   Ht 5\' 5"  (1.651 m)   Wt 202 lb 3.2 oz (91.7 kg)   SpO2 99%   BMI 33.65 kg/m   Assessment and Plan: 1. Annual physical exam Exam is normal except for weight. Encourage regular exercise and appropriate dietary changes. She declines Covid and Flu  vaccines - CBC with Differential/Platelet - Comprehensive metabolic panel - Hemoglobin A1c  2. Encounter for screening mammogram for breast cancer To be scheduled at [facility]  3. Shortness of breath Since covid - exam is normal Will start Advair bid and refer - fluticasone-salmeterol (ADVAIR) 100-50 MCG/ACT AEPB; Inhale 1 puff into the lungs 2 (two) times daily.  Dispense: 1 each; Refill: 3 - Ambulatory referral to Pulmonology  4. Depression, major, single episode, in partial remission (HCC) Clinically stable on current regimen with good control of symptoms, No SI or HI. Will continue current therapy for now.  Consider other meds if needed. - TSH  5. Colon cancer screening She is hesitant at this time Will discuss again next visit  6. Dyslipidemia - Lipid panel  7. Menopause Continue estrace  8. Vitamin D insufficiency Check labs and advise on supplementation - VITAMIN D 25 Hydroxy (Vit-D Deficiency, Fractures)   Partially dictated using Editor, commissioning. Any errors are unintentional.  Halina Maidens, MD [facility]  12/05/2020

## 2020-12-06 ENCOUNTER — Encounter: Payer: Self-pay | Admitting: Internal Medicine

## 2020-12-06 LAB — CBC WITH DIFFERENTIAL/PLATELET
Basophils Absolute: 0 10*3/uL (ref 0.0–0.2)
Basos: 0 %
EOS (ABSOLUTE): 0 10*3/uL (ref 0.0–0.4)
Eos: 0 %
Hematocrit: 43.6 % (ref 34.0–46.6)
Hemoglobin: 14.3 g/dL (ref 11.1–15.9)
Immature Grans (Abs): 0 10*3/uL (ref 0.0–0.1)
Immature Granulocytes: 0 %
Lymphocytes Absolute: 1.7 10*3/uL (ref 0.7–3.1)
Lymphs: 25 %
MCH: 29.3 pg (ref 26.6–33.0)
MCHC: 32.8 g/dL (ref 31.5–35.7)
MCV: 89 fL (ref 79–97)
Monocytes Absolute: 0.6 10*3/uL (ref 0.1–0.9)
Monocytes: 9 %
Neutrophils Absolute: 4.7 10*3/uL (ref 1.4–7.0)
Neutrophils: 66 %
Platelets: 291 10*3/uL (ref 150–450)
RBC: 4.88 x10E6/uL (ref 3.77–5.28)
RDW: 12.6 % (ref 11.7–15.4)
WBC: 7.1 10*3/uL (ref 3.4–10.8)

## 2020-12-06 LAB — LIPID PANEL
Chol/HDL Ratio: 3.2 ratio (ref 0.0–4.4)
Cholesterol, Total: 276 mg/dL — ABNORMAL HIGH (ref 100–199)
HDL: 85 mg/dL (ref >39)
LDL Chol Calc (NIH): 166 mg/dL — ABNORMAL HIGH (ref 0–99)
Triglycerides: 141 mg/dL (ref 0–149)
VLDL Cholesterol Cal: 25 mg/dL (ref 5–40)

## 2020-12-06 LAB — COMPREHENSIVE METABOLIC PANEL
ALT: 10 IU/L (ref 0–32)
AST: 14 IU/L (ref 0–40)
Albumin/Globulin Ratio: 1.8 (ref 1.2–2.2)
Albumin: 4.8 g/dL (ref 3.8–4.9)
Alkaline Phosphatase: 79 IU/L (ref 44–121)
BUN/Creatinine Ratio: 17 (ref 9–23)
BUN: 14 mg/dL (ref 6–24)
Bilirubin Total: 0.3 mg/dL (ref 0.0–1.2)
CO2: 22 mmol/L (ref 20–29)
Calcium: 10.4 mg/dL — ABNORMAL HIGH (ref 8.7–10.2)
Chloride: 102 mmol/L (ref 96–106)
Creatinine, Ser: 0.81 mg/dL (ref 0.57–1.00)
Globulin, Total: 2.7 g/dL (ref 1.5–4.5)
Glucose: 95 mg/dL (ref 70–99)
Potassium: 4.7 mmol/L (ref 3.5–5.2)
Sodium: 142 mmol/L (ref 134–144)
Total Protein: 7.5 g/dL (ref 6.0–8.5)
eGFR: 85 mL/min/{1.73_m2} (ref >59)

## 2020-12-06 LAB — TSH: TSH: 1.07 u[IU]/mL (ref 0.450–4.500)

## 2020-12-06 LAB — HEMOGLOBIN A1C
Est. average glucose Bld gHb Est-mCnc: 103 mg/dL
Hgb A1c MFr Bld: 5.2 % (ref 4.8–5.6)

## 2020-12-06 LAB — VITAMIN D 25 HYDROXY (VIT D DEFICIENCY, FRACTURES): Vit D, 25-Hydroxy: 29 ng/mL — ABNORMAL LOW (ref 30.0–100.0)

## 2020-12-06 NOTE — Telephone Encounter (Signed)
Dr. Army Melia has not resulted these labs yet. She will view them and send patient a message today.

## 2020-12-25 ENCOUNTER — Encounter: Payer: Self-pay | Admitting: Internal Medicine

## 2020-12-26 ENCOUNTER — Ambulatory Visit: Payer: BC Managed Care – PPO | Admitting: Internal Medicine

## 2020-12-26 ENCOUNTER — Encounter: Payer: Self-pay | Admitting: Internal Medicine

## 2020-12-26 ENCOUNTER — Other Ambulatory Visit: Payer: Self-pay

## 2020-12-26 VITALS — BP 128/78 | HR 81 | Temp 97.8°F | Ht 65.0 in | Wt 209.0 lb

## 2020-12-26 DIAGNOSIS — N3000 Acute cystitis without hematuria: Secondary | ICD-10-CM

## 2020-12-26 LAB — POCT UA - MICROSCOPIC ONLY
Mucus, UA: 0
RBC, Urine, Miroscopic: 2 (ref 0–2)
WBC, Ur, HPF, POC: 5 (ref 0–5)
Yeast, UA: NEGATIVE

## 2020-12-26 MED ORDER — NITROFURANTOIN MONOHYD MACRO 100 MG PO CAPS: 100.0000 mg | ORAL_CAPSULE | Freq: Two times a day (BID) | ORAL | 0 refills | Status: AC

## 2020-12-26 NOTE — Progress Notes (Signed)
Date:  12/26/2020   Name:  Jade Harris   DOB:  [birth year]   MRN:  [id number]   Chief Complaint: Urinary Tract Infection  Urinary Tract Infection  This is a new problem. The current episode started in the past 7 days. The problem has been gradually worsening. The quality of the pain is described as burning. The pain is mild. There has been no fever. She is Sexually active. There is No history of pyelonephritis. Associated symptoms include frequency, hesitancy, nausea and urgency. Pertinent negatives include no chills or vomiting.   Lab Results  Component Value Date   NA 142 12/05/2020   K 4.7 12/05/2020   CO2 22 12/05/2020   GLUCOSE 95 12/05/2020   BUN 14 12/05/2020   CREATININE 0.81 12/05/2020   CALCIUM 10.4 (H) 12/05/2020   EGFR 85 12/05/2020   GFRNONAA 99 01/25/2020   Lab Results  Component Value Date   CHOL 276 (H) 12/05/2020   HDL 85 12/05/2020   LDLCALC 166 (H) 12/05/2020   TRIG 141 12/05/2020   CHOLHDL 3.2 12/05/2020   Lab Results  Component Value Date   TSH 1.070 12/05/2020   Lab Results  Component Value Date   HGBA1C 5.2 12/05/2020   Lab Results  Component Value Date   WBC 7.1 12/05/2020   HGB 14.3 12/05/2020   HCT 43.6 12/05/2020   MCV 89 12/05/2020   PLT 291 12/05/2020   Lab Results  Component Value Date   ALT 10 12/05/2020   AST 14 12/05/2020   ALKPHOS 79 12/05/2020   BILITOT 0.3 12/05/2020   No components found for: VITD  Review of Systems  Constitutional:  Negative for chills, fatigue and fever.  Respiratory:  Negative for cough and shortness of breath.   Cardiovascular:  Negative for chest pain and palpitations.  Gastrointestinal:  Positive for nausea. Negative for vomiting.  Genitourinary:  Positive for frequency, hesitancy and urgency. Negative for difficulty urinating.  Musculoskeletal:  Positive for back pain.  Neurological:  Negative for dizziness and headaches.   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) 02/03/2020   History of marijuana use 02/03/2020   Spondylosis without myelopathy or radiculopathy, lumbosacral region 02/03/2020   Chronic low back pain (1ry area of Pain) (Bilateral) (R>L) w/o sciatica 01/25/2020   Lumbosacral facet syndrome (Bilateral) (R>L) 01/25/2020   Chronic musculoskeletal pain 01/25/2020   Chronic lower extremity pain (2ry area of Pain) (Right) 01/25/2020   Chronic groin pain (Intermittent) (Right) 01/25/2020   Lumbosacral radiculopathy/radiculitis at L2 (Right) 01/25/2020   Chronic neck pain (Bilateral) (L>R) 01/25/2020   Cervicalgia 01/25/2020   Cervicogenic headache (Left) 01/25/2020   Cervico-occipital neuralgia (Left) 01/25/2020   Radiculitis involving upper extremity (Left) 01/25/2020   Numbness and tingling of upper extremity (Left) 01/25/2020   DDD (degenerative disc disease), cervical 01/25/2020   DDD (degenerative disc disease), lumbosacral 01/25/2020   Discogenic low back pain 01/25/2020   Chronic pain syndrome 01/24/2020   Disorder of skeletal system 01/24/2020   Slow transit constipation 11/25/2018   Venous insufficiency of both lower extremities 11/25/2018   Eczema 08/23/2017   Headache, chronic  daily 09/20/2015   Dyslipidemia 07/04/2014   Depression, major, single episode, in partial remission ([place]) 07/04/2014   Menopause 07/04/2014   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   Idiopathic insomnia 07/04/2014    Allergies  Allergen Reactions   Sulfa Antibiotics Other (See Comments)    Past Surgical History:   Procedure Laterality Date   APPENDECTOMY     MOUTH SURGERY     NASAL SINUS SURGERY  04/2014   TONSILLECTOMY     TOTAL ABDOMINAL HYSTERECTOMY W/ BILATERAL SALPINGOOPHORECTOMY     wtih oophorectomy    Social History   Tobacco Use   Smoking status: Former    Types: Cigarettes    Quit date: 1989    Years since quitting: 33.9   Smokeless tobacco: Never  Vaping Use   Vaping Use: Never used  Substance Use Topics   Alcohol use: Yes    Alcohol/week: 0.0 standard drinks    Comment: occasional   Drug use: No     Medication list has been reviewed and updated.  Current Meds  Medication Sig   acetaminophen (TYLENOL) 500 MG tablet Take 500 mg by mouth every 6 (six) hours as needed.   B Complex Vitamins (B COMPLEX PO) Take 1 tablet by mouth daily.   Calcipotriene-Betameth Diprop 0.005-0.064 % FOAM Apply topically as needed. Eczema   diphenhydrAMINE (BENADRYL) 25 MG tablet Take 25 mg by mouth every 6 (six) hours as needed.   DULoxetine (CYMBALTA) 30 MG capsule Take 3 capsules (90 mg total) by mouth daily.   estradiol (ESTRACE) 1 MG tablet TAKE 1 TABLET BY MOUTH DAILY   fluticasone (FLONASE) 50 MCG/ACT nasal spray USE 2 SPRAYS IN EACH NOSTRIL DAILY   fluticasone-salmeterol (ADVAIR) 100-50 MCG/ACT AEPB Inhale 1 puff into the lungs 2 (two) times daily.   ibuprofen (ADVIL) 400 MG tablet Take 400 mg by mouth every 6 (six) hours as needed.   Multiple Vitamins-Minerals (ZINC PO) Take by mouth.   triamterene-hydrochlorothiazide (MAXZIDE-25) 37.5-25 MG tablet Take 1.5 tablets by mouth daily.    PHQ 2/9 Scores 12/26/2020 12/05/2020 08/08/2020 01/25/2020  PHQ - 2 Score 0 0 0 0  PHQ- 9 Score 10 0 10 -    GAD 7 : Generalized Anxiety Score 12/26/2020 12/05/2020 08/08/2020 12/11/2019  Nervous, Anxious, on Edge 0 0 0 0  Control/stop worrying 0 0 0 0  Worry too much - different things 0 0 0 0  Trouble relaxing 0 0 1 0  Restless 0 0 0 0  Easily annoyed or irritable 0 0 1 0  Afraid - awful might  happen 0 0 0 0  Total GAD 7 Score 0 0 2 0  Anxiety Difficulty Not difficult at all Not difficult at all Not difficult at all -    BP Readings from Last 3 Encounters:  12/26/20 128/78  12/05/20 128/78  08/29/20 124/86    Physical Exam Vitals and nursing note reviewed.  Constitutional:      Appearance: She is well-developed.  Cardiovascular:     Rate and Rhythm: Normal rate and regular rhythm.     Heart sounds: Normal heart sounds.  Pulmonary:     Effort: Pulmonary effort is normal. No respiratory distress.     Breath sounds: Normal breath sounds.  Abdominal:     General: Bowel sounds are normal.     Palpations: Abdomen is soft.     Tenderness: There is abdominal tenderness in the suprapubic area. There is no right CVA tenderness, left CVA tenderness, guarding  or rebound.    Wt Readings from Last 3 Encounters:  12/26/20 209 lb (94.8 kg)  12/05/20 202 lb 3.2 oz (91.7 kg)  08/29/20 204 lb (92.5 kg)    BP 128/78   Pulse 81   Temp 97.8 F (36.6 C) (Oral)   Ht _0  (1.651 m)   Wt 209 lb (94.8 kg)   SpO2 97%   BMI 34.78 kg/m   Assessment and Plan: 1. Acute cystitis without hematuria Continue to push fluids; use AZO if needed Follow up if needed - POCT UA - Microscopic Only - nitrofurantoin, macrocrystal-monohydrate, (MACROBID) 100 MG capsule; Take 1 capsule (100 mg total) by mouth 2 (two) times daily for 7 days.  Dispense: 14 capsule; Refill: 0   Partially dictated using Editor, commissioning. Any errors are unintentional.  Halina Maidens, MD [facility]  12/26/2020

## 2021-01-10 ENCOUNTER — Telehealth: Payer: Self-pay

## 2021-01-10 NOTE — Telephone Encounter (Signed)
Called pt left VM to call and schedule mammogram.  [facility] nurse may give results to patient if they return call to clinic, a CRM has been created.  KP

## 2021-01-21 ENCOUNTER — Other Ambulatory Visit: Payer: Self-pay | Admitting: Internal Medicine

## 2021-01-24 ENCOUNTER — Other Ambulatory Visit: Payer: Self-pay | Admitting: Internal Medicine

## 2021-01-24 NOTE — Telephone Encounter (Signed)
Requested medication (s) are due for refill today:   Yes  Requested medication (s) are on the active medication list:   Yes  Future visit scheduled:   No   Last ordered: 10/19/2020 #30, 2 refills  Returned because mammogram is not up to date per protocol.   Requested Prescriptions  Pending Prescriptions Disp Refills   estradiol (ESTRACE) 1 MG tablet [Pharmacy Med Name: ESTRADIOL 1MG  TABLETS] 30 tablet 2    Sig: TAKE 1 TABLET BY MOUTH DAILY     OB/GYN:  Estrogens Failed - 01/21/2021 11:39 PM      Failed - Mammogram is up-to-date per Health Maintenance      Passed - Last BP in normal range    BP Readings from Last 1 Encounters:  12/26/20 128/78          Passed - Valid encounter within last 12 months    Recent Outpatient Visits           4 weeks ago Acute cystitis without hematuria   [facility] Glean Hess, MD   1 month ago Annual physical exam   [facility] Glean Hess, MD   4 months ago Acute conjunctivitis of both eyes, unspecified acute conjunctivitis type   [facility] Glean Hess, MD   5 months ago Acute non-recurrent pansinusitis   [facility] Glean Hess, MD   1 year ago Post covid-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

## 2021-06-08 ENCOUNTER — Encounter: Payer: Self-pay | Admitting: Internal Medicine

## 2021-06-08 ENCOUNTER — Ambulatory Visit: Payer: BC Managed Care – PPO | Admitting: Internal Medicine

## 2021-06-08 VITALS — BP 124/84 | HR 63 | Ht 65.0 in | Wt 212.0 lb

## 2021-06-08 DIAGNOSIS — M4802 Spinal stenosis, cervical region: Secondary | ICD-10-CM | POA: Diagnosis not present

## 2021-06-08 DIAGNOSIS — Z23 Encounter for immunization: Secondary | ICD-10-CM

## 2021-06-08 MED ORDER — GABAPENTIN 300 MG PO CAPS: 300.0000 mg | ORAL_CAPSULE | Freq: Two times a day (BID) | ORAL | 1 refills | Status: DC

## 2021-06-08 NOTE — Progress Notes (Signed)
? ? ?Date:  06/08/2021  ? ?Name:  Jade Harris   DOB:  1965/12/18   MRN:  [id number] ? ? ?Chief Complaint: Headache ? ?Neck Pain  ?This is a chronic problem. The problem has been gradually worsening. The pain is associated with nothing. The pain is present in the left side. The quality of the pain is described as shooting and stabbing. The pain is at a severity of 10/10. The pain is severe. The pain is Worse during the night. Associated symptoms include headaches and numbness (tingling). Pertinent negatives include no chest pain, fever or weakness. She has tried NSAIDs and muscle relaxants for the symptoms. The treatment provided no relief.  ?Pain starts behind her left ear and shoots over the ear, across her face and feels like it is stabbing her left eye.  She has had an eye exam recently - no retinal changes noted. ? ?Lab Results  ?Component Value Date  ? NA 142 12/05/2020  ? K 4.7 12/05/2020  ? CO2 22 12/05/2020  ? GLUCOSE 95 12/05/2020  ? BUN 14 12/05/2020  ? CREATININE 0.81 12/05/2020  ? CALCIUM 10.4 (H) 12/05/2020  ? EGFR 85 12/05/2020  ? GFRNONAA 99 01/25/2020  ? ?Lab Results  ?Component Value Date  ? CHOL 276 (H) 12/05/2020  ? HDL 85 12/05/2020  ? LDLCALC 166 (H) 12/05/2020  ? TRIG 141 12/05/2020  ? CHOLHDL 3.2 12/05/2020  ? ?Lab Results  ?Component Value Date  ? TSH 1.070 12/05/2020  ? ?Lab Results  ?Component Value Date  ? HGBA1C 5.2 12/05/2020  ? ?Lab Results  ?Component Value Date  ? WBC 7.1 12/05/2020  ? HGB 14.3 12/05/2020  ? HCT 43.6 12/05/2020  ? MCV 89 12/05/2020  ? PLT 291 12/05/2020  ? ?Lab Results  ?Component Value Date  ? ALT 10 12/05/2020  ? AST 14 12/05/2020  ? ALKPHOS 79 12/05/2020  ? BILITOT 0.3 12/05/2020  ? ?Lab Results  ?Component Value Date  ? 79XTAVWP7 <1.0 01/25/2020  ? 25OHVITD3 29 01/25/2020  ? VD25OH 29.0 (L) 12/05/2020  ?  ? ?Review of Systems  ?Constitutional:  Negative for chills, fatigue and fever.  ?Respiratory:  Negative for chest tightness and shortness of breath.    ?Cardiovascular:  Negative for chest pain.  ?Musculoskeletal:  Positive for neck pain.  ?Neurological:  Positive for numbness (tingling) and headaches. Negative for dizziness, tremors and weakness.  ? ?Patient Active Problem List  ? Diagnosis Date Noted  ? S/P TAH (total abdominal hysterectomy) 12/04/2020  ? Chronic sacroiliac joint pain (Right) 02/24/2020  ? Other spondylosis, sacral and sacrococcygeal region 02/24/2020  ? Enthesopathy of sacroiliac joint (Right) 02/24/2020  ? Vitamin D insufficiency 02/03/2020  ? Lumbar facet hypertrophy 02/03/2020  ? Cervical facet hypertrophy (Multilevel) (Bilateral) 02/03/2020  ? 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 History of marijuana use 02/03/2020  ? Spondylosis without myelopathy or radiculopathy, lumbosacral region 02/03/2020  ? Chronic low back pain (1ry area of Pain) (Bilateral) (R>L) w/o sciatica 01/25/2020  ? Lumbosacral facet syndrome (Bilateral) (R>L) 01/25/2020  ? Chronic musculoskeletal pain 01/25/2020  ? Chronic lower extremity pain (2ry area of Pain) (Right) 01/25/2020  ? Chronic groin pain (Intermittent) (Right) 01/25/2020  ? Lumbosacral radiculopathy/radiculitis at L2 (Right) 01/25/2020  ? Chronic neck pain (Bilateral) (L>R) 01/25/2020  ? Cervicalgia 01/25/2020  ? Cervicogenic headache (Left) 01/25/2020  ? Cervico-occipital neuralgia (Left) 01/25/2020  ? Radiculitis involving upper extremity (Left) 01/25/2020  ? Numbness and tingling of upper extremity (Left)  01/25/2020  ? DDD (degenerative disc disease), cervical 01/25/2020  ? DDD (degenerative disc disease), lumbosacral 01/25/2020  ? Discogenic low back pain 01/25/2020  ? Chronic pain syndrome 01/24/2020  ? Disorder of skeletal system 01/24/2020  ? Slow transit constipation 11/25/2018  ? Venous insufficiency of both lower extremities 11/25/2018  ? Eczema 08/23/2017  ? Headache, chronic daily 09/20/2015  ? Dyslipidemia 07/04/2014  ? Depression, major, single episode, in  partial remission ([place]) 07/04/2014  ? Menopause 07/04/2014  ? 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  ? Idiopathic insomnia 07/04/2014  ? ? ?Allergies  ?Allergen Reactions  ? Sulfa Antibiotics Other (See Comments)  ? ? ?Past Surgical History:  ?Procedure Laterality Date  ? APPENDECTOMY    ? MOUTH SURGERY    ? NASAL SINUS SURGERY  04/2014  ? TONSILLECTOMY    ? TOTAL ABDOMINAL HYSTERECTOMY W/ BILATERAL SALPINGOOPHORECTOMY    ? wtih oophorectomy  ? ? ?Social History  ? ?Tobacco Use  ? Smoking status: Former  ?  Types: Cigarettes  ?  Quit date: 1989  ?  Years since quitting: 34.3  ? Smokeless tobacco: Never  ?Vaping Use  ? Vaping Use: Never used  ?Substance Use Topics  ? Alcohol use: Yes  ?  Alcohol/week: 0.0 standard drinks  ?  Comment: occasional  ? Drug use: No  ? ? ? ?Medication list has been reviewed and updated. ? ?Current Meds  ?Medication Sig  ? acetaminophen (TYLENOL) 500 MG tablet Take 500 mg by mouth every 6 (six) hours as needed.  ? B Complex Vitamins (B COMPLEX PO) Take 1 tablet by mouth daily.  ? Calcipotriene-Betameth Diprop 0.005-0.064 % FOAM Apply topically as needed. Eczema  ? diphenhydrAMINE (BENADRYL) 25 MG tablet Take 25 mg by mouth every 6 (six) hours as needed.  ? DULoxetine (CYMBALTA) 30 MG capsule Take 3 capsules (90 mg total) by mouth daily.  ? estradiol (ESTRACE) 1 MG tablet TAKE 1 TABLET BY MOUTH DAILY  ? fluticasone (FLONASE) 50 MCG/ACT nasal spray USE 2 SPRAYS IN EACH NOSTRIL DAILY  ? fluticasone-salmeterol (ADVAIR) 100-50 MCG/ACT AEPB Inhale 1 puff into the lungs 2 (two) times daily.  ? gabapentin (NEURONTIN) 300 MG capsule Take 1 capsule (300 mg total) by mouth 2 (two) times daily.  ? ibuprofen (ADVIL) 400 MG tablet Take 400 mg by mouth every 6 (six) hours as needed.  ? Naproxen Sodium (ALEVE PO) Take by mouth daily.  ? triamterene-hydrochlorothiazide (MAXZIDE-25) 37.5-25 MG tablet Take 1.5 tablets by mouth daily. (Patient taking differently: Take 1.5 tablets by mouth as  needed.)  ? ? ? ?  06/08/2021  ?  9:33 AM 12/26/2020  ? 10:49 AM 12/05/2020  ? 10:35 AM 08/08/2020  ?  4:08 PM  ?GAD 7 : Generalized Anxiety Score  ?Nervous, Anxious, on Edge 1 0 0 0  ?Control/stop worrying 1 0 0 0  ?Worry too much - different things 1 0 0 0  ?Trouble relaxing 1 0 0 1  ?Restless 1 0 0 0  ?Easily annoyed or irritable 1 0 0 1  ?Afraid - awful might happen 1 0 0 0  ?Total GAD 7 Score 7 0 0 2  ?Anxiety Difficulty Somewhat difficult Not difficult at all Not difficult at all Not difficult at all  ? ? ? ?  06/08/2021  ?  9:33 AM  ?Depression screen PHQ 2/9  ?Decreased Interest 2  ?Down, Depressed, Hopeless 2  ?PHQ - 2 Score 4  ?Altered sleeping 2  ?Tired, decreased energy 2  ?  Change in appetite 2  ?Feeling bad or failure about yourself  2  ?Trouble concentrating 2  ?Moving slowly or fidgety/restless 2  ?Suicidal thoughts 0  ?PHQ-9 Score 16  ?Difficult doing work/chores Very difficult  ? ? ?BP Readings from Last 3 Encounters:  ?06/08/21 124/84  ?12/26/20 128/78  ?12/05/20 128/78  ? ? ?Physical Exam ?Vitals and nursing note reviewed.  ?Constitutional:   ?   General: She is not in acute distress. ?   Appearance: She is well-developed.  ?HENT:  ?   Head: Normocephalic and atraumatic.  ?Cardiovascular:  ?   Rate and Rhythm: Normal rate and regular rhythm.  ?Pulmonary:  ?   Effort: Pulmonary effort is normal. No respiratory distress.  ?Abdominal:  ?   General: There is no distension.  ?   Palpations: There is no mass.  ?Musculoskeletal:  ?   Cervical back: Spasms and bony tenderness present. Decreased range of motion.  ?Skin: ?   General: Skin is warm and dry.  ?   Findings: No rash.  ?Neurological:  ?   Mental Status: She is alert and oriented to person, place, and time.  ?Psychiatric:     ?   Mood and Affect: Mood normal.     ?   Behavior: Behavior normal.  ? ? ?Wt Readings from Last 3 Encounters:  ?06/08/21 212 lb (96.2 kg)  ?12/26/20 209 lb (94.8 kg)  ?12/05/20 202 lb 3.2 oz (91.7 kg)  ? ? ?BP 124/84 (BP  Location: Right Arm, Cuff Size: Large)   Pulse 63   Ht '5\' 5"'  (1.651 m)   Wt 212 lb (96.2 kg)   SpO2 98%   BMI 35.28 kg/m?  ? ?Assessment and Plan: ?1. 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

## 2021-06-13 ENCOUNTER — Encounter: Payer: Self-pay | Admitting: Internal Medicine

## 2021-06-20 ENCOUNTER — Ambulatory Visit: Payer: BC Managed Care – PPO | Admitting: Internal Medicine

## 2021-06-20 ENCOUNTER — Encounter: Payer: Self-pay | Admitting: Internal Medicine

## 2021-06-20 VITALS — BP 132/62 | HR 76 | Ht 65.0 in | Wt 213.2 lb

## 2021-06-20 DIAGNOSIS — Z9109 Other allergy status, other than to drugs and biological substances: Secondary | ICD-10-CM | POA: Diagnosis not present

## 2021-06-20 DIAGNOSIS — M4802 Spinal stenosis, cervical region: Secondary | ICD-10-CM | POA: Diagnosis not present

## 2021-06-20 MED ORDER — ALBUTEROL SULFATE HFA 108 (90 BASE) MCG/ACT IN AERS: 2.0000 | INHALATION_SPRAY | Freq: Four times a day (QID) | RESPIRATORY_TRACT | 2 refills | Status: DC | PRN

## 2021-06-20 MED ORDER — EPINEPHRINE 0.3 MG/0.3ML IJ SOAJ: 0.3000 mg | INTRAMUSCULAR | 1 refills | Status: AC | PRN

## 2021-06-20 NOTE — Progress Notes (Signed)
Date:  06/20/2021   Name:  Jade Harris   DOB:  03/15/1965   MRN:  [id number]   Chief Complaint: No chief complaint on file.  Allergic Reaction This is a new problem. The current episode started 5 to 7 days ago. The problem has been resolved since onset. The problem is moderate. Associated with: Weeds. The time of exposure was just prior to onset. Associated symptoms include difficulty breathing, eye itching, eye redness, globus sensation, itching, a rash, stridor and trouble swallowing. Swelling is present on the eyes.  Neck Pain  This is a chronic problem. The problem has been gradually improving (85% better with gabapentin). Associated symptoms include trouble swallowing.   Lab Results  Component Value Date   NA 142 12/05/2020   K 4.7 12/05/2020   CO2 22 12/05/2020   GLUCOSE 95 12/05/2020   BUN 14 12/05/2020   CREATININE 0.81 12/05/2020   CALCIUM 10.4 (H) 12/05/2020   EGFR 85 12/05/2020   GFRNONAA 99 01/25/2020   Lab Results  Component Value Date   CHOL 276 (H) 12/05/2020   HDL 85 12/05/2020   LDLCALC 166 (H) 12/05/2020   TRIG 141 12/05/2020   CHOLHDL 3.2 12/05/2020   Lab Results  Component Value Date   TSH 1.070 12/05/2020   Lab Results  Component Value Date   HGBA1C 5.2 12/05/2020   Lab Results  Component Value Date   WBC 7.1 12/05/2020   HGB 14.3 12/05/2020   HCT 43.6 12/05/2020   MCV 89 12/05/2020   PLT 291 12/05/2020   Lab Results  Component Value Date   ALT 10 12/05/2020   AST 14 12/05/2020   ALKPHOS 79 12/05/2020   BILITOT 0.3 12/05/2020   Lab Results  Component Value Date   25OHVITD2 <1.0 01/25/2020   25OHVITD3 29 01/25/2020   VD25OH 29.0 (L) 12/05/2020     Review of Systems  HENT:  Positive for trouble swallowing.   Eyes:  Positive for redness and itching.  Respiratory:  Positive for stridor.   Musculoskeletal:  Positive for neck pain.  Skin:  Positive for itching and rash.   Patient Active Problem List   Diagnosis Date Noted   . Environmental allergies 06/20/2021  . S/P TAH (total abdominal hysterectomy) 12/04/2020  . Chronic sacroiliac joint pain (Right) 02/24/2020  . Other spondylosis, sacral and sacrococcygeal region 02/24/2020  . Enthesopathy of sacroiliac joint (Right) 02/24/2020  . Vitamin D insufficiency 02/03/2020  . Lumbar facet hypertrophy 02/03/2020  . Cervical facet hypertrophy (Multilevel) (Bilateral) 02/03/2020  . 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 History of marijuana use 02/03/2020  . Spondylosis without myelopathy or radiculopathy, lumbosacral region 02/03/2020  . Chronic low back pain (1ry area of Pain) (Bilateral) (R>L) w/o sciatica 01/25/2020  . Lumbosacral facet syndrome (Bilateral) (R>L) 01/25/2020  . Chronic musculoskeletal pain 01/25/2020  . Chronic lower extremity pain (2ry area of Pain) (Right) 01/25/2020  . Chronic groin pain (Intermittent) (Right) 01/25/2020  . Lumbosacral radiculopathy/radiculitis at L2 (Right) 01/25/2020  . Chronic neck pain (Bilateral) (L>R) 01/25/2020  . Cervicalgia 01/25/2020  . Cervicogenic headache (Left) 01/25/2020  . Cervico-occipital neuralgia (Left) 01/25/2020  . Radiculitis involving upper extremity (Left) 01/25/2020  . Numbness and tingling of upper extremity (Left) 01/25/2020  . DDD (degenerative disc disease), cervical 01/25/2020  . DDD (degenerative disc disease), lumbosacral 01/25/2020  . Discogenic low back pain 01/25/2020  . Chronic pain syndrome 01/24/2020  . Disorder of skeletal system 01/24/2020  . Slow transit  constipation 11/25/2018  . Venous insufficiency of both lower extremities 11/25/2018  . Eczema 08/23/2017  . Headache, chronic daily 09/20/2015  . Dyslipidemia 07/04/2014  . Depression, major, single episode, in partial remission ([place]) 07/04/2014  . Menopause 07/04/2014  . 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  . Idiopathic insomnia 07/04/2014    Allergies  Allergen  Reactions  . Sulfa Antibiotics Other (See Comments)    Past Surgical History:  Procedure Laterality Date  . APPENDECTOMY    . MOUTH SURGERY    . NASAL SINUS SURGERY  04/2014  . TONSILLECTOMY    . TOTAL ABDOMINAL HYSTERECTOMY W/ BILATERAL SALPINGOOPHORECTOMY     wtih oophorectomy    Social History   Tobacco Use  . Smoking status: Former    Types: Cigarettes    Quit date: 1989    Years since quitting: 34.4  . Smokeless tobacco: Never  Vaping Use  . Vaping Use: Never used  Substance Use Topics  . Alcohol use: Yes    Alcohol/week: 0.0 standard drinks    Comment: occasional  . Drug use: No     Medication list has been reviewed and updated.  Current Meds  Medication Sig  . albuterol (VENTOLIN HFA) 108 (90 Base) MCG/ACT inhaler Inhale 2 puffs into the lungs every 6 (six) hours as needed for wheezing or shortness of breath.  . EPINEPHrine (EPIPEN 2-PAK) 0.3 mg/0.3 mL IJ SOAJ injection Inject 0.3 mg into the muscle as needed for anaphylaxis.       06/20/2021    2:31 PM 06/08/2021    9:33 AM 12/26/2020   10:49 AM 12/05/2020   10:35 AM  GAD 7 : Generalized Anxiety Score  Nervous, Anxious, on Edge 0 1 0 0  Control/stop worrying 0 1 0 0  Worry too much - different things 0 1 0 0  Trouble relaxing 0 1 0 0  Restless 0 1 0 0  Easily annoyed or irritable 0 1 0 0  Afraid - awful might happen 0 1 0 0  Total GAD 7 Score 0 7 0 0  Anxiety Difficulty Not difficult at all Somewhat difficult Not difficult at all Not difficult at all       06/20/2021    2:31 PM  Depression screen PHQ 2/9  Decreased Interest 0  Down, Depressed, Hopeless 0  PHQ - 2 Score 0  Altered sleeping 0  Tired, decreased energy 3  Change in appetite 3  Feeling bad or failure about yourself  0  Trouble concentrating 0  Moving slowly or fidgety/restless 0  Suicidal thoughts 0  PHQ-9 Score 6    BP Readings from Last 3 Encounters:  06/20/21 132/62  06/08/21 124/84  12/26/20 128/78    Physical  Exam Vitals and nursing note reviewed.  Constitutional:      General: She is not in acute distress.    Appearance: She is well-developed.  HENT:     Head: Normocephalic and atraumatic.  Cardiovascular:     Rate and Rhythm: Normal rate and regular rhythm.     Pulses: Normal pulses.  Pulmonary:     Effort: Pulmonary effort is normal. No respiratory distress.     Breath sounds: No wheezing or rhonchi.  Musculoskeletal:     Cervical back: Normal range of motion and neck supple.  Lymphadenopathy:     Cervical: No cervical adenopathy.  Skin:    General: Skin is warm and dry.     Findings: No rash.  Neurological:  Mental Status: She is alert and oriented to person, place, and time.  Psychiatric:        Mood and Affect: Mood normal.        Behavior: Behavior normal.    Wt Readings from Last 3 Encounters:  06/20/21 213 lb 3.2 oz (96.7 kg)  06/08/21 212 lb (96.2 kg)  12/26/20 209 lb (94.8 kg)    BP 132/62   Pulse 76   Ht '5\' 5"'  (1.651 m)   Wt 213 lb 3.2 oz (96.7 kg)   SpO2 97%   BMI 35.48 kg/m   Assessment and Plan: 1. Environmental allergies Facial swelling and chest tightness after exposure to tall grasses Will give Epipen to use for severe symptoms Albuterol for wheezing - EPINEPHrine (EPIPEN 2-PAK) 0.3 mg/0.3 mL IJ SOAJ injection; Inject 0.3 mg into the muscle as needed for anaphylaxis.  Dispense: 2 each; Refill: 1 - albuterol (VENTOLIN HFA) 108 (90 Base) MCG/ACT inhaler; Inhale 2 puffs into the lungs every 6 (six) hours as needed for wheezing or shortness of breath.  Dispense: 8 g; Refill: 2  2. 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   Partially dictated using Editor, commissioning. Any errors are unintentional.  Halina Maidens, MD [facility]  06/20/2021

## 2021-07-03 ENCOUNTER — Other Ambulatory Visit: Payer: Self-pay | Admitting: Internal Medicine

## 2021-07-04 ENCOUNTER — Other Ambulatory Visit: Payer: Self-pay | Admitting: Internal Medicine

## 2021-07-04 NOTE — Telephone Encounter (Signed)
Requested Prescriptions  Pending Prescriptions Disp Refills  . 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

## 2021-07-04 NOTE — Telephone Encounter (Signed)
Receipt confirmed by pharmacy 07/04/21 at 1:18 Requested Prescriptions  Pending Prescriptions Disp Refills  . 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 Glean Hess, MD   10 months ago Acute conjunctivitis of both eyes, unspecified acute conjunctivitis type   [facility] Glean Hess, MD      Future Appointments            In 5 months Army Melia Jesse Sans, MD [facility], [facility]

## 2021-07-04 NOTE — Telephone Encounter (Signed)
Requested medication (s) are due for refill today:yes  Requested medication (s) are on the active medication list: yes    Last refill: 01/24/21  #30  1 refill  Future visit scheduled yes 11/1021  Notes to clinic:failed due to Mammogram due. Please review.  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)   [facility] Glean Hess, MD   6 months ago Acute cystitis without hematuria   [facility] Glean Hess, MD   7 months ago Annual physical exam   [facility] Glean Hess, MD   10 months ago Acute conjunctivitis of both eyes, unspecified acute conjunctivitis type   [facility] Glean Hess, MD       Future Appointments             In 5 months Glean Hess, MD [facility]             Signed Prescriptions Disp Refills   fluticasone (FLONASE) 50 MCG/ACT nasal spray 16 g 5    Sig: SHAKE LIQUID AND USE 2 SPRAYS IN EACH NOSTRIL DAILY     Ear, Nose, and Throat: Nasal Preparations - Corticosteroids Passed - 07/03/2021 10:53 AM      Passed - Valid encounter within last 12 months    Recent Outpatient Visits           2 weeks ago Environmental allergies   [facility] Glean Hess, MD   3 weeks ago 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 Glean Hess, MD   7 months ago Annual physical exam   [facility] Glean Hess, MD   10 months ago Acute conjunctivitis of both eyes, unspecified acute conjunctivitis type   [facility] Glean Hess, MD       Future Appointments             In 5 months Army Melia Jesse Sans, MD [facility], [facility]

## 2021-07-05 ENCOUNTER — Other Ambulatory Visit: Payer: Self-pay | Admitting: Internal Medicine

## 2021-07-05 DIAGNOSIS — M4802 Spinal stenosis, cervical region: Secondary | ICD-10-CM

## 2021-07-05 NOTE — Telephone Encounter (Signed)
Refused Gabapentin 300 mg caplets because requested too early.

## 2021-07-11 ENCOUNTER — Ambulatory Visit: Payer: BC Managed Care – PPO | Admitting: Internal Medicine

## 2021-07-11 ENCOUNTER — Encounter: Payer: Self-pay | Admitting: Internal Medicine

## 2021-07-11 VITALS — BP 128/70 | HR 63 | Ht 65.0 in | Wt 211.0 lb

## 2021-07-11 DIAGNOSIS — M5030 Other cervical disc degeneration, unspecified cervical region: Secondary | ICD-10-CM

## 2021-07-11 DIAGNOSIS — M5412 Radiculopathy, cervical region: Secondary | ICD-10-CM | POA: Diagnosis not present

## 2021-07-11 MED ORDER — GABAPENTIN 300 MG PO CAPS: 300.0000 mg | ORAL_CAPSULE | Freq: Three times a day (TID) | ORAL | 1 refills | Status: DC

## 2021-07-11 MED ORDER — CYCLOBENZAPRINE HCL 10 MG PO TABS: 10.0000 mg | ORAL_TABLET | Freq: Three times a day (TID) | ORAL | 0 refills | Status: DC | PRN

## 2021-07-11 NOTE — Progress Notes (Signed)
Date:  07/11/2021   Name:  Jade Harris   DOB:  11/23/1965   MRN:  [id number]   Chief Complaint: Headache  Headache  This is a recurrent problem. The current episode started 1 to 4 weeks ago. The problem occurs intermittently. The problem has been waxing and waning. The pain is located in the Temporal (Aways the left side) region. Radiates to: tingling down into left arm. The quality of the pain is described as shooting and sharp. The pain is at a severity of 4/10. The pain is mild. Associated symptoms include neck pain, numbness and tingling. Pertinent negatives include no fever or weakness.  Neck Pain  This is a chronic problem. The problem has been gradually worsening. The pain is associated with nothing. The pain is present in the left side and midline. The quality of the pain is described as burning and cramping. The pain is moderate. The symptoms are aggravated by twisting, coughing and sneezing. Associated symptoms include headaches, numbness and tingling. Pertinent negatives include no chest pain, fever, paresis or weakness. Treatments tried: gabapentin. Improvement on treatment: intially had benefit, now worsening again.    Lab Results  Component Value Date   NA 142 12/05/2020   K 4.7 12/05/2020   CO2 22 12/05/2020   GLUCOSE 95 12/05/2020   BUN 14 12/05/2020   CREATININE 0.81 12/05/2020   CALCIUM 10.4 (H) 12/05/2020   EGFR 85 12/05/2020   GFRNONAA 99 01/25/2020   Lab Results  Component Value Date   CHOL 276 (H) 12/05/2020   HDL 85 12/05/2020   LDLCALC 166 (H) 12/05/2020   TRIG 141 12/05/2020   CHOLHDL 3.2 12/05/2020   Lab Results  Component Value Date   TSH 1.070 12/05/2020   Lab Results  Component Value Date   HGBA1C 5.2 12/05/2020   Lab Results  Component Value Date   WBC 7.1 12/05/2020   HGB 14.3 12/05/2020   HCT 43.6 12/05/2020   MCV 89 12/05/2020   PLT 291 12/05/2020   Lab Results  Component Value Date   ALT 10 12/05/2020   AST 14 12/05/2020    ALKPHOS 79 12/05/2020   BILITOT 0.3 12/05/2020   Lab Results  Component Value Date   25OHVITD2 <1.0 01/25/2020   25OHVITD3 29 01/25/2020   VD25OH 29.0 (L) 12/05/2020     Review of Systems  Constitutional:  Negative for chills, fatigue and fever.  Respiratory:  Negative for chest tightness and shortness of breath.   Cardiovascular:  Negative for chest pain.  Musculoskeletal:  Positive for arthralgias and neck pain.  Neurological:  Positive for tingling, numbness and headaches. Negative for weakness.  Psychiatric/Behavioral:  Positive for dysphoric mood. Negative for sleep disturbance. The patient is not nervous/anxious.     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) 02/03/2020   History of marijuana use 02/03/2020   Spondylosis without myelopathy or radiculopathy, lumbosacral region 02/03/2020   Chronic low back pain (1ry area of Pain) (Bilateral) (R>L) w/o sciatica 01/25/2020   Lumbosacral facet syndrome (Bilateral) (R>L) 01/25/2020   Chronic musculoskeletal pain 01/25/2020   Chronic  lower extremity pain (2ry area of Pain) (Right) 01/25/2020   Chronic groin pain (Intermittent) (Right) 01/25/2020   Lumbosacral radiculopathy/radiculitis at L2 (Right) 01/25/2020   Chronic neck pain (Bilateral) (L>R) 01/25/2020   Cervicalgia 01/25/2020   Cervicogenic headache (Left) 01/25/2020   Cervico-occipital neuralgia (Left) 01/25/2020   Radiculitis involving upper extremity (Left) 01/25/2020   Numbness and tingling of upper  extremity (Left) 01/25/2020   DDD (degenerative disc disease), cervical 01/25/2020   DDD (degenerative disc disease), lumbosacral 01/25/2020   Discogenic low back pain 01/25/2020   Chronic pain syndrome 01/24/2020   Disorder of skeletal system 01/24/2020   Slow transit constipation 11/25/2018   Venous insufficiency of both lower extremities 11/25/2018   Eczema 08/23/2017   Headache, chronic daily 09/20/2015   Dyslipidemia 07/04/2014   Depression, major, single episode, in partial remission ([place]) 07/04/2014   Menopause 07/04/2014   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   Idiopathic insomnia 07/04/2014    Allergies  Allergen Reactions   Sulfa Antibiotics Other (See Comments)    Past Surgical History:  Procedure Laterality Date   APPENDECTOMY     MOUTH SURGERY     NASAL SINUS SURGERY  04/2014   TONSILLECTOMY     TOTAL ABDOMINAL HYSTERECTOMY W/ BILATERAL SALPINGOOPHORECTOMY     wtih oophorectomy    Social History   Tobacco Use   Smoking status: Former    Types: Cigarettes    Quit date: 1989    Years since quitting: 34.4   Smokeless tobacco: Never  Vaping Use   Vaping Use: Never used  Substance Use Topics   Alcohol use: Yes    Alcohol/week: 0.0 standard drinks of alcohol    Comment: occasional   Drug use: No     Medication list has been reviewed and updated.  Current Meds  Medication Sig   acetaminophen (TYLENOL) 500 MG tablet Take 500 mg by mouth every 6 (six) hours as needed.   albuterol (VENTOLIN HFA) 108 (90 Base) MCG/ACT inhaler Inhale 2 puffs into the lungs every 6 (six) hours as needed for wheezing or shortness of breath.   B Complex Vitamins (B COMPLEX PO) Take 1 tablet by mouth daily.   Calcipotriene-Betameth Diprop 0.005-0.064 % FOAM Apply topically as needed. Eczema   diphenhydrAMINE (BENADRYL) 25 MG tablet Take 25 mg by mouth every 6 (six) hours as needed.   DULoxetine (CYMBALTA) 30 MG capsule Take 3 capsules (90 mg total) by mouth daily.    EPINEPHrine (EPIPEN 2-PAK) 0.3 mg/0.3 mL IJ SOAJ injection Inject 0.3 mg into the muscle as needed for anaphylaxis.   estradiol (ESTRACE) 1 MG tablet TAKE 1 TABLET BY MOUTH DAILY   fluticasone (FLONASE) 50 MCG/ACT nasal spray SHAKE LIQUID AND USE 2 SPRAYS IN EACH NOSTRIL DAILY   fluticasone-salmeterol (ADVAIR) 100-50 MCG/ACT AEPB Inhale 1 puff into the lungs 2 (two) times daily.   ibuprofen (ADVIL) 400 MG tablet Take 400 mg by mouth every 6 (six) hours as needed.   Naproxen Sodium (ALEVE PO) Take by mouth daily.   triamterene-hydrochlorothiazide (MAXZIDE-25) 37.5-25 MG tablet Take 1.5 tablets by mouth daily. (Patient taking differently: Take 1.5 tablets by mouth as needed.)   [DISCONTINUED] gabapentin (NEURONTIN) 300 MG capsule Take 1 capsule (300 mg total) by mouth 2 (two) times daily.       06/20/2021    2:31 PM 06/08/2021    9:33 AM 12/26/2020   10:49 AM 12/05/2020   10:35 AM  GAD 7 : Generalized Anxiety Score  Nervous, Anxious, on Edge  0 1 0 0  Control/stop worrying 0 1 0 0  Worry too much - different things 0 1 0 0  Trouble relaxing 0 1 0 0  Restless 0 1 0 0  Easily annoyed or irritable 0 1 0 0  Afraid - awful might happen 0 1 0 0  Total GAD 7 Score 0 7 0 0  Anxiety Difficulty Not difficult at all Somewhat difficult Not difficult at all Not difficult at all       06/20/2021    2:31 PM  Depression screen PHQ 2/9  Decreased Interest 0  Down, Depressed, Hopeless 0  PHQ - 2 Score 0  Altered sleeping 0  Tired, decreased energy 3  Change in appetite 3  Feeling bad or failure about yourself  0  Trouble concentrating 0  Moving slowly or fidgety/restless 0  Suicidal thoughts 0  PHQ-9 Score 6    BP Readings from Last 3 Encounters:  07/11/21 128/70  06/20/21 132/62  06/08/21 124/84    Physical Exam Constitutional:      Appearance: She is well-developed.  Cardiovascular:     Rate and Rhythm: Normal rate and regular rhythm.  Pulmonary:     Effort: Pulmonary effort is  normal.     Breath sounds: No wheezing or rhonchi.  Musculoskeletal:     Cervical back: Spasms and tenderness present. Pain with movement present. Decreased range of motion.  Neurological:     Mental Status: She is alert.     Sensory: Sensation is intact.     Motor: Weakness (mild decrease in left grip strength 4+/5) present.     Deep Tendon Reflexes:     Reflex Scores:      Bicep reflexes are 2+ on the right side and 2+ on the left side.    Wt Readings from Last 3 Encounters:  07/11/21 211 lb (95.7 kg)  06/20/21 213 lb 3.2 oz (96.7 kg)  06/08/21 212 lb (96.2 kg)    BP 128/70   Pulse 63   Ht '5\' 5"'  (1.651 m)   Wt 211 lb (95.7 kg)   SpO2 98%   BMI 35.11 kg/m   Assessment and Plan: 1. DDD (degenerative disc disease), cervical Will increase gabapentin to 300 mg tid Add Flexeril at bedtime - cyclobenzaprine (FLEXERIL) 10 MG tablet; Take 1 tablet (10 mg total) by mouth 3 (three) times daily as needed for muscle spasms.  Dispense: 30 tablet; Refill: 0 - gabapentin (NEURONTIN) 300 MG capsule; Take 1 capsule (300 mg total) by mouth 3 (three) times daily.  Dispense: 90 capsule; Refill: 1 - MR Cervical Spine Wo Contrast; Future  2. Cervical radiculopathy Now with tingling and numbness in left arm Neurovascular intact Recommend MRI to further evaluate - MR Cervical Spine Wo Contrast; Future   Partially dictated using Dragon software. Any errors are unintentional.  Halina Maidens, MD [facility]  07/11/2021

## 2021-08-03 ENCOUNTER — Other Ambulatory Visit: Payer: Self-pay | Admitting: Internal Medicine

## 2021-08-03 DIAGNOSIS — F324 Major depressive disorder, single episode, in partial remission: Secondary | ICD-10-CM

## 2021-08-03 NOTE — Telephone Encounter (Signed)
Requested Prescriptions  Pending Prescriptions Disp Refills  . DULoxetine (CYMBALTA) 30 MG capsule [Pharmacy Med Name: DULOXETINE DR 30MG CAPSULES] 90 capsule 5    Sig: TAKE 3 CAPSULES(90 MG) BY MOUTH DAILY     Psychiatry: Antidepressants - SNRI - duloxetine Passed - 08/03/2021  8:31 AM      Passed - Cr in normal range and within 360 days    Creatinine, Ser  Date Value Ref Range Status  12/05/2020 0.81 0.57 - 1.00 mg/dL Final         Passed - eGFR is 30 or above and within 360 days    GFR calc Af Amer  Date Value Ref Range Status  01/25/2020 114 >59 mL/min/1.73 Final    Comment:    **In accordance with recommendations from the NKF-ASN Task force,**   [facility] is in the process of updating its eGFR calculation to the   2021 CKD-EPI creatinine equation that estimates kidney function   without a race variable.    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

## 2021-08-07 ENCOUNTER — Other Ambulatory Visit: Payer: Self-pay | Admitting: Internal Medicine

## 2021-08-07 DIAGNOSIS — M5030 Other cervical disc degeneration, unspecified cervical region: Secondary | ICD-10-CM

## 2021-08-07 MED ORDER — CYCLOBENZAPRINE HCL 10 MG PO TABS: 10.0000 mg | ORAL_TABLET | Freq: Three times a day (TID) | ORAL | 0 refills | Status: DC | PRN

## 2021-08-08 ENCOUNTER — Other Ambulatory Visit: Payer: Self-pay | Admitting: Internal Medicine

## 2021-08-08 NOTE — Telephone Encounter (Signed)
Requested Prescriptions  Pending Prescriptions Disp Refills  . triamterene-hydrochlorothiazide (MAXZIDE-25) 37.5-25 MG tablet [Pharmacy Med Name: TRIAMTERENE 37.'5MG'$ / HCTZ '25MG'$  TABS] 135 tablet 1    Sig: Take 1.5 tablets by mouth as needed.     Cardiovascular: Diuretic Combos Failed - 08/07/2021  9:14 AM      Failed - K in normal range and within 180 days    Potassium  Date Value Ref Range Status  12/05/2020 4.7 3.5 - 5.2 mmol/L Final         Failed - Na in normal range and within 180 days    Sodium  Date Value Ref Range Status  12/05/2020 142 134 - 144 mmol/L Final         Failed - Cr in normal range and within 180 days    Creatinine, Ser  Date Value Ref Range Status  12/05/2020 0.81 0.57 - 1.00 mg/dL Final         Passed - Last BP in normal range    BP Readings from Last 1 Encounters:  07/11/21 128/70         Passed - Valid encounter within last 6 months    Recent Outpatient Visits          4 weeks ago DDD (degenerative disc disease), cervical   [facility] Glean Hess, MD   1 month ago Environmental allergies   [facility] Glean Hess, MD   2 months ago 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 estradiol (ESTRACE) 1 MG tablet [Pharmacy Med Name: ESTRADIOL '1MG'$  TABLETS] 90 tablet 1    Sig: TAKE 1 TABLET BY MOUTH DAILY     OB/GYN:  Estrogens Failed - 08/07/2021  9:14 AM      Failed - Mammogram is up-to-date per Health Maintenance      Passed - Last BP in normal range    BP Readings from Last 1 Encounters:  07/11/21 128/70         Passed - Valid encounter within last 12 months     Recent Outpatient Visits          4 weeks ago DDD (degenerative disc disease), cervical   [facility] Glean Hess, MD   1 month ago Environmental allergies   [facility] Glean Hess, MD   2 months ago 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

## 2021-08-09 NOTE — Telephone Encounter (Signed)
Refilled 08/08/21. Requested Prescriptions  Refused Prescriptions Disp Refills  . estradiol (ESTRACE) 1 MG tablet [Pharmacy Med Name: ESTRADIOL '1MG'$  TABLETS] 90 tablet     Sig: TAKE 1 TABLET BY MOUTH DAILY     OB/GYN:  Estrogens Failed - 08/08/2021  8:00 AM      Failed - Mammogram is up-to-date per Health Maintenance      Passed - Last BP in normal range    BP Readings from Last 1 Encounters:  07/11/21 128/70         Passed - Valid encounter within last 12 months    Recent Outpatient Visits          4 weeks ago DDD (degenerative disc disease), cervical   [facility] Glean Hess, MD   1 month ago Environmental allergies   [facility] Glean Hess, MD   2 months ago 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

## 2021-08-14 ENCOUNTER — Ambulatory Visit
Admission: RE | Admit: 2021-08-14 | Discharge: 2021-08-14 | Disposition: A | Discharge status: Home / Self Care | Payer: BC Managed Care – PPO | Source: Ambulatory Visit | Attending: Internal Medicine | Admitting: Internal Medicine

## 2021-08-14 ENCOUNTER — Other Ambulatory Visit: Payer: Self-pay

## 2021-08-14 DIAGNOSIS — M5030 Other cervical disc degeneration, unspecified cervical region: Secondary | ICD-10-CM

## 2021-08-14 DIAGNOSIS — M4802 Spinal stenosis, cervical region: Secondary | ICD-10-CM

## 2021-08-14 DIAGNOSIS — M5412 Radiculopathy, cervical region: Secondary | ICD-10-CM

## 2021-08-14 DIAGNOSIS — M5417 Radiculopathy, lumbosacral region: Secondary | ICD-10-CM

## 2021-08-17 ENCOUNTER — Encounter: Payer: Self-pay | Admitting: Internal Medicine

## 2021-08-18 ENCOUNTER — Telehealth: Payer: Self-pay

## 2021-08-18 NOTE — Telephone Encounter (Signed)
We have received a referral from [facility] for back and neck pain. Can you verify if she is wanting to be seen for both? Upon review she has had a Cervical MRI but no recent Lumbar MRI.She has seen Dr. Dossie Arbour for her back and had ESIs. Has she had any PT for her neck or back within the past year?

## 2021-08-18 NOTE — Telephone Encounter (Signed)
Left message to call back  

## 2021-08-21 NOTE — Telephone Encounter (Signed)
Appt 09/19/2021.

## 2021-09-19 ENCOUNTER — Encounter: Payer: Self-pay | Admitting: Internal Medicine

## 2021-09-19 ENCOUNTER — Encounter: Payer: Self-pay | Admitting: Neurosurgery

## 2021-09-19 ENCOUNTER — Ambulatory Visit: Payer: BC Managed Care – PPO | Admitting: Neurosurgery

## 2021-09-19 VITALS — BP 193/118 | HR 76 | Ht 65.0 in | Wt 210.6 lb

## 2021-09-19 DIAGNOSIS — M47812 Spondylosis without myelopathy or radiculopathy, cervical region: Secondary | ICD-10-CM | POA: Diagnosis not present

## 2021-09-19 DIAGNOSIS — R519 Headache, unspecified: Secondary | ICD-10-CM

## 2021-09-19 DIAGNOSIS — M542 Cervicalgia: Secondary | ICD-10-CM

## 2021-09-19 NOTE — Progress Notes (Signed)
Referring Physician:  Glean Hess, MD [address] [place],  [place] [address]  Primary Physician:  Glean Hess, MD  History of Present Illness: 09/19/2021 [name] is a 56 y.o who is here today for further evaluation of her cervical spine.  She states that she has had several years of pain behind her left ear that radiates into her temple and behind her eye which seems to be worse around 3 or 4:00 in the afternoon.  She also endorses intermittent ringing in her left ear.  She denies any exacerbating or relieving factors or photophobia.  In addition to this she describes some left trap pain and numbness and tingling in her middle 2 fingers with intermittent numbness in her thumb and index finger.  She denies any pain that radiates down the length of her left arm or any right-sided symptoms.  This is occurred off and on for couple of years but is worsened over the last 6 months.  She has attempted chiropractic, acupuncture, dry needling, Advil, and Flexeril as well as gabapentin without any significant relief.  She has not done any recent physical therapy or had any injections.  Her primary complaint is her ear, temple, and facial pain.  She did have a history of migraines as a child  Conservative measures:  Physical therapy: none recently.  Multimodal medical therapy including regular antiinflammatories: Advil, Flexeril, gabapentin Injections: No epidural steroid injections  Past Surgery: Previous cervical spine surgeries  Jade Harris has no symptoms of cervical myelopathy.  The symptoms are causing a significant impact on the patient's life.   Review of Systems:  A 10 point review of systems is negative, except for the pertinent positives and negatives detailed in the HPI.  Past Medical History: Past Medical History:  Diagnosis Date   Cancer ([place])    skin ca    Past Surgical History: Past Surgical History:  Procedure Laterality Date    APPENDECTOMY     MOUTH SURGERY     NASAL SINUS SURGERY  04/2014   TONSILLECTOMY     TOTAL ABDOMINAL HYSTERECTOMY W/ BILATERAL SALPINGOOPHORECTOMY     wtih oophorectomy    Allergies: Allergies as of 09/19/2021 - Review Complete 09/19/2021  Allergen Reaction Noted   Sulfa antibiotics Other (See Comments) 07/04/2014    Medications: Outpatient Encounter Medications as of 09/19/2021  Medication Sig   acetaminophen (TYLENOL) 500 MG tablet Take 500 mg by mouth every 6 (six) hours as needed.   albuterol (VENTOLIN HFA) 108 (90 Base) MCG/ACT inhaler Inhale 2 puffs into the lungs every 6 (six) hours as needed for wheezing or shortness of breath.   B Complex Vitamins (B COMPLEX PO) Take 1 tablet by mouth daily.   Calcipotriene-Betameth Diprop 0.005-0.064 % FOAM Apply topically as needed. Eczema   diphenhydrAMINE (BENADRYL) 25 MG tablet Take 25 mg by mouth every 6 (six) hours as needed.   DULoxetine (CYMBALTA) 30 MG capsule TAKE 3 CAPSULES(90 MG) BY MOUTH DAILY   EPINEPHrine (EPIPEN 2-PAK) 0.3 mg/0.3 mL IJ SOAJ injection Inject 0.3 mg into the muscle as needed for anaphylaxis.   fluticasone (FLONASE) 50 MCG/ACT nasal spray SHAKE LIQUID AND USE 2 SPRAYS IN EACH NOSTRIL DAILY   fluticasone-salmeterol (ADVAIR) 100-50 MCG/ACT AEPB Inhale 1 puff into the lungs 2 (two) times daily.   gabapentin (NEURONTIN) 300 MG capsule Take 1 capsule (300 mg total) by mouth 3 (three) times daily.   ibuprofen (ADVIL) 400 MG tablet Take 400 mg by mouth  every 6 (six) hours as needed.   Naproxen Sodium (ALEVE PO) Take by mouth daily.   triamterene-hydrochlorothiazide (MAXZIDE-25) 37.5-25 MG tablet Take 1.5 tablets by mouth as needed.   [DISCONTINUED] cyclobenzaprine (FLEXERIL) 10 MG tablet Take 1 tablet (10 mg total) by mouth 3 (three) times daily as needed for muscle spasms.   [DISCONTINUED] estradiol (ESTRACE) 1 MG tablet TAKE 1 TABLET BY MOUTH DAILY   No facility-administered encounter medications on file as of  09/19/2021.    Social History: Social History   Tobacco Use   Smoking status: Former    Types: Cigarettes    Quit date: 1989    Years since quitting: 34.6   Smokeless tobacco: Never  Vaping Use   Vaping Use: Never used  Substance Use Topics   Alcohol use: Yes    Alcohol/week: 0.0 standard drinks of alcohol    Comment: occasional   Drug use: No    Family Medical History: Family History  Problem Relation Age of Onset   Hypertension Mother    Breast cancer Neg Hx     Physical Examination:  Today's Vitals   09/19/21 1344 09/19/21 1348  BP: (!) 200/118 (!) 186/124  Pulse: 75 83  Weight: 95.5 kg   Height: '5\' 5"'$  (1.651 m)   PainSc: 5    PainLoc: Ear    Body mass index is 35.05 kg/m.   General: Patient is well developed, well nourished, calm, collected, and in no apparent distress. Attention to examination is appropriate.  Psychiatric: Patient is non-anxious.  Head:  Pupils equal, round, and reactive to light.  ENT:  Oral mucosa appears well hydrated.  Neck:   Supple.   Respiratory: Patient is breathing without any difficulty.  Extremities: No edema.  Vascular: Palpable dorsal pedal pulses.  Skin:   On exposed skin, there are no abnormal skin lesions.  NEUROLOGICAL:     Awake, alert, oriented to person, place, and time.  Speech is clear and fluent. Fund of knowledge is appropriate.   Cranial Nerves: Pupils equal round and reactive to light.  Facial tone is symmetric.  Facial sensation is symmetric.  + right horizontal nystagmus ROM of spine: full.  Palpation of spine: non tender.    Strength: Side Biceps Triceps Deltoid Interossei Grip Wrist Ext. Wrist Flex.  R '5 5 5 5 5 5 5  '$ L '5 5 5 5 5 5 5   '$ Side Iliopsoas Quads Hamstring PF DF EHL  R '5 5 5 5 5 5  '$ L '5 5 5 5 5 5   '$ Reflexes are 2+ and symmetric at the biceps, triceps, brachioradialis, patella and achilles.   Hoffman's is absent.  Clonus is not present.  Toes are down-going.  Bilateral upper and  lower extremity sensation is intact to light touch.    Gait is normal.    Medical Decision Making  Imaging: MRI C spine 08/14/21 IMPRESSION: 1. Advanced cervical disc and endplate degeneration E1-D4 through C7-T1. Minor degenerative endplate marrow edema at C4-C5. 2. Mild spinal stenosis at C4-C5. Mild if any spinal cord mass effect, and no cord signal abnormality. 3. Moderate or severe neural foraminal stenosis at the right C5, bilateral C6, right C7, and bilateral C8 nerve levels.     Electronically Signed   By: Genevie Ann M.D.   On: 08/14/2021 11:45  I have personally reviewed the images and agree with the above interpretation.  Assessment and Plan: Jade Harris is a pleasant 56 y.o. female with left-sided facial pain and ear  pain concerning for possible underlying neurologic cause.  Her cervical MRI does explain why she has neck pain and some of the numbness into her hand however the findings are worse on the right than the left.  I will nevertheless send her for evaluation with physical medicine rehab to evaluate for the utility of cervical injections.  I do not think that this explains her facial pain and would like for her to see neurology in regards to this.  I have ordered an MRI of her brain for further evaluation however her symptoms could be related to atypical migraines versus other underlying neurologic cause.  I do not think that she needs neurosurgical intervention at this time.   In addition to her concerns mentioned today, she is persistently hypertensive in the office despite multiple blood pressure rechecks.  She is not having any chest pain, nausea or vomiting or changes in her vision.  She was instructed to call her primary care provider as soon as she leaves our office to discuss best next steps.  If she develops any concerning signs as listed above she was instructed to go to the ER. We will see her going forward on an as-needed basis.  She expressed understanding was in  agreement with this plan.  Thank you for involving me in the care of this patient.   I spent a total of 45 minutes in both face-to-face and non-face-to-face activities for this visit on the date of this encounter.   [facility]

## 2021-09-19 NOTE — Telephone Encounter (Signed)
Please review.  KP

## 2021-10-09 ENCOUNTER — Other Ambulatory Visit: Payer: Self-pay | Admitting: Physical Medicine & Rehabilitation

## 2021-10-09 DIAGNOSIS — M9931 Osseous stenosis of neural canal of cervical region: Secondary | ICD-10-CM

## 2021-10-13 NOTE — Discharge Instructions (Signed)

## 2021-10-16 ENCOUNTER — Ambulatory Visit
Admission: RE | Admit: 2021-10-16 | Discharge: 2021-10-16 | Disposition: A | Discharge status: Home / Self Care | Payer: BC Managed Care – PPO | Source: Ambulatory Visit | Attending: Physical Medicine & Rehabilitation | Admitting: Physical Medicine & Rehabilitation

## 2021-10-16 DIAGNOSIS — M9931 Osseous stenosis of neural canal of cervical region: Secondary | ICD-10-CM

## 2021-10-16 MED ORDER — TRIAMCINOLONE ACETONIDE 40 MG/ML IJ SUSP (RADIOLOGY)
60.0000 mg | Freq: Once | INTRAMUSCULAR | Status: AC
  Administered 2021-10-16: 60 mg via EPIDURAL

## 2021-10-16 MED ORDER — IOPAMIDOL (ISOVUE-M 300) INJECTION 61%
1.0000 mL | Freq: Once | INTRAMUSCULAR | Status: AC
  Administered 2021-10-16: 1 mL via EPIDURAL

## 2021-11-27 ENCOUNTER — Other Ambulatory Visit: Payer: Self-pay | Admitting: Family Medicine

## 2021-11-28 ENCOUNTER — Other Ambulatory Visit: Payer: Self-pay | Admitting: Family Medicine

## 2021-11-28 DIAGNOSIS — M5412 Radiculopathy, cervical region: Secondary | ICD-10-CM

## 2021-12-05 ENCOUNTER — Ambulatory Visit
Admission: RE | Admit: 2021-12-05 | Discharge: 2021-12-05 | Disposition: A | Discharge status: Home / Self Care | Payer: BC Managed Care – PPO | Source: Ambulatory Visit | Attending: Family Medicine | Admitting: Family Medicine

## 2021-12-05 DIAGNOSIS — M5412 Radiculopathy, cervical region: Secondary | ICD-10-CM

## 2021-12-05 MED ORDER — TRIAMCINOLONE ACETONIDE 40 MG/ML IJ SUSP (RADIOLOGY)
60.0000 mg | Freq: Once | INTRAMUSCULAR | Status: AC
  Administered 2021-12-05: 60 mg via EPIDURAL

## 2021-12-05 MED ORDER — IOPAMIDOL (ISOVUE-M 300) INJECTION 61%
1.0000 mL | Freq: Once | INTRAMUSCULAR | Status: AC
  Administered 2021-12-05: 1 mL via EPIDURAL

## 2021-12-05 NOTE — Discharge Instructions (Signed)

## 2021-12-08 ENCOUNTER — Ambulatory Visit: Payer: BC Managed Care – PPO | Admitting: Internal Medicine

## 2022-01-16 ENCOUNTER — Encounter: Payer: Self-pay | Admitting: Internal Medicine

## 2022-01-17 ENCOUNTER — Other Ambulatory Visit: Payer: Self-pay | Admitting: Internal Medicine

## 2022-01-17 ENCOUNTER — Ambulatory Visit (INDEPENDENT_AMBULATORY_CARE_PROVIDER_SITE_OTHER): Payer: BC Managed Care – PPO | Admitting: Internal Medicine

## 2022-01-17 ENCOUNTER — Encounter: Payer: Self-pay | Admitting: Internal Medicine

## 2022-01-17 VITALS — BP 124/72 | HR 103 | Temp 98.2°F | Ht 65.0 in | Wt 210.4 lb

## 2022-01-17 DIAGNOSIS — R509 Fever, unspecified: Secondary | ICD-10-CM | POA: Diagnosis not present

## 2022-01-17 DIAGNOSIS — F324 Major depressive disorder, single episode, in partial remission: Secondary | ICD-10-CM | POA: Diagnosis not present

## 2022-01-17 DIAGNOSIS — J101 Influenza due to other identified influenza virus with other respiratory manifestations: Secondary | ICD-10-CM

## 2022-01-17 DIAGNOSIS — R059 Cough, unspecified: Secondary | ICD-10-CM | POA: Diagnosis not present

## 2022-01-17 DIAGNOSIS — Z1231 Encounter for screening mammogram for malignant neoplasm of breast: Secondary | ICD-10-CM

## 2022-01-17 LAB — POC COVID19 BINAXNOW: SARS Coronavirus 2 Ag: NEGATIVE

## 2022-01-17 LAB — POCT INFLUENZA A/B
Influenza A, POC: POSITIVE — AB
Influenza B, POC: NEGATIVE

## 2022-01-17 MED ORDER — PROMETHAZINE-DM 6.25-15 MG/5ML PO SYRP: 5.0000 mL | ORAL_SOLUTION | Freq: Four times a day (QID) | ORAL | 0 refills | Status: DC | PRN

## 2022-01-17 MED ORDER — PROMETHAZINE-DM 6.25-15 MG/5ML PO SYRP: 5.0000 mL | ORAL_SOLUTION | Freq: Four times a day (QID) | ORAL | 0 refills | Status: AC | PRN

## 2022-01-17 MED ORDER — OSELTAMIVIR PHOSPHATE 75 MG PO CAPS: 75.0000 mg | ORAL_CAPSULE | Freq: Two times a day (BID) | ORAL | 0 refills | Status: AC

## 2022-01-17 MED ORDER — OSELTAMIVIR PHOSPHATE 75 MG PO CAPS: 75.0000 mg | ORAL_CAPSULE | Freq: Two times a day (BID) | ORAL | 0 refills | Status: DC

## 2022-01-17 NOTE — Progress Notes (Signed)
Date:  01/17/2022   Name:  Jade Harris   DOB:  05-01-1965   MRN:  [id number]   Chief Complaint: Cough  Influenza This is a new problem. Episode onset: onset < 48 hrs. The problem has been gradually worsening. Associated symptoms include chills, coughing, fatigue, a fever, headaches and myalgias. Pertinent negatives include no chest pain, sore throat or vomiting. She has tried sleep, drinking and acetaminophen for the symptoms. The treatment provided mild relief.    Lab Results  Component Value Date   NA 142 12/05/2020   K 4.7 12/05/2020   CO2 22 12/05/2020   GLUCOSE 95 12/05/2020   BUN 14 12/05/2020   CREATININE 0.81 12/05/2020   CALCIUM 10.4 (H) 12/05/2020   EGFR 85 12/05/2020   GFRNONAA 99 01/25/2020   Lab Results  Component Value Date   CHOL 276 (H) 12/05/2020   HDL 85 12/05/2020   LDLCALC 166 (H) 12/05/2020   TRIG 141 12/05/2020   CHOLHDL 3.2 12/05/2020   Lab Results  Component Value Date   TSH 1.070 12/05/2020   Lab Results  Component Value Date   HGBA1C 5.2 12/05/2020   Lab Results  Component Value Date   WBC 7.1 12/05/2020   HGB 14.3 12/05/2020   HCT 43.6 12/05/2020   MCV 89 12/05/2020   PLT 291 12/05/2020   Lab Results  Component Value Date   ALT 10 12/05/2020   AST 14 12/05/2020   ALKPHOS 79 12/05/2020   BILITOT 0.3 12/05/2020   Lab Results  Component Value Date   25OHVITD2 <1.0 01/25/2020   25OHVITD3 29 01/25/2020   VD25OH 29.0 (L) 12/05/2020     Review of Systems  Constitutional:  Positive for chills, fatigue and fever.  HENT:  Negative for sore throat and trouble swallowing.   Respiratory:  Positive for cough. Negative for shortness of breath and wheezing.   Cardiovascular:  Negative for chest pain and leg swelling.  Gastrointestinal:  Negative for diarrhea and vomiting.  Musculoskeletal:  Positive for myalgias.  Neurological:  Positive for headaches. Negative for dizziness.    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) 02/03/2020   History of marijuana use 02/03/2020   Spondylosis without myelopathy or radiculopathy, lumbosacral region 02/03/2020   Chronic low back pain (1ry area of Pain) (Bilateral) (R>L) w/o sciatica 01/25/2020   Lumbosacral facet syndrome (Bilateral) (R>L) 01/25/2020   Chronic musculoskeletal pain 01/25/2020   Chronic lower extremity pain (2ry area of Pain) (Right) 01/25/2020   Chronic groin pain (Intermittent) (Right) 01/25/2020   Lumbosacral radiculopathy/radiculitis at L2 (Right) 01/25/2020   Chronic neck pain (Bilateral) (L>R) 01/25/2020   Cervicalgia 01/25/2020   Cervicogenic headache (Left) 01/25/2020   Cervico-occipital neuralgia (Left) 01/25/2020   Radiculitis involving upper extremity (Left) 01/25/2020   Numbness and tingling of upper extremity (Left) 01/25/2020   DDD (degenerative disc disease), cervical 01/25/2020   DDD (degenerative disc disease), lumbosacral 01/25/2020   Discogenic low back pain 01/25/2020   Chronic pain syndrome 01/24/2020   Disorder of skeletal system 01/24/2020   Slow transit constipation 11/25/2018   Venous insufficiency  of both lower extremities 11/25/2018   Eczema 08/23/2017   Headache, chronic daily 09/20/2015   Dyslipidemia 07/04/2014   Depression, major, single episode, in partial remission ([place]) 07/04/2014   Menopause 07/04/2014   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   Idiopathic insomnia 07/04/2014    Allergies  Allergen Reactions   Sulfa Antibiotics Other  (See Comments)    Past Surgical History:  Procedure Laterality Date   APPENDECTOMY     MOUTH SURGERY     NASAL SINUS SURGERY  04/2014   TONSILLECTOMY     TOTAL ABDOMINAL HYSTERECTOMY W/ BILATERAL SALPINGOOPHORECTOMY     wtih oophorectomy    Social History   Tobacco Use   Smoking status: Former    Types: Cigarettes    Quit date: 1989    Years since quitting: 34.9   Smokeless tobacco: Never  Vaping Use   Vaping Use: Never used  Substance Use Topics   Alcohol use: Yes    Alcohol/week: 0.0 standard drinks of alcohol    Comment: occasional   Drug use: No     Medication list has been reviewed and updated.  Current Meds  Medication Sig   acetaminophen (TYLENOL) 500 MG tablet Take 500 mg by mouth every 6 (six) hours as needed.   albuterol (VENTOLIN HFA) 108 (90 Base) MCG/ACT inhaler Inhale 2 puffs into the lungs every 6 (six) hours as needed for wheezing or shortness of breath.   B Complex Vitamins (B COMPLEX PO) Take 1 tablet by mouth daily.   Calcipotriene-Betameth Diprop 0.005-0.064 % FOAM Apply topically as needed. Eczema   diphenhydrAMINE (BENADRYL) 25 MG tablet Take 25 mg by mouth every 6 (six) hours as needed.   DULoxetine (CYMBALTA) 30 MG capsule TAKE 3 CAPSULES(90 MG) BY MOUTH DAILY   EPINEPHrine (EPIPEN 2-PAK) 0.3 mg/0.3 mL IJ SOAJ injection Inject 0.3 mg into the muscle as needed for anaphylaxis.   fluticasone (FLONASE) 50 MCG/ACT nasal spray SHAKE LIQUID AND USE 2 SPRAYS IN EACH NOSTRIL DAILY   fluticasone-salmeterol (ADVAIR) 100-50 MCG/ACT AEPB Inhale 1 puff into the lungs 2 (two) times daily.   gabapentin (NEURONTIN) 300 MG capsule Take 1 capsule (300 mg total) by mouth 3 (three) times daily.   ibuprofen (ADVIL) 400 MG tablet Take 400 mg by mouth every 6 (six) hours as needed.   Naproxen Sodium (ALEVE PO) Take by mouth daily.   oseltamivir (TAMIFLU) 75 MG capsule Take 1 capsule (75 mg total) by mouth 2 (two) times daily for 5 days.   promethazine-dextromethorphan  (PROMETHAZINE-DM) 6.25-15 MG/5ML syrup Take 5 mLs by mouth 4 (four) times daily as needed for up to 9 days for cough.   triamterene-hydrochlorothiazide (MAXZIDE-25) 37.5-25 MG tablet Take 1.5 tablets by mouth as needed.       01/17/2022    9:23 AM 06/20/2021    2:31 PM 06/08/2021    9:33 AM 12/26/2020   10:49 AM  GAD 7 : Generalized Anxiety Score  Nervous, Anxious, on Edge 0 0 1 0  Control/stop worrying 0 0 1 0  Worry too much - different things 0 0 1 0  Trouble relaxing 0 0 1 0  Restless 0 0 1 0  Easily annoyed or irritable 0 0 1 0  Afraid - awful might happen 0 0 1 0  Total GAD 7 Score 0 0 7 0  Anxiety Difficulty Not difficult at all Not difficult at all Somewhat difficult Not difficult at all       01/17/2022  9:23 AM 06/20/2021    2:31 PM 06/08/2021    9:33 AM  Depression screen PHQ 2/9  Decreased Interest 0 0 2  Down, Depressed, Hopeless 0 0 2  PHQ - 2 Score 0 0 4  Altered sleeping 0 0 2  Tired, decreased energy 0 3 2  Change in appetite 0 3 2  Feeling bad or failure about yourself  0 0 2  Trouble concentrating 0 0 2  Moving slowly or fidgety/restless 0 0 2  Suicidal thoughts 0 0 0  PHQ-9 Score 0 6 16  Difficult doing work/chores Not difficult at all  Very difficult    BP Readings from Last 3 Encounters:  01/17/22 124/72  12/05/21 (!) 171/98  10/16/21 (!) 174/100    Physical Exam Constitutional:      Appearance: She is ill-appearing.  Cardiovascular:     Rate and Rhythm: Normal rate and regular rhythm.     Heart sounds: No murmur heard. Pulmonary:     Effort: Pulmonary effort is normal.     Breath sounds: Normal breath sounds. No wheezing or rhonchi.  Musculoskeletal:     Cervical back: Normal range of motion.     Right lower leg: No edema.     Left lower leg: No edema.  Lymphadenopathy:     Cervical: No cervical adenopathy.  Neurological:     Mental Status: She is alert.     Wt Readings from Last 3 Encounters:  01/17/22 210 lb 6.4 oz (95.4 kg)   09/19/21 210 lb 9.6 oz (95.5 kg)  07/11/21 211 lb (95.7 kg)    BP 124/72   Pulse (!) 103   Temp 98.2 F (36.8 C) (Oral)   Ht _0  (1.651 m)   Wt 210 lb 6.4 oz (95.4 kg)   SpO2 97%   BMI 35.01 kg/m   Assessment and Plan: 1. Influenza A Continue supportive care with fluids, Theraflu Tamiflu Return to work when fever gone for 24 hours - oseltamivir (TAMIFLU) 75 MG capsule; Take 1 capsule (75 mg total) by mouth 2 (two) times daily for 5 days.  Dispense: 10 capsule; Refill: 0  2. Cough with fever - POC COVID-19 BinaxNow - POCT Influenza A/B - promethazine-dextromethorphan (PROMETHAZINE-DM) 6.25-15 MG/5ML syrup; Take 5 mLs by mouth 4 (four) times daily as needed for up to 9 days for cough.  Dispense: 118 mL; Refill: 0  3. Depression, major, single episode, in partial remission ([place]) Doing well - on Cymbalta helpful for mood and pain  4. Encounter for screening mammogram for breast cancer Over due - pt encouraged to schedule. - MM 3D SCREEN BREAST BILATERAL   Partially dictated using Editor, commissioning. Any errors are unintentional.  Halina Maidens, MD [facility]  01/17/2022

## 2022-01-17 NOTE — Patient Instructions (Signed)
Call [facility] to schedule your mammogram at [phone].  

## 2022-01-19 ENCOUNTER — Encounter: Payer: BC Managed Care – PPO | Admitting: Internal Medicine

## 2022-01-19 ENCOUNTER — Encounter: Payer: Self-pay | Admitting: Internal Medicine

## 2022-01-29 ENCOUNTER — Encounter: Payer: Self-pay | Admitting: Internal Medicine

## 2022-01-30 ENCOUNTER — Ambulatory Visit: Payer: BC Managed Care – PPO | Admitting: Internal Medicine

## 2022-01-30 ENCOUNTER — Encounter: Payer: Self-pay | Admitting: Internal Medicine

## 2022-01-30 VITALS — BP 138/100 | HR 97 | Ht 65.0 in

## 2022-01-30 DIAGNOSIS — R399 Unspecified symptoms and signs involving the genitourinary system: Secondary | ICD-10-CM | POA: Diagnosis not present

## 2022-01-30 DIAGNOSIS — R030 Elevated blood-pressure reading, without diagnosis of hypertension: Secondary | ICD-10-CM | POA: Diagnosis not present

## 2022-01-30 LAB — POC URINALYSIS WITH MICROSCOPIC (NON AUTO)MANUAL RESULT
Crystals: 0
Mucus, UA: 0
RBC: 0 M/uL — AB (ref 4.04–5.48)
WBC Casts, UA: 5

## 2022-01-30 MED ORDER — NITROFURANTOIN MONOHYD MACRO 100 MG PO CAPS: 100.0000 mg | ORAL_CAPSULE | Freq: Two times a day (BID) | ORAL | 0 refills | Status: AC

## 2022-01-30 NOTE — Progress Notes (Signed)
Date:  01/30/2022   Name:  Jade Harris   DOB:  1965-02-04   MRN:  [id number]   Chief Complaint: Urinary Tract Infection  HPI  Lab Results  Component Value Date   NA 142 12/05/2020   K 4.7 12/05/2020   CO2 22 12/05/2020   GLUCOSE 95 12/05/2020   BUN 14 12/05/2020   CREATININE 0.81 12/05/2020   CALCIUM 10.4 (H) 12/05/2020   EGFR 85 12/05/2020   GFRNONAA 99 01/25/2020   Lab Results  Component Value Date   CHOL 276 (H) 12/05/2020   HDL 85 12/05/2020   LDLCALC 166 (H) 12/05/2020   TRIG 141 12/05/2020   CHOLHDL 3.2 12/05/2020   Lab Results  Component Value Date   TSH 1.070 12/05/2020   Lab Results  Component Value Date   HGBA1C 5.2 12/05/2020   Lab Results  Component Value Date   WBC 7.1 12/05/2020   HGB 14.3 12/05/2020   HCT 43.6 12/05/2020   MCV 89 12/05/2020   PLT 291 12/05/2020   Lab Results  Component Value Date   ALT 10 12/05/2020   AST 14 12/05/2020   ALKPHOS 79 12/05/2020   BILITOT 0.3 12/05/2020   Lab Results  Component Value Date   25OHVITD2 <1.0 01/25/2020   25OHVITD3 29 01/25/2020   VD25OH 29.0 (L) 12/05/2020     Review of Systems  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) 02/03/2020   History of marijuana use 02/03/2020   Spondylosis without myelopathy or radiculopathy, lumbosacral region 02/03/2020   Chronic low back pain (1ry area of Pain) (Bilateral) (R>L) w/o sciatica 01/25/2020   Lumbosacral facet syndrome (Bilateral) (R>L) 01/25/2020   Chronic musculoskeletal  pain 01/25/2020   Chronic lower extremity pain (2ry area of Pain) (Right) 01/25/2020   Chronic groin pain (Intermittent) (Right) 01/25/2020   Lumbosacral radiculopathy/radiculitis at L2 (Right) 01/25/2020   Chronic neck pain (Bilateral) (L>R) 01/25/2020   Cervicalgia 01/25/2020   Cervicogenic headache (Left) 01/25/2020   Cervico-occipital neuralgia (Left) 01/25/2020   Radiculitis involving upper extremity (Left) 01/25/2020   Numbness and tingling of upper extremity (Left) 01/25/2020   DDD (degenerative disc disease), cervical 01/25/2020   DDD (degenerative disc disease), lumbosacral 01/25/2020   Discogenic low back pain 01/25/2020   Chronic pain syndrome 01/24/2020   Disorder of skeletal system 01/24/2020   Slow transit constipation 11/25/2018   Venous insufficiency of both lower extremities 11/25/2018   Eczema 08/23/2017   Headache, chronic daily 09/20/2015   Dyslipidemia 07/04/2014   Depression, major, single episode, in partial remission ([place]) 07/04/2014   Menopause 07/04/2014   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   Idiopathic insomnia 07/04/2014    Allergies  Allergen Reactions   Sulfa Antibiotics Other (See Comments)    Past Surgical History:  Procedure Laterality Date   APPENDECTOMY     MOUTH SURGERY     NASAL SINUS SURGERY  04/2014   TONSILLECTOMY     TOTAL ABDOMINAL HYSTERECTOMY W/ BILATERAL SALPINGOOPHORECTOMY     wtih oophorectomy    Social History   Tobacco Use   Smoking status: Former  Types: Cigarettes    Quit date: 36    Years since quitting: 35.0   Smokeless tobacco: Never  Vaping Use   Vaping Use: Never used  Substance Use Topics   Alcohol use: Yes    Alcohol/week: 0.0 standard drinks of alcohol    Comment: occasional   Drug use: No     Medication list has been reviewed and updated.  No outpatient medications have been marked as taking for the 01/30/22 encounter (Office Visit) with Glean Hess, MD.       01/30/2022     4:17 PM 01/17/2022    9:23 AM 06/20/2021    2:31 PM 06/08/2021    9:33 AM  GAD 7 : Generalized Anxiety Score  Nervous, Anxious, on Edge 0 0 0 1  Control/stop worrying 0 0 0 1  Worry too much - different things 0 0 0 1  Trouble relaxing 0 0 0 1  Restless 0 0 0 1  Easily annoyed or irritable 0 0 0 1  Afraid - awful might happen 0 0 0 1  Total GAD 7 Score 0 0 0 7  Anxiety Difficulty Not difficult at all Not difficult at all Not difficult at all Somewhat difficult       01/30/2022    4:17 PM 01/17/2022    9:23 AM 06/20/2021    2:31 PM  Depression screen PHQ 2/9  Decreased Interest 0 0 0  Down, Depressed, Hopeless 0 0 0  PHQ - 2 Score 0 0 0  Altered sleeping 0 0 0  Tired, decreased energy 0 0 3  Change in appetite 0 0 3  Feeling bad or failure about yourself  0 0 0  Trouble concentrating 0 0 0  Moving slowly or fidgety/restless 0 0 0  Suicidal thoughts 0 0 0  PHQ-9 Score 0 0 6  Difficult doing work/chores Not difficult at all Not difficult at all     BP Readings from Last 3 Encounters:  01/17/22 124/72  12/05/21 (!) 171/98  10/16/21 (!) 174/100    Physical Exam  Wt Readings from Last 3 Encounters:  01/17/22 210 lb 6.4 oz (95.4 kg)  09/19/21 210 lb 9.6 oz (95.5 kg)  07/11/21 211 lb (95.7 kg)    Ht _0  (1.651 m)   BMI 35.01 kg/m   Assessment and Plan:

## 2022-01-30 NOTE — Progress Notes (Signed)
Date:  01/30/2022   Name:  Jade Harris   DOB:  [birth year]   MRN:  [id number]   Chief Complaint: Urinary Tract Infection  Urinary Tract Infection  This is a new problem. Episode onset: X 3days. The problem has been gradually improving. The quality of the pain is described as burning (pressure). The pain is at a severity of 1/10. The pain is mild. There has been no fever. She is Not sexually active. There is No history of pyelonephritis. Associated symptoms include flank pain and urgency. Pertinent negatives include no chills, hematuria, nausea or vomiting. Treatments tried: azo, cranberry juice. The treatment provided mild relief. Her past medical history is significant for recurrent UTIs.    Lab Results  Component Value Date   NA 142 12/05/2020   K 4.7 12/05/2020   CO2 22 12/05/2020   GLUCOSE 95 12/05/2020   BUN 14 12/05/2020   CREATININE 0.81 12/05/2020   CALCIUM 10.4 (H) 12/05/2020   EGFR 85 12/05/2020   GFRNONAA 99 01/25/2020   Lab Results  Component Value Date   CHOL 276 (H) 12/05/2020   HDL 85 12/05/2020   LDLCALC 166 (H) 12/05/2020   TRIG 141 12/05/2020   CHOLHDL 3.2 12/05/2020   Lab Results  Component Value Date   TSH 1.070 12/05/2020   Lab Results  Component Value Date   HGBA1C 5.2 12/05/2020   Lab Results  Component Value Date   WBC 7.1 12/05/2020   HGB 14.3 12/05/2020   HCT 43.6 12/05/2020   MCV 89 12/05/2020   PLT 291 12/05/2020   Lab Results  Component Value Date   ALT 10 12/05/2020   AST 14 12/05/2020   ALKPHOS 79 12/05/2020   BILITOT 0.3 12/05/2020   Lab Results  Component Value Date   25OHVITD2 <1.0 01/25/2020   25OHVITD3 29 01/25/2020   VD25OH 29.0 (L) 12/05/2020     Review of Systems  Constitutional:  Negative for chills, fatigue and fever.  Respiratory:  Negative for chest tightness and shortness of breath.   Gastrointestinal:  Negative for diarrhea, nausea and vomiting.  Genitourinary:  Positive for flank pain and urgency.  Negative for hematuria.  Psychiatric/Behavioral:  Negative for dysphoric mood and sleep disturbance.     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) 02/03/2020   History of marijuana use 02/03/2020   Spondylosis without myelopathy or radiculopathy, lumbosacral region 02/03/2020   Chronic low back pain (1ry area of Pain) (Bilateral) (R>L) w/o sciatica 01/25/2020   Lumbosacral facet syndrome (Bilateral) (R>L) 01/25/2020   Chronic musculoskeletal pain 01/25/2020   Chronic lower extremity pain (2ry area of Pain) (Right) 01/25/2020   Chronic groin pain (Intermittent) (Right) 01/25/2020   Lumbosacral radiculopathy/radiculitis at L2 (Right) 01/25/2020   Chronic neck pain (Bilateral) (L>R) 01/25/2020   Cervicalgia 01/25/2020   Cervicogenic headache (Left) 01/25/2020   Cervico-occipital neuralgia (Left) 01/25/2020   Radiculitis involving upper extremity (Left) 01/25/2020   Numbness and tingling of upper extremity (Left) 01/25/2020   DDD (degenerative disc disease), cervical 01/25/2020   DDD (degenerative disc disease), lumbosacral 01/25/2020   Discogenic low  back pain 01/25/2020   Chronic pain syndrome 01/24/2020   Disorder of skeletal system 01/24/2020   Slow transit constipation 11/25/2018   Venous insufficiency of both lower extremities 11/25/2018   Eczema 08/23/2017   Headache, chronic daily 09/20/2015   Dyslipidemia 07/04/2014   Depression, major, single episode, in partial remission ([place]) 07/04/2014   Menopause  07/04/2014   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   Idiopathic insomnia 07/04/2014    Allergies  Allergen Reactions   Sulfa Antibiotics Other (See Comments)    Past Surgical History:  Procedure Laterality Date   APPENDECTOMY     MOUTH SURGERY     NASAL SINUS SURGERY  04/2014   TONSILLECTOMY     TOTAL ABDOMINAL HYSTERECTOMY W/ BILATERAL SALPINGOOPHORECTOMY     wtih oophorectomy    Social History   Tobacco Use   Smoking status: Former    Types: Cigarettes    Quit date: 1989    Years since quitting: 35.0   Smokeless tobacco: Never  Vaping Use   Vaping Use: Never used  Substance Use Topics   Alcohol use: Yes    Alcohol/week: 0.0 standard drinks of alcohol    Comment: occasional   Drug use: No     Medication list has been reviewed and updated.  Current Meds  Medication Sig   nitrofurantoin, macrocrystal-monohydrate, (MACROBID) 100 MG capsule Take 1 capsule (100 mg total) by mouth 2 (two) times daily for 7 days.       01/30/2022    4:17 PM 01/17/2022    9:23 AM 06/20/2021    2:31 PM 06/08/2021    9:33 AM  GAD 7 : Generalized Anxiety Score  Nervous, Anxious, on Edge 0 0 0 1  Control/stop worrying 0 0 0 1  Worry too much - different things 0 0 0 1  Trouble relaxing 0 0 0 1  Restless 0 0 0 1  Easily annoyed or irritable 0 0 0 1  Afraid - awful might happen 0 0 0 1  Total GAD 7 Score 0 0 0 7  Anxiety Difficulty Not difficult at all Not difficult at all Not difficult at all Somewhat difficult       01/30/2022    4:17 PM 01/17/2022    9:23 AM 06/20/2021    2:31 PM  Depression screen PHQ 2/9  Decreased Interest 0 0 0  Down, Depressed, Hopeless 0 0 0  PHQ - 2 Score 0 0 0  Altered sleeping 0 0 0  Tired, decreased energy 0 0 3  Change in appetite 0 0 3  Feeling bad or failure about yourself  0 0 0  Trouble concentrating 0 0 0  Moving slowly or fidgety/restless 0 0 0  Suicidal thoughts 0 0 0  PHQ-9 Score 0 0 6  Difficult doing work/chores Not  difficult at all Not difficult at all     BP Readings from Last 3 Encounters:  01/30/22 (!) 138/100  01/17/22 124/72  12/05/21 (!) 171/98    Physical Exam Vitals and nursing note reviewed.  Constitutional:      Appearance: She is well-developed.  Cardiovascular:     Rate and Rhythm: Normal rate and regular rhythm.     Heart sounds: Normal heart sounds.  Pulmonary:     Effort: Pulmonary effort is normal. No respiratory distress.     Breath sounds: Normal breath sounds.  Abdominal:     General: Bowel sounds are normal.     Palpations: Abdomen is soft.  Tenderness: There is abdominal tenderness in the suprapubic area. There is no right CVA tenderness, left CVA tenderness, guarding or rebound.     Wt Readings from Last 3 Encounters:  01/17/22 210 lb 6.4 oz (95.4 kg)  09/19/21 210 lb 9.6 oz (95.5 kg)  07/11/21 211 lb (95.7 kg)    BP (!) 138/100   Pulse 97   Ht _0  (1.651 m)   SpO2 95%   BMI 35.01 kg/m   Assessment and Plan: 1. UTI symptoms Continue to push fluids Take AZO for one more day if desired - POC urinalysis w microscopic (non auto) - nitrofurantoin, macrocrystal-monohydrate, (MACROBID) 100 MG capsule; Take 1 capsule (100 mg total) by mouth 2 (two) times daily for 7 days.  Dispense: 14 capsule; Refill: 0  2. Elevated BP without diagnosis of hypertension Recommend checking weekly at home Return if persistently elevated   Partially dictated using Editor, commissioning. Any errors are unintentional.  Halina Maidens, MD [facility]  01/30/2022

## 2022-02-16 ENCOUNTER — Other Ambulatory Visit: Payer: Self-pay | Admitting: Internal Medicine

## 2022-02-16 DIAGNOSIS — M5030 Other cervical disc degeneration, unspecified cervical region: Secondary | ICD-10-CM

## 2022-04-08 ENCOUNTER — Encounter: Payer: Self-pay | Admitting: Internal Medicine

## 2022-04-08 ENCOUNTER — Emergency Department: Payer: BC Managed Care – PPO

## 2022-04-08 ENCOUNTER — Observation Stay
Admission: EM | Admit: 2022-04-08 | Discharge: 2022-04-09 | Disposition: A | Discharge status: Home / Self Care | Payer: BC Managed Care – PPO | Attending: Internal Medicine | Admitting: Internal Medicine

## 2022-04-08 DIAGNOSIS — R112 Nausea with vomiting, unspecified: Secondary | ICD-10-CM

## 2022-04-08 DIAGNOSIS — R4182 Altered mental status, unspecified: Secondary | ICD-10-CM | POA: Diagnosis not present

## 2022-04-08 DIAGNOSIS — G9341 Metabolic encephalopathy: Secondary | ICD-10-CM

## 2022-04-08 DIAGNOSIS — R519 Headache, unspecified: Secondary | ICD-10-CM | POA: Diagnosis present

## 2022-04-08 DIAGNOSIS — Z85828 Personal history of other malignant neoplasm of skin: Secondary | ICD-10-CM | POA: Diagnosis not present

## 2022-04-08 DIAGNOSIS — G43909 Migraine, unspecified, not intractable, without status migrainosus: Secondary | ICD-10-CM

## 2022-04-08 DIAGNOSIS — R29818 Other symptoms and signs involving the nervous system: Secondary | ICD-10-CM | POA: Diagnosis not present

## 2022-04-08 DIAGNOSIS — M5412 Radiculopathy, cervical region: Secondary | ICD-10-CM | POA: Diagnosis present

## 2022-04-08 DIAGNOSIS — I10 Essential (primary) hypertension: Secondary | ICD-10-CM | POA: Diagnosis not present

## 2022-04-08 DIAGNOSIS — E785 Hyperlipidemia, unspecified: Secondary | ICD-10-CM | POA: Diagnosis not present

## 2022-04-08 DIAGNOSIS — R42 Dizziness and giddiness: Secondary | ICD-10-CM

## 2022-04-08 DIAGNOSIS — Z87891 Personal history of nicotine dependence: Secondary | ICD-10-CM | POA: Diagnosis not present

## 2022-04-08 DIAGNOSIS — Z79899 Other long term (current) drug therapy: Secondary | ICD-10-CM | POA: Diagnosis not present

## 2022-04-08 HISTORY — DX: Metabolic encephalopathy: G93.41

## 2022-04-08 HISTORY — DX: Other symptoms and signs involving the nervous system: R29.818

## 2022-04-08 LAB — CBC
HCT: 38.3 % (ref 36.0–46.0)
Hemoglobin: 12.2 g/dL (ref 12.0–15.0)
MCH: 28.6 pg (ref 26.0–34.0)
MCHC: 31.9 g/dL (ref 30.0–36.0)
MCV: 89.9 fL (ref 80.0–100.0)
Platelets: 263 10*3/uL (ref 150–400)
RBC: 4.26 MIL/uL (ref 3.87–5.11)
RDW: 13.1 % (ref 11.5–15.5)
WBC: 8.8 10*3/uL (ref 4.0–10.5)
nRBC: 0 % (ref 0.0–0.2)

## 2022-04-08 LAB — COMPREHENSIVE METABOLIC PANEL
ALT: 17 U/L (ref 0–44)
AST: 20 U/L (ref 15–41)
Albumin: 4.1 g/dL (ref 3.5–5.0)
Alkaline Phosphatase: 63 U/L (ref 38–126)
Anion gap: 9 (ref 5–15)
BUN: 14 mg/dL (ref 6–20)
CO2: 23 mmol/L (ref 22–32)
Calcium: 9.1 mg/dL (ref 8.9–10.3)
Chloride: 104 mmol/L (ref 98–111)
Creatinine, Ser: 0.8 mg/dL (ref 0.44–1.00)
GFR, Estimated: >60 mL/min (ref >60)
Glucose, Bld: 124 mg/dL — ABNORMAL HIGH (ref 70–99)
Potassium: 3.7 mmol/L (ref 3.5–5.1)
Sodium: 136 mmol/L (ref 135–145)
Total Bilirubin: 0.6 mg/dL (ref 0.3–1.2)
Total Protein: 7.4 g/dL (ref 6.5–8.1)

## 2022-04-08 LAB — TROPONIN I (HIGH SENSITIVITY)
Troponin I (High Sensitivity): 4 ng/L (ref <18)
Troponin I (High Sensitivity): 3 ng/L (ref <18)

## 2022-04-08 LAB — DIFFERENTIAL
Abs Immature Granulocytes: 0.02 10*3/uL (ref 0.00–0.07)
Basophils Absolute: 0.1 10*3/uL (ref 0.0–0.1)
Basophils Relative: 1 %
Eosinophils Absolute: 0 10*3/uL (ref 0.0–0.5)
Eosinophils Relative: 1 %
Immature Granulocytes: 0 %
Lymphocytes Relative: 24 %
Lymphs Abs: 2.1 10*3/uL (ref 0.7–4.0)
Monocytes Absolute: 0.6 10*3/uL (ref 0.1–1.0)
Monocytes Relative: 7 %
Neutro Abs: 6 10*3/uL (ref 1.7–7.7)
Neutrophils Relative %: 67 %

## 2022-04-08 LAB — CBG MONITORING, ED: Glucose-Capillary: 120 mg/dL — ABNORMAL HIGH (ref 70–99)

## 2022-04-08 LAB — ETHANOL: Alcohol, Ethyl (B): <10 mg/dL (ref <10)

## 2022-04-08 LAB — APTT: aPTT: 24 seconds (ref 24–36)

## 2022-04-08 LAB — PROTIME-INR
INR: 1 (ref 0.8–1.2)
Prothrombin Time: 12.8 seconds (ref 11.4–15.2)

## 2022-04-08 MED ORDER — SODIUM CHLORIDE 0.9 % IV SOLN: INTRAVENOUS | Status: AC

## 2022-04-08 MED ORDER — ONDANSETRON HCL 4 MG/2ML IJ SOLN
INTRAMUSCULAR | Status: AC
  Administered 2022-04-08: 4 mg via INTRAVENOUS
  Filled 2022-04-08: qty 2

## 2022-04-08 MED ORDER — STROKE: EARLY STAGES OF RECOVERY BOOK: Freq: Once | Status: DC

## 2022-04-08 MED ORDER — MIDAZOLAM HCL 2 MG/2ML IJ SOLN: 1.0000 mg | Freq: Once | INTRAMUSCULAR | Status: DC

## 2022-04-08 MED ORDER — ENOXAPARIN SODIUM 60 MG/0.6ML IJ SOSY
0.5000 mg/kg | PREFILLED_SYRINGE | INTRAMUSCULAR | Status: DC
  Administered 2022-04-09: 50 mg via SUBCUTANEOUS
  Filled 2022-04-08: qty 0.6

## 2022-04-08 MED ORDER — SODIUM CHLORIDE 0.9 % IV BOLUS
500.0000 mL | Freq: Once | INTRAVENOUS | Status: AC
  Administered 2022-04-08: 500 mL via INTRAVENOUS

## 2022-04-08 MED ORDER — ACETAMINOPHEN 325 MG PO TABS
650.0000 mg | ORAL_TABLET | ORAL | Status: DC | PRN
  Administered 2022-04-09: 650 mg via ORAL
  Filled 2022-04-08: qty 2

## 2022-04-08 MED ORDER — ASPIRIN 81 MG PO TBEC
81.0000 mg | DELAYED_RELEASE_TABLET | Freq: Every day | ORAL | Status: DC
  Administered 2022-04-09: 81 mg via ORAL
  Filled 2022-04-08: qty 1

## 2022-04-08 MED ORDER — ENOXAPARIN SODIUM 40 MG/0.4ML IJ SOSY: 40.0000 mg | PREFILLED_SYRINGE | INTRAMUSCULAR | Status: DC

## 2022-04-08 MED ORDER — ONDANSETRON HCL 4 MG/2ML IJ SOLN: 4.0000 mg | Freq: Once | INTRAMUSCULAR | Status: AC

## 2022-04-08 MED ORDER — ACETAMINOPHEN 160 MG/5ML PO SOLN: 650.0000 mg | ORAL | Status: DC | PRN

## 2022-04-08 MED ORDER — ACETAMINOPHEN 650 MG RE SUPP: 650.0000 mg | RECTAL | Status: DC | PRN

## 2022-04-08 MED ORDER — IOHEXOL 350 MG/ML SOLN
100.0000 mL | Freq: Once | INTRAVENOUS | Status: AC | PRN
  Administered 2022-04-08: 100 mL via INTRAVENOUS

## 2022-04-08 MED ORDER — ASPIRIN 81 MG PO CHEW
324.0000 mg | CHEWABLE_TABLET | Freq: Once | ORAL | Status: AC
  Administered 2022-04-08: 324 mg via ORAL
  Filled 2022-04-08: qty 4

## 2022-04-08 NOTE — ED Provider Notes (Signed)
[facility] Provider Note    Event Date/Time   First MD Initiated Contact with Patient 04/08/22 2008     (approximate)   History   No chief complaint on file.   HPI  Jade Harris is a 57 y.o. female presents to the emergency department with strokelike symptoms.  Activated code stroke prior to arrival.  Patient's last known well was at 6:00 PM.  Patient was seen by her husband and then when he went outside and came back in 61 she was lying on the ground.  Patient was altered, had slurred speech and he noted a mild facial droop.  Patient was complaining of nausea vomiting.  Denied any dizziness just states that she does not feel well.  States that she feels weak all over.  Unable to ambulate but unable to further state whether she is having any vertigo symptoms.  Denies any similar symptoms in the past.  Denies any falls or trauma.  Denies any chest pain.  States that she has chronic neck pain and she is followed by neurosurgery for that.  States she takes gabapentin intermittently but no other sedating medications.  Not on anticoagulation.     Physical Exam   Triage Vital Signs: ED Triage Vitals  Enc Vitals Group     BP 04/08/22 2038 (!) 153/88     Pulse Rate 04/08/22 2032 90     Resp 04/08/22 2032 18     Temp --      Temp src --      SpO2 04/08/22 2038 94 %     Weight --      Height --      Head Circumference --      Peak Flow --      Pain Score 04/08/22 2032 0     Pain Loc --      Pain Edu? --      Excl. in [place]? --     Most recent vital signs: Vitals:   04/08/22 2032 04/08/22 2038  BP:  (!) 153/88  Pulse: 90 87  Resp: 18 16  SpO2:  94%    Physical Exam Constitutional:      Appearance: She is well-developed.     Comments: Somnolent but arousable  HENT:     Head: Atraumatic.  Eyes:     Conjunctiva/sclera: Conjunctivae normal.     Pupils: Pupils are equal, round, and reactive to light.     Comments: No nystagmus  Cardiovascular:      Rate and Rhythm: Regular rhythm.  Pulmonary:     Effort: No respiratory distress.  Abdominal:     General: There is no distension.  Musculoskeletal:        General: Normal range of motion.     Cervical back: Normal range of motion.  Skin:    General: Skin is warm.  Neurological:     Mental Status: She is alert. Mental status is at baseline.     Cranial Nerves: Dysarthria present.     Sensory: Sensation is intact.     Comments: Activated code     IMPRESSION / MDM / [place] / ED COURSE  I reviewed the triage vital signs and the nursing notes.  Upon arrival activated code stroke.  Concern for possible posterior circulation CVA.  Immediately taken to CT scanner.  Discussed with radiology but CT scan of the head with no signs of intracranial hemorrhage or infarction.  Discussed with stroke neurology, Dr.  Owens Shark, given that the patient did not have any obvious focal symptoms at this time, not stating that she has vertigo, not a clear posterior CVA.  Did not recommend TNK given no focal symptoms at this time.  CTA with no signs of obvious dissection or LVO.  Recommended admission for MRI and daily aspirin if no other contraindications.  EKG  I, Nathaniel Man, the attending physician, personally viewed and interpreted this ECG.   Rate: Normal  Rhythm: Normal sinus  Axis: Normal  Intervals: Normal  ST&T Change: None  No tachycardic or bradycardic dysrhythmias while on cardiac telemetry.  RADIOLOGY I independently reviewed imaging, my interpretation of imaging: CT scan of head no signs of intracranial hemorrhage or infarction.  LABS (all labs ordered are listed, but only abnormal results are displayed) Labs interpreted as -    Labs Reviewed  COMPREHENSIVE METABOLIC PANEL - Abnormal; Notable for the following components:      Result Value   Glucose, Bld 124 (*)    All other components within normal limits  ETHANOL  PROTIME-INR  APTT  CBC  DIFFERENTIAL   URINE DRUG SCREEN, QUALITATIVE ([facility] ONLY)  TROPONIN I (HIGH SENSITIVITY)    TREATMENT  Aspirin, IV antiemetics with Zofran, IV fluids  MDM  Patient activated code stroke.  After discussion with neurology ultimately not a TNK candidate given no focal deficits and not a clear posterior CVA.  CTA with no LVO.  Recommended admission for MRI.  Troponin negative.  Consulted hospitalist for admission.     PROCEDURES:  Critical Care performed: yes  .Critical Care  Performed by: Nathaniel Man, MD Authorized by: Nathaniel Man, MD   Critical care provider statement:    Critical care time (minutes):  45   Critical care time was exclusive of:  Separately billable procedures and treating other patients   Critical care was necessary to treat or prevent imminent or life-threatening deterioration of the following conditions:  CNS failure or compromise   Critical care was time spent personally by me on the following activities:  Development of treatment plan with patient or surrogate, discussions with consultants, evaluation of patient's response to treatment, examination of patient, ordering and review of laboratory studies, ordering and review of radiographic studies, ordering and performing treatments and interventions, pulse oximetry, re-evaluation of patient's condition and review of old charts   Patient's presentation is most consistent with acute presentation with potential threat to life or bodily function.   MEDICATIONS ORDERED IN ED: Medications  midazolam (VERSED) injection 1 mg (0 mg Intravenous Hold 04/08/22 2133)  aspirin chewable tablet 324 mg (has no administration in time range)  sodium chloride 0.9 % bolus 500 mL (has no administration in time range)  ondansetron (ZOFRAN) injection 4 mg (4 mg Intravenous Given 04/08/22 2013)  iohexol (OMNIPAQUE) 350 MG/ML injection 100 mL (100 mLs Intravenous Contrast Given 04/08/22 2105)    FINAL CLINICAL IMPRESSION(S) / ED DIAGNOSES    Final diagnoses:  Altered mental status, unspecified altered mental status type  Nausea and vomiting, unspecified vomiting type  Dizzy     Rx / DC Orders   ED Discharge Orders     None        Note:  This document was prepared using Dragon voice recognition software and may include unintentional dictation errors.   Nathaniel Man, MD 04/08/22 2138

## 2022-04-08 NOTE — Assessment & Plan Note (Addendum)
Patient presents with generalized weakness, somnolence, nausea and vomiting Labs reassuring, EKG nonacute Concern for posterior territory CVA Will get urine analysis EtOH level negative  will get UDS Patient on Benadryl 25, gabapentin 300, not on narcotics so low suspicion for medication related encephalopathy

## 2022-04-08 NOTE — Assessment & Plan Note (Signed)
Follows with physiatry and has received epidural spinal injections in the past

## 2022-04-08 NOTE — Consult Note (Addendum)
Pt 57yo female coming in by EMS. Hx of migraine and followed by neurology. MRS 4 LKW 1820. Reports Rside facial droop, slurred speech, headache, and n/v.  Cart activated at 2011. Pt in CT upon cart activation Page to Neuro sent at 2018. Pt transferred from CT to exam froom 2026.  Teleneuro on call at 2027,  Teleneuro assessing at 2029. Off call at 2052.  Pt to return to CT for Perfusion studies. Will continue to monitor patient scans and wait for official radiology read.  2227 Notified Teleneuro provider of CTA results at this time.

## 2022-04-08 NOTE — Assessment & Plan Note (Signed)
Chronic daily headaches Patient follows with neurology

## 2022-04-08 NOTE — Assessment & Plan Note (Addendum)
Permissive hypertension for first 24-48 hrs post stroke onset: Prn Labetalol IV or Vasotec IV If BP greater than 220/120  Statins for LDL goal less than 70 ASA '81mg'$  daily, Plavix '75mg'$  daily x 3 weeks then monotherapy thereafter Telemetry, echo CTA head and neck negative Follow up MRI Avoid dextrose containing fluids, Maintain euglycemia, euthermia Neuro checks q4 hrs x 24 hrs and then per shift Head of bed 30 degrees Physical therapy/Occupational therapy/Speech therapy if failed dysphagia screen Follow official teleneurology consult and consult neurology consult to follow

## 2022-04-08 NOTE — Consult Note (Signed)
[facility] Tele[facility] Consult Services   Patient Name:   Jade Harris, Jade Harris Date of Birth:   09/30/1965 Identification Number:   MRN - [id number] Date of Service:   04/08/2022 20:18:09  Diagnosis:       M62.81 - Generalized Muscle Weakness  Impression:      PT is a 57 yo F with hx of migraines, degenerative disc disease with chronic neck pain, HTN who presented to the ED with generalized weakness, slurred speech and right facial droop. . Since it was unclear if she had vertigo as she did not consistently endorse dizziness and she had no focal weakness on exam, her symptoms were not necessarily suggestive of stroke and so the risks of iv-thrombolytics were felt to outweigh the potential benefit. A CTA head and neck showed no obvious LVO, but will follow-up with the final report. Recommend MRI brain to rule out stroke, lipid panel, hemoglobin AIC. Can start ASA 81 mg daily if no contraindications.  Our recommendations are outlined below.  Recommendations:        Stroke/Telemetry Floor       Neuro Checks       Bedside Swallow Eval       DVT Prophylaxis       IV Fluids, Normal Saline       Head of Bed 30 Degrees       Euglycemia and Avoid Hyperthermia (PRN Acetaminophen)       Initiate or continue Aspirin 81 MG daily       Antihypertensives PRN if Blood pressure is greater than 220/120 or there is a concern for End organ damage/contraindications for permissive HTN. If blood pressure is greater than 220/120 give labetalol PO or IV or Vasotec IV with a goal of 15% reduction in BP during the first 24 hours.       MRI brain without contrast, lipid panel, hemoglobin AIC  Sign Out:       Discussed with Emergency Department Provider    ------------------------------------------------------------------------------  Advanced Imaging: Advanced imaging has been ordered. Results pending.   Metrics: Last Known Well: 04/08/2022 18:20:00 [facility] Notification  Time: 04/08/2022 [id number] Arrival Time: 04/08/2022 20:06:00 Stamp Time: 04/08/2022 20:18:09 Initial Response Time: 04/08/2022 20:23:45 Symptoms: Generalized weakness, slurred speech . Initial patient interaction: 04/08/2022 20:29:44 NIHSS Assessment Completed: 04/08/2022 20:39:56 Patient is not a candidate for Thrombolytic. Thrombolytic Medical Decision: 04/08/2022 20:54:13 Patient was not deemed candidate for Thrombolytic because of following reasons: Other Diagnosis suspected.  I personally Reviewed the CT Head and it showed no acute ischemic changes, no ICH.  Primary Provider Notified of Diagnostic Impression and Management Plan on: 04/08/2022 20:53:02    ------------------------------------------------------------------------------  History of Present Illness: Patient is a 57 year old Female.  Patient was brought by EMS for symptoms of Generalized weakness, slurred speech . PT is a 57 yo F with hx of migraines, degenerative disc disease wtih chronic neck pain, HTN who presented to the ED with generalized weakness, slurred speech and right facial droop. PT was last known well at 18:20 . He went outside and when he came back, EMS was called because pt was on the ground with generalized weakness, nausea and voting and a right facial drop and slurred speech. Pt continued to have nausea in the ED and was given Zofran. She keeps her eyes closed and is slow to respond and hypophonic but is oriented x 3. She denies any headache but states that she feels nauseated when she opens her eyes. She denies any headache and does  not consistently endorse dizziness or any spinning sensation.    Past Medical History:      Hypertension      Migraine Headaches      There is no history of Diabetes Mellitus      There is no history of Hyperlipidemia      There is no history of Atrial Fibrillation      There is no history of Coronary Artery Disease      There is no history of  Stroke  Medications:  No Anticoagulant use  No Antiplatelet use Reviewed EMR for current medications  Allergies:  Reviewed Description: Sulfa  Social History: Smoking: No  Family History:  There is no family history of premature cerebrovascular disease pertinent to this consultation  ROS : 14 Points Review of Systems was performed and was negative except mentioned in HPI.  Past Surgical History: There Is No Surgical History Contributory To Today's Visit     Examination: BP(153/88), Pulse(87), 1A: Level of Consciousness - Alert; keenly responsive + 0 1B: Ask Month and Age - Both Questions Right + 0 1C: Blink Eyes & Squeeze Hands - Performs Both Tasks + 0 2: Test Horizontal Extraocular Movements - Normal + 0 3: Test Visual Fields - No Visual Loss + 0 4: Test Facial Palsy (Use Grimace if Obtunded) - Normal symmetry + 0 5A: Test Left Arm Motor Drift - No Drift for 10 Seconds + 0 5B: Test Right Arm Motor Drift - No Drift for 10 Seconds + 0 6A: Test Left Leg Motor Drift - No Drift for 5 Seconds + 0 6B: Test Right Leg Motor Drift - No Drift for 5 Seconds + 0 7: Test Limb Ataxia (FNF/Heel-Shin) - No Ataxia + 0 8: Test Sensation - Normal; No sensory loss + 0 9: Test Language/Aphasia - Normal; No aphasia + 0 10: Test Dysarthria - Normal + 0 11: Test Extinction/Inattention - No abnormality + 0  NIHSS Score: 0  NIHSS Free Text : PT kept eyes closed, but answered questions although hypophonic, no facial droop or drift, could only walk with assistance  Pre-Morbid Modified Rankin Scale: 4 Points = Moderately severe disability; unable to walk and attend to bodily needs without assistance  Spoke with : Dr. Nathaniel Man  Patient/Family was informed the Neurology Consult would occur via TeleHealth consult by way of interactive audio and video telecommunications and consented to receiving care in this manner.   Patient is being evaluated for possible acute neurologic impairment  and high probability of imminent or life-threatening deterioration. I spent total of 45 minutes providing care to this patient, including time for face to face visit via telemedicine, review of medical records, imaging studies and discussion of findings with providers, the patient and/or family.   Dr Sherri Rad   [facility] For Inpatient follow-up with [facility] physician please call [facility] [phone]. This is not an outpatient service. Post hospital discharge, please contact hospital directly.  Please do not communicate with [facility] physicians via secure chat. If you have any questions, Please contact [facility]. Please call or reconsult our service if there are any clinical or diagnostic changes.

## 2022-04-08 NOTE — ED Triage Notes (Signed)
Pt to ED from home with slurred speech, right sided facial droop with generalized weakness N/V since 1830. Pt does not have a hx of stroke and is not on blood thinners. Pt is A&O x4. CBG 131

## 2022-04-08 NOTE — H&P (Signed)
History and Physical    Patient: Jade Harris [id number] DOB: October 15, 1965 DOA: 04/08/2022 DOS: the patient was seen and examined on 04/08/2022 PCP: Glean Hess, MD  Patient coming from: Home  Chief Complaint: No chief complaint on file.   HPI: Jade Harris is a 57 y.o. female with medical history significant for hypertension, migraines with chronic daily headaches, chronic low back pain, cervical spinal stenosis with cervical radiculopathy, evaluated by both neurosurgery and physiatry back in September 2023, for which she received epidural spinal injection and referred to neurology at that time, who presents to the ED as a code stroke after patient was found on the floor by her husband with slurred speech a right-sided facial droop and generalized weakness,.  She was awake but somnolent.  He had apparently last seen her at 6 PM when she was in her usual state of health and returned to [facility] the aforementioned state at 6:30 PM.  She also had nausea and was vomiting. CT head Per code stroke recall showed no acute intracranial abnormality She was seen in consultation by neurologist who recommended CTA head and neck for possible posterior territory stroke as well as MRI, formal recommendations pending.  Per ED provider, tPA was not recommended as there were no clear symptoms of vertigo Additional ED workup: Vitals were unremarkable.  Labs unremarkable.  EtOH less than 10.  EKG with NSR of 85 and nonacute Patient was treated with Versed 1 mg and a chewable aspirin 324 and given a 1 L NS bolus as well as Zofran Hospitalist consulted for admission.     Past Medical History:  Diagnosis Date   Cancer ([place])    skin ca   Past Surgical History:  Procedure Laterality Date   APPENDECTOMY     MOUTH SURGERY     NASAL SINUS SURGERY  04/2014   TONSILLECTOMY     TOTAL ABDOMINAL HYSTERECTOMY W/ BILATERAL SALPINGOOPHORECTOMY     wtih oophorectomy   Social History:  reports that she  quit smoking about 35 years ago. Her smoking use included cigarettes. She has never used smokeless tobacco. She reports current alcohol use. She reports that she does not use drugs.  Allergies  Allergen Reactions   Sulfa Antibiotics Other (See Comments)    Family History  Problem Relation Age of Onset   Hypertension Mother    Breast cancer Neg Hx     Prior to Admission medications   Medication Sig Start Date End Date Taking? Authorizing Provider  acetaminophen (TYLENOL) 500 MG tablet Take 500 mg by mouth every 6 (six) hours as needed.    [provider]  albuterol (VENTOLIN HFA) 108 (90 Base) MCG/ACT inhaler Inhale 2 puffs into the lungs every 6 (six) hours as needed for wheezing or shortness of breath. 06/20/21   Glean Hess, MD  B Complex Vitamins (B COMPLEX PO) Take 1 tablet by mouth daily.    [provider]  Calcipotriene-Betameth Diprop 0.005-0.064 % FOAM Apply topically as needed. Eczema    [provider]  diphenhydrAMINE (BENADRYL) 25 MG tablet Take 25 mg by mouth every 6 (six) hours as needed.    [provider]  DULoxetine (CYMBALTA) 30 MG capsule TAKE 3 CAPSULES(90 MG) BY MOUTH DAILY 08/03/21   Glean Hess, MD  EPINEPHrine (EPIPEN 2-PAK) 0.3 mg/0.3 mL IJ SOAJ injection Inject 0.3 mg into the muscle as needed for anaphylaxis. 06/20/21   Glean Hess, MD  fluticasone (FLONASE) 50 MCG/ACT nasal spray SHAKE LIQUID  AND USE 2 SPRAYS IN [facility] NOSTRIL DAILY 07/04/21   Glean Hess, MD  fluticasone-salmeterol (ADVAIR) 100-50 MCG/ACT AEPB Inhale 1 puff into the lungs 2 (two) times daily. 12/05/20   Glean Hess, MD  gabapentin (NEURONTIN) 300 MG capsule TAKE 1 CAPSULE(300 MG) BY MOUTH THREE TIMES DAILY 02/19/22   Glean Hess, MD  ibuprofen (ADVIL) 400 MG tablet Take 400 mg by mouth every 6 (six) hours as needed.    [provider]  Naproxen Sodium (ALEVE PO) Take by mouth daily.    [provider]   triamterene-hydrochlorothiazide (MAXZIDE-25) 37.5-25 MG tablet Take 1.5 tablets by mouth as needed. 08/08/21   Glean Hess, MD    Physical Exam: Vitals:   04/08/22 2032 04/08/22 2038  BP:  (!) 153/88  Pulse: 90 87  Resp: 18 16  SpO2:  94%   Physical Exam Vitals and nursing note reviewed.  Constitutional:      General: She is awake. She is not in acute distress. HENT:     Head: Normocephalic and atraumatic.  Cardiovascular:     Rate and Rhythm: Normal rate and regular rhythm.     Heart sounds: Normal heart sounds.  Pulmonary:     Effort: Pulmonary effort is normal.     Breath sounds: Normal breath sounds.  Abdominal:     Palpations: Abdomen is soft.     Tenderness: There is no abdominal tenderness.  Neurological:     General: No focal deficit present.     Mental Status: She is lethargic.     Labs on Admission: I have personally reviewed following labs and imaging studies  CBC: Recent Labs  Lab 04/08/22 2010  WBC 8.8  NEUTROABS 6.0  HGB 12.2  HCT 38.3  MCV 89.9  PLT 99991111   Basic Metabolic Panel: Recent Labs  Lab 04/08/22 2010  NA 136  K 3.7  CL 104  CO2 23  GLUCOSE 124*  BUN 14  CREATININE 0.80  CALCIUM 9.1   GFR: CrCl cannot be calculated (Unknown ideal weight.). Liver Function Tests: Recent Labs  Lab 04/08/22 2010  AST 20  ALT 17  ALKPHOS 63  BILITOT 0.6  PROT 7.4  ALBUMIN 4.1   No results for input(s): "LIPASE", "AMYLASE" in the last 168 hours. No results for input(s): "AMMONIA" in the last 168 hours. Coagulation Profile: Recent Labs  Lab 04/08/22 2010  INR 1.0   Cardiac Enzymes: No results for input(s): "CKTOTAL", "CKMB", "CKMBINDEX", "TROPONINI" in the last 168 hours. BNP (last 3 results) No results for input(s): "PROBNP" in the last 8760 hours. HbA1C: No results for input(s): "HGBA1C" in the last 72 hours. CBG: No results for input(s): "GLUCAP" in the last 168 hours. Lipid Profile: No results for input(s): "CHOL",  "HDL", "LDLCALC", "TRIG", "CHOLHDL", "LDLDIRECT" in the last 72 hours. Thyroid Function Tests: No results for input(s): "TSH", "T4TOTAL", "FREET4", "T3FREE", "THYROIDAB" in the last 72 hours. Anemia Panel: No results for input(s): "VITAMINB12", "FOLATE", "FERRITIN", "TIBC", "IRON", "RETICCTPCT" in the last 72 hours. Urine analysis:    Component Value Date/Time   BILIRUBINUR neg 11/07/2018 0932   PROTEINUR Negative 11/07/2018 0932   UROBILINOGEN 0.2 11/07/2018 0932   NITRITE neg 11/07/2018 0932   LEUKOCYTESUR Negative 11/07/2018 0932    Radiological Exams on Admission: CT ANGIO HEAD NECK W WO CM W PERF (CODE STROKE)  Result Date: 04/08/2022 CLINICAL DATA:  Vertigo with right facial droop EXAM: CT ANGIOGRAPHY HEAD AND NECK CT PERFUSION BRAIN TECHNIQUE: Multidetector CT imaging  of the head and neck was performed using the standard protocol during bolus administration of intravenous contrast. Multiplanar CT image reconstructions and MIPs were obtained to evaluate the vascular anatomy. Carotid stenosis measurements (when applicable) are obtained utilizing NASCET criteria, using the distal internal carotid diameter as the denominator. Multiphase CT imaging of the brain was performed following IV bolus contrast injection. Subsequent parametric perfusion maps were calculated using RAPID software. RADIATION DOSE REDUCTION: This exam was performed according to the departmental dose-optimization program which includes automated exposure control, adjustment of the mA and/or kV according to patient size and/or use of iterative reconstruction technique. CONTRAST:  122m OMNIPAQUE IOHEXOL 350 MG/ML SOLN COMPARISON:  None Available. FINDINGS: CTA NECK FINDINGS SKELETON: There is no bony spinal canal stenosis. No lytic or blastic lesion. OTHER NECK: Normal pharynx, larynx and major salivary glands. No cervical lymphadenopathy. Unremarkable thyroid gland. UPPER CHEST: No pneumothorax or pleural effusion. No nodules  or masses. AORTIC ARCH: There is no calcific atherosclerosis of the aortic arch. There is no aneurysm, dissection or hemodynamically significant stenosis of the visualized portion of the aorta. Conventional 3 vessel aortic branching pattern. The visualized proximal subclavian arteries are widely patent. RIGHT CAROTID SYSTEM: Normal without aneurysm, dissection or stenosis. LEFT CAROTID SYSTEM: Normal without aneurysm, dissection or stenosis. VERTEBRAL ARTERIES: Left dominant configuration. Both origins are clearly patent. There is no dissection, occlusion or flow-limiting stenosis to the skull base (V1-V3 segments). CTA HEAD FINDINGS POSTERIOR CIRCULATION: --Vertebral arteries: Normal V4 segments. --Inferior cerebellar arteries: Normal. --Basilar artery: Normal. --Superior cerebellar arteries: Normal. --Posterior cerebral arteries (PCA): Normal. ANTERIOR CIRCULATION: --Intracranial internal carotid arteries: Normal. --Anterior cerebral arteries (ACA): Normal. Both A1 segments are present. Patent anterior communicating artery (a-comm). --Middle cerebral arteries (MCA): Normal. VENOUS SINUSES: As permitted by contrast timing, patent. ANATOMIC VARIANTS: None Review of the MIP images confirms the above findings. CT Brain Perfusion Findings: ASPECTS: 10 CBF (<30%) Volume: 023mPerfusion (Tmax>6.0s) volume: 77m26mismatch Volume: 77mL4mfarction Location:None IMPRESSION: 1. No emergent large vessel occlusion or high-grade stenosis of the intracranial arteries. 2. Normal CT perfusion. Electronically Signed   By: KeviUlyses Jarred.   On: 04/08/2022 22:05   CT HEAD CODE STROKE WO CONTRAST  Result Date: 04/08/2022 CLINICAL DATA:  Code stroke.  Slurred speech and right facial droop EXAM: CT HEAD WITHOUT CONTRAST TECHNIQUE: Contiguous axial images were obtained from the base of the skull through the vertex without intravenous contrast. RADIATION DOSE REDUCTION: This exam was performed according to the departmental  dose-optimization program which includes automated exposure control, adjustment of the mA and/or kV according to patient size and/or use of iterative reconstruction technique. COMPARISON:  None Available. FINDINGS: Brain: There is no mass, hemorrhage or extra-axial collection. The size and configuration of the ventricles and extra-axial CSF spaces are normal. The brain parenchyma is normal, without evidence of acute or chronic infarction. Vascular: No abnormal hyperdensity of the major intracranial arteries or dural venous sinuses. No intracranial atherosclerosis. Skull: The visualized skull base, calvarium and extracranial soft tissues are normal. Sinuses/Orbits: No fluid levels or advanced mucosal thickening of the visualized paranasal sinuses. No mastoid or middle ear effusion. The orbits are normal. ASPECTS ([facility] Early CT Score) - Ganglionic level infarction (caudate, lentiform nuclei, internal capsule, insula, M1-M3 cortex): 7 - Supraganglionic infarction (M4-M6 cortex): 3 Total score (0-10 with 10 being normal): 10 IMPRESSION: 1. No acute intracranial abnormality. 2. ASPECTS is 10. These results were called by telephone at the time of interpretation on 04/08/2022 at 8:27 pm  to provider [facility] , who verbally acknowledged these results. Electronically Signed   By: Ulyses Jarred M.D.   On: 04/08/2022 20:27     Data Reviewed: Relevant notes from primary care and specialist visits, past discharge summaries as available in EHR, including Care Everywhere. Prior diagnostic testing as pertinent to current admission diagnoses Updated medications and problem lists for reconciliation ED course, including vitals, labs, imaging, treatment and response to treatment Triage notes, nursing and pharmacy notes and ED provider's notes Notable results as noted in HPI   Assessment and Plan: * Acute focal neurological deficit Permissive hypertension for first 24-48 hrs post stroke onset: Prn  Labetalol IV or Vasotec IV If BP greater than 220/120  Statins for LDL goal less than 70 ASA '81mg'$  daily, Plavix '75mg'$  daily x 3 weeks then monotherapy thereafter Telemetry, echo CTA head and neck negative Follow up MRI Avoid dextrose containing fluids, Maintain euglycemia, euthermia Neuro checks q4 hrs x 24 hrs and then per shift Head of bed 30 degrees Physical therapy/Occupational therapy/Speech therapy if failed dysphagia screen Follow official teleneurology consult and consult neurology consult to follow   Acute metabolic encephalopathy Patient presents with generalized weakness, somnolence, nausea and vomiting Labs reassuring, EKG nonacute Concern for posterior territory CVA Will get urine analysis EtOH level negative  will get UDS Patient on Benadryl 25, gabapentin 300, not on narcotics so low suspicion for medication related encephalopathy  Migraines Chronic daily headaches Patient follows with neurology   Cervical radiculopathy Follows with physiatry and has received epidural spinal injections in the past  HTN (hypertension) Hold antihypertensives to allow for permissive hypertension     DVT prophylaxis: Lovenox  Consults: none  Advance Care Planning: full code  Family Communication: Husband at bedside  Disposition Plan: Back to previous home environment  Severity of Illness: The appropriate patient status for this patient is OBSERVATION. Observation status is judged to be reasonable and necessary in order to provide the required intensity of service to ensure the patient's safety. The patient's presenting symptoms, physical exam findings, and initial radiographic and laboratory data in the context of their medical condition is felt to place them at decreased risk for further clinical deterioration. Furthermore, it is anticipated that the patient will be medically stable for discharge from the hospital within 2 midnights of admission.   Author: Athena Masse,  MD 04/08/2022 10:07 PM  For on call review [email or web address].

## 2022-04-08 NOTE — Assessment & Plan Note (Signed)
Hold antihypertensives to allow for permissive hypertension 

## 2022-04-09 ENCOUNTER — Observation Stay (HOSPITAL_BASED_OUTPATIENT_CLINIC_OR_DEPARTMENT_OTHER)
Admit: 2022-04-09 | Discharge: 2022-04-09 | Disposition: A | Discharge status: Home / Self Care | Payer: BC Managed Care – PPO | Attending: Internal Medicine | Admitting: Internal Medicine

## 2022-04-09 ENCOUNTER — Other Ambulatory Visit: Payer: Self-pay

## 2022-04-09 ENCOUNTER — Encounter: Payer: Self-pay | Admitting: Internal Medicine

## 2022-04-09 ENCOUNTER — Observation Stay: Payer: BC Managed Care – PPO

## 2022-04-09 DIAGNOSIS — I10 Essential (primary) hypertension: Secondary | ICD-10-CM | POA: Diagnosis not present

## 2022-04-09 DIAGNOSIS — R29818 Other symptoms and signs involving the nervous system: Secondary | ICD-10-CM | POA: Diagnosis not present

## 2022-04-09 LAB — ECHOCARDIOGRAM COMPLETE
AR max vel: 2.49 cm2
AV Area VTI: 2.93 cm2
AV Area mean vel: 2.18 cm2
AV Mean grad: 3 mmHg
AV Peak grad: 5.2 mmHg
Ao pk vel: 1.14 m/s
Area-P 1/2: 3.12 cm2
S' Lateral: 3.1 cm
Weight: 3520 oz

## 2022-04-09 LAB — LIPID PANEL
Cholesterol: 226 mg/dL — ABNORMAL HIGH (ref 0–200)
Cholesterol: 224 mg/dL — ABNORMAL HIGH (ref 0–200)
HDL: 81 mg/dL (ref >40)
HDL: 75 mg/dL (ref >40)
LDL Cholesterol: 133 mg/dL — ABNORMAL HIGH (ref 0–99)
LDL Cholesterol: 136 mg/dL — ABNORMAL HIGH (ref 0–99)
Total CHOL/HDL Ratio: 2.8 RATIO
Total CHOL/HDL Ratio: 3 RATIO
Triglycerides: 59 mg/dL (ref <150)
Triglycerides: 63 mg/dL (ref <150)
VLDL: 12 mg/dL (ref 0–40)
VLDL: 13 mg/dL (ref 0–40)

## 2022-04-09 LAB — URINALYSIS, COMPLETE (UACMP) WITH MICROSCOPIC
Bacteria, UA: NONE SEEN
Bilirubin Urine: NEGATIVE
Glucose, UA: NEGATIVE mg/dL
Hgb urine dipstick: NEGATIVE
Ketones, ur: NEGATIVE mg/dL
Leukocytes,Ua: NEGATIVE
Nitrite: NEGATIVE
Protein, ur: NEGATIVE mg/dL
Specific Gravity, Urine: 1.006 (ref 1.005–1.030)
Squamous Epithelial / HPF: NONE SEEN /HPF (ref 0–5)
WBC, UA: NONE SEEN WBC/hpf (ref 0–5)
pH: 6 (ref 5.0–8.0)

## 2022-04-09 LAB — URINE DRUG SCREEN, QUALITATIVE (ARMC ONLY)
Amphetamines, Ur Screen: NOT DETECTED
Barbiturates, Ur Screen: NOT DETECTED
Benzodiazepine, Ur Scrn: NOT DETECTED
Cannabinoid 50 Ng, Ur ~~LOC~~: POSITIVE — AB
Cocaine Metabolite,Ur ~~LOC~~: NOT DETECTED
MDMA (Ecstasy)Ur Screen: NOT DETECTED
Methadone Scn, Ur: NOT DETECTED
Opiate, Ur Screen: NOT DETECTED
Phencyclidine (PCP) Ur S: NOT DETECTED
Tricyclic, Ur Screen: NOT DETECTED

## 2022-04-09 LAB — HEMOGLOBIN A1C
Hgb A1c MFr Bld: 5.5 % (ref 4.8–5.6)
Mean Plasma Glucose: 111 mg/dL

## 2022-04-09 LAB — HIV ANTIBODY (ROUTINE TESTING W REFLEX): HIV Screen 4th Generation wRfx: NONREACTIVE

## 2022-04-09 MED ORDER — ALBUTEROL SULFATE (2.5 MG/3ML) 0.083% IN NEBU: 3.0000 mL | INHALATION_SOLUTION | Freq: Four times a day (QID) | RESPIRATORY_TRACT | Status: DC | PRN

## 2022-04-09 MED ORDER — IBUPROFEN 400 MG PO TABS
400.0000 mg | ORAL_TABLET | Freq: Four times a day (QID) | ORAL | Status: DC | PRN
  Administered 2022-04-09: 400 mg via ORAL
  Filled 2022-04-09: qty 1

## 2022-04-09 MED ORDER — ASPIRIN 81 MG PO TBEC: 81.0000 mg | DELAYED_RELEASE_TABLET | Freq: Every day | ORAL | 12 refills | Status: DC

## 2022-04-09 MED ORDER — ROSUVASTATIN CALCIUM 20 MG PO TABS: 20.0000 mg | ORAL_TABLET | Freq: Every day | ORAL | 0 refills | Status: DC

## 2022-04-09 NOTE — Evaluation (Signed)
Occupational Therapy Evaluation Patient Details Name: Jade Harris MRN: [id number] DOB: 1965/08/04 Today's Date: 04/09/2022   History of Present Illness Pt is a 57 y/o F admitted on 04/08/22 after presenting to the ED after her husband found her on the floor with slurred speech & R sided facial droop & generalized weakness. MRI shows no acute intracranial abnormality. PMH: HTN, migraines, chronic LBP, cervical spinal stenosis with cervical radiculopathy   Clinical Impression   Pt supine in bed with c/o headache pain but agreeable to participate with OT. Pt lives with family at and is Ind at baseline. Pt does endorse some R side facial numbness and L LE numbness most likely from baseline neck issues. Pt demonstrates mobility in room without use of AD or physical assistance. Coordination and strength is WFLs during session. Pt able to demonstrate LB dressing techniques while seated EOB independently. Pt does not need skilled OT intervention at this time and pt agrees. OT to complete order and sign off. No recommendations for discharge.      Recommendations for follow up therapy are one component of a multi-disciplinary discharge planning process, led by the attending physician.  Recommendations may be updated based on patient status, additional functional criteria and insurance authorization.   Follow Up Recommendations  No OT follow up     Assistance Recommended at Discharge Set up Supervision/Assistance     Functional Status Assessment  Patient has not had a recent decline in their functional status  Equipment Recommendations  None recommended by OT       Precautions / Restrictions Precautions Precautions: Fall Restrictions Weight Bearing Restrictions: No      Mobility Bed Mobility Overal bed mobility: Independent                  Transfers Overall transfer level: Independent                        Balance Overall balance assessment: Needs  assistance Sitting-balance support: Feet supported Sitting balance-Leahy Scale: Normal     Standing balance support: During functional activity Standing balance-Leahy Scale: Good                             ADL either performed or assessed with clinical judgement   ADL Overall ADL's : Independent                                             Vision Patient Visual Report: No change from baseline              Pertinent Vitals/Pain Pain Assessment Pain Assessment: 0-10 Pain Score: 7  Pain Location: Headache Pain Descriptors / Indicators: Discomfort Pain Intervention(s): Premedicated before session, Repositioned     Hand Dominance Right   Extremity/Trunk Assessment Upper Extremity Assessment Upper Extremity Assessment: Overall WFL for tasks assessed   Lower Extremity Assessment Lower Extremity Assessment: Overall WFL for tasks assessed   Cervical / Trunk Assessment Cervical / Trunk Assessment: Normal   Communication Communication Communication: No difficulties   Cognition Arousal/Alertness: Awake/alert Behavior During Therapy: WFL for tasks assessed/performed Overall Cognitive Status: Within Functional Limits for tasks assessed  General Comments  HR 85 bpm, SPO2 96%, BP in LUE 175/92 mmHg MAP 117 after gait            Home Living Family/patient expects to be discharged to:: Private residence Living Arrangements: Spouse/significant other Available Help at Discharge: Family Type of Home: House Home Access: Level entry     Home Layout: One level     Bathroom Shower/Tub: Tub/shower unit         Home Equipment: None          Prior Functioning/Environment Prior Level of Function : Independent/Modified Independent;Driving;Working/employed             Mobility Comments: Independent without AD, denies falls. 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                    OT Goals(Current goals can be found in the care plan section) Acute Rehab OT Goals Patient Stated Goal: to go home OT Goal Formulation: With patient Time For Goal Achievement: 04/09/22 Potential to Achieve Goals: Good  OT Frequency:         AM-PAC OT "6 Clicks" Daily Activity     Outcome Measure Help from another person eating meals?: None Help from another person taking care of personal grooming?: None Help from another person toileting, which includes using toliet, bedpan, or urinal?: None Help from another person bathing (including washing, rinsing, drying)?: None Help from another person to put on and taking off regular upper body clothing?: None Help from another person to put on and taking off regular lower body clothing?: None 6 Click Score: 24   End of Session Nurse Communication: Mobility status  Activity Tolerance: Patient tolerated treatment well Patient left: in bed;with call bell/phone within reach;with family/visitor present                   Time: [id number] OT Time Calculation (min): 13 min Charges:  OT General Charges $OT Visit: 1 Visit OT Evaluation $OT Eval Low Complexity: 1 Low  Darleen Crocker, MS, OTR/L , CBIS ascom [phone]  04/09/22, 12:34 PM

## 2022-04-09 NOTE — ED Notes (Signed)
Labs drawn, vitals updated, pt provided ginger ale and crackers, per request.

## 2022-04-09 NOTE — Progress Notes (Signed)
*  PRELIMINARY RESULTS* Echocardiogram 2D Echocardiogram has been performed.  Jade Harris 04/09/2022, 9:03 AM

## 2022-04-09 NOTE — ED Notes (Signed)
Pt taken to MRI  

## 2022-04-09 NOTE — Progress Notes (Signed)
PHARMACIST - PHYSICIAN COMMUNICATION  CONCERNING:  Enoxaparin (Lovenox) for DVT Prophylaxis    RECOMMENDATION: Patient was prescribed enoxaprin '40mg'$  q24 hours for VTE prophylaxis.   Filed Weights   04/08/22 2350  Weight: 100.2 kg (220 lb 14.4 oz)    Body mass index is 36.76 kg/m.  Estimated Creatinine Clearance: 89.9 mL/min (by C-G formula based on SCr of 0.8 mg/dL).   Based on [place] patient is candidate for enoxaparin 0.'5mg'$ /kg TBW SQ every 24 hours based on BMI being >30.  DESCRIPTION: Pharmacy has adjusted enoxaparin dose per [facility] policy.  Patient is now receiving enoxaparin 0.5 mg/kg every 24 hours   Renda Rolls, PharmD, [facility] 04/09/2022 12:12 AM

## 2022-04-09 NOTE — Discharge Summary (Signed)
Physician Discharge Summary  Jade Harris [id number] DOB: 02-Jun-1965 DOA: 04/08/2022  PCP: Glean Hess, MD  Admit date: 04/08/2022 Discharge date: 04/09/2022  Admitted From: Home Disposition:  Home  Recommendations for Outpatient Follow-up:  Follow up with PCP in 1-2 weeks   Home Health:No Equipment/Devices:None  Discharge Condition:Stable  CODE STATUS:FULL  Diet recommendation: Reg  Brief/Interim Summary: 57 y.o. female with medical history significant for hypertension, migraines with chronic daily headaches, chronic low back pain, cervical spinal stenosis with cervical radiculopathy, evaluated by both neurosurgery and physiatry back in September 2023, for which she received epidural spinal injection and referred to neurology at that time, who presents to the ED as a code stroke after patient was found on the floor by her husband with slurred speech a right-sided facial droop and generalized weakness,.  She was awake but somnolent.  He had apparently last seen her at 6 PM when she was in her usual state of health and returned to [facility] the aforementioned state at 6:30 PM.  She also had nausea and was vomiting. CT head Per code stroke recall showed no acute intracranial abnormality She was seen in consultation by neurologist who recommended CTA head and neck for possible posterior territory stroke as well as MRI, formal recommendations pending.  Per ED provider, tPA was not recommended as there were no clear symptoms of vertigo  MRI and CTA negative for acute infarct.  Seen in consultation by physical therapy and Occupational Therapy.  Patient did well.  No follow-up recommended.  2D echocardiogram demonstrated normal ejection fraction with grade 1 diastolic dysfunction.  Lipid panel demonstrates hyperlipidemia with elevated LDL.  Discussed with neurology consultant.  Cleared for discharge at this time.  Okay to continue aspirin 81 mg daily as antiplatelet strategy.  Will add  Crestor 20 mg daily to medication regimen.  Cleared for discharge home.  Follow-up outpatient PCP 1 to 2 weeks.    Discharge Diagnoses:  Principal Problem:   Acute focal neurological deficit Active Problems:   Acute metabolic encephalopathy   Migraines   Headache, chronic daily   Cervical radiculopathy   HTN (hypertension)  Acute neurologic deficit Hyperlipidemia Unclear etiology.  Unable to exclude TIA.  Patient is nonfocal on exam.  Back to baseline level of functioning.  Discussed with neurology.  Cleared for discharge at this time.  Do not recommend dual antiplatelet therapy.  Aspirin 81 mg daily as monotherapy.  Can resume remainder of home medication regimen.  Crestor 20 mg added for hyperlipidemia.  Follow-up outpatient PCP 1 to 2 weeks.  Acute metabolic encephalopathy Unknown etiology.  Possibly medication effect though no new changes.  Resolved at time of DC.  Discharge Instructions  Discharge Instructions     Diet - low sodium heart healthy   Complete by: As directed    Increase activity slowly   Complete by: As directed       Allergies as of 04/09/2022       Reactions   Sulfa Antibiotics Other (See Comments)        Medication List     STOP taking these medications    DULoxetine 30 MG capsule Commonly known as: CYMBALTA   fluticasone 50 MCG/ACT nasal spray Commonly known as: FLONASE   fluticasone-salmeterol 100-50 MCG/ACT Aepb Commonly known as: ADVAIR       TAKE these medications    acetaminophen 500 MG tablet Commonly known as: TYLENOL Take 500 mg by mouth every 6 (six) hours as needed.   albuterol 108 (  90 Base) MCG/ACT inhaler Commonly known as: VENTOLIN HFA Inhale 2 puffs into the lungs every 6 (six) hours as needed for wheezing or shortness of breath.   ALEVE PO Take by mouth daily.   aspirin EC 81 MG tablet Take 1 tablet (81 mg total) by mouth daily. Swallow whole. Start taking on: April 10, 2022   B COMPLEX PO Take 1 tablet by  mouth daily.   Calcipotriene-Betameth Diprop 0.005-0.064 % Foam Apply topically as needed. Eczema   diphenhydrAMINE 25 MG tablet Commonly known as: BENADRYL Take 25 mg by mouth every 6 (six) hours as needed.   EPINEPHrine 0.3 mg/0.3 mL Soaj injection Commonly known as: EpiPen 2-Pak Inject 0.3 mg into the muscle as needed for anaphylaxis.   gabapentin 300 MG capsule Commonly known as: NEURONTIN TAKE 1 CAPSULE(300 MG) BY MOUTH THREE TIMES DAILY   ibuprofen 400 MG tablet Commonly known as: ADVIL Take 400 mg by mouth every 6 (six) hours as needed.   rosuvastatin 20 MG tablet Commonly known as: Crestor Take 1 tablet (20 mg total) by mouth daily.   triamterene-hydrochlorothiazide 37.5-25 MG tablet Commonly known as: MAXZIDE-25 Take 1.5 tablets by mouth as needed.        Follow-up Information     Glean Hess, MD. Schedule an appointment as soon as possible for a visit in 1 week(s).   Specialty: Internal Medicine Contact information: [place] [address] [phone]                Allergies  Allergen Reactions   Sulfa Antibiotics Other (See Comments)    Consultations: Neurology   Procedures/Studies: ECHOCARDIOGRAM COMPLETE  Result Date: 04/09/2022    ECHOCARDIOGRAM REPORT   Patient Name:   Jade Harris Date of Exam: 04/09/2022 Medical Rec #:  [id number]          Height:       65.0 in Accession #:    [id number]         Weight:       220.0 lb Date of Birth:  [birth year]          BSA:          2.060 m Patient Age:    83 years           BP:           137/86 mmHg Patient Gender: F                  HR:           89 bpm. Exam Location:  [facility] Procedure: 2D Echo, Cardiac Doppler and Color Doppler Indications:     Stroke I63.9  History:         Patient has no prior history of Echocardiogram examinations. No                  cardiac hsitory listed in chart.  Sonographer:     Sherrie Sport Referring Phys:  [id number] Athena Masse Diagnosing  Phys: Kathlyn Sacramento MD  Sonographer Comments: Suboptimal apical window. IMPRESSIONS  1. Left ventricular ejection fraction, by estimation, is 60 to 65%. The left ventricle has normal function. The left ventricle has no regional wall motion abnormalities. Left ventricular diastolic parameters are consistent with Grade I diastolic dysfunction (impaired relaxation).  2. Right ventricular systolic function is normal. The right ventricular size is normal. There is normal pulmonary artery systolic pressure.  3. The mitral valve is normal in structure.  No evidence of mitral valve regurgitation. No evidence of mitral stenosis.  4. The aortic valve is normal in structure. Aortic valve regurgitation is not visualized. No aortic stenosis is present. FINDINGS  Left Ventricle: Left ventricular ejection fraction, by estimation, is 60 to 65%. The left ventricle has normal function. The left ventricle has no regional wall motion abnormalities. The left ventricular internal cavity size was normal in size. There is  no left ventricular hypertrophy. Left ventricular diastolic parameters are consistent with Grade I diastolic dysfunction (impaired relaxation). Right Ventricle: The right ventricular size is normal. No increase in right ventricular wall thickness. Right ventricular systolic function is normal. There is normal pulmonary artery systolic pressure. The tricuspid regurgitant velocity is 1.83 m/s, and  with an assumed right atrial pressure of 5 mmHg, the estimated right ventricular systolic pressure is [id number] mmHg. Left Atrium: Left atrial size was normal in size. Right Atrium: Right atrial size was normal in size. Pericardium: There is no evidence of pericardial effusion. Mitral Valve: The mitral valve is normal in structure. No evidence of mitral valve regurgitation. No evidence of mitral valve stenosis. Tricuspid Valve: The tricuspid valve is normal in structure. Tricuspid valve regurgitation is trivial. No evidence of  tricuspid stenosis. Aortic Valve: The aortic valve is normal in structure. Aortic valve regurgitation is not visualized. No aortic stenosis is present. Aortic valve mean gradient measures 3.0 mmHg. Aortic valve peak gradient measures 5.2 mmHg. Aortic valve area, by VTI measures 2.93 cm. Pulmonic Valve: The pulmonic valve was normal in structure. Pulmonic valve regurgitation is not visualized. No evidence of pulmonic stenosis. Aorta: The aortic root is normal in size and structure. Venous: The inferior vena cava was not well visualized. IAS/Shunts: No atrial level shunt detected by color flow Doppler.  LEFT VENTRICLE PLAX 2D LVIDd:         4.80 cm   Diastology LVIDs:         3.10 cm   LV e' medial:    8.16 cm/s LV PW:         0.90 cm   LV E/e' medial:  7.5 LV IVS:        1.00 cm   LV e' lateral:   8.16 cm/s LVOT diam:     2.00 cm   LV E/e' lateral: 7.5 LV SV:         57 LV SV Index:   28 LVOT Area:     3.14 cm  RIGHT VENTRICLE RV Basal diam:  3.00 cm RV Mid diam:    2.90 cm RV S prime:     13.40 cm/s TAPSE (M-mode): 1.7 cm LEFT ATRIUM             Index        RIGHT ATRIUM           Index LA diam:        3.50 cm 1.70 cm/m   RA Area:     14.80 cm LA Vol (A2C):   77.4 ml 37.57 ml/m  RA Volume:   33.40 ml  16.21 ml/m LA Vol (A4C):   58.7 ml 28.50 ml/m LA Biplane Vol: 69.0 ml 33.50 ml/m  AORTIC VALVE AV Area (Vmax):    2.49 cm AV Area (Vmean):   2.18 cm AV Area (VTI):     2.93 cm AV Vmax:           114.00 cm/s AV Vmean:          81.700 cm/s AV VTI:  0.194 m AV Peak Grad:      5.2 mmHg AV Mean Grad:      3.0 mmHg LVOT Vmax:         90.20 cm/s LVOT Vmean:        56.800 cm/s LVOT VTI:          0.181 m LVOT/AV VTI ratio: 0.93  AORTA Ao Root diam: 3.30 cm MITRAL VALVE               TRICUSPID VALVE MV Area (PHT): 3.12 cm    TR Peak grad:   13.4 mmHg MV Decel Time: 243 msec    TR Vmax:        183.00 cm/s MV E velocity: 61.30 cm/s MV A velocity: 94.90 cm/s  SHUNTS MV E/A ratio:  0.65        Systemic VTI:   0.18 m                            Systemic Diam: 2.00 cm Kathlyn Sacramento MD Electronically signed by Kathlyn Sacramento MD Signature Date/Time: 04/09/2022/12:51:45 PM    Final    MR BRAIN WO CONTRAST  Result Date: 04/09/2022 CLINICAL DATA:  Acute neurologic deficit EXAM: MRI HEAD WITHOUT CONTRAST TECHNIQUE: Multiplanar, multiecho pulse sequences of the brain and surrounding structures were obtained without intravenous contrast. COMPARISON:  None Available. FINDINGS: Brain: No acute infarct, mass effect or extra-axial collection. No chronic microhemorrhage or siderosis. There is multifocal hyperintense T2-weighted signal within the white matter. Parenchymal volume and CSF spaces are normal. The midline structures are normal. Vascular: Normal flow voids. Skull and upper cervical spine: Normal marrow signal. Sinuses/Orbits: Negative. Other: None. IMPRESSION: 1. No acute intracranial abnormality. 2. Mild chronic microangiopathic white matter changes. Electronically Signed   By: Ulyses Jarred M.D.   On: 04/09/2022 01:14   CT ANGIO HEAD NECK W WO CM W PERF (CODE STROKE)  Result Date: 04/08/2022 CLINICAL DATA:  Vertigo with right facial droop EXAM: CT ANGIOGRAPHY HEAD AND NECK CT PERFUSION BRAIN TECHNIQUE: Multidetector CT imaging of the head and neck was performed using the standard protocol during bolus administration of intravenous contrast. Multiplanar CT image reconstructions and MIPs were obtained to evaluate the vascular anatomy. Carotid stenosis measurements (when applicable) are obtained utilizing NASCET criteria, using the distal internal carotid diameter as the denominator. Multiphase CT imaging of the brain was performed following IV bolus contrast injection. Subsequent parametric perfusion maps were calculated using RAPID software. RADIATION DOSE REDUCTION: This exam was performed according to the departmental dose-optimization program which includes automated exposure control, adjustment of the mA and/or kV  according to patient size and/or use of iterative reconstruction technique. CONTRAST:  130m OMNIPAQUE IOHEXOL 350 MG/ML SOLN COMPARISON:  None Available. FINDINGS: CTA NECK FINDINGS SKELETON: There is no bony spinal canal stenosis. No lytic or blastic lesion. OTHER NECK: Normal pharynx, larynx and major salivary glands. No cervical lymphadenopathy. Unremarkable thyroid gland. UPPER CHEST: No pneumothorax or pleural effusion. No nodules or masses. AORTIC ARCH: There is no calcific atherosclerosis of the aortic arch. There is no aneurysm, dissection or hemodynamically significant stenosis of the visualized portion of the aorta. Conventional 3 vessel aortic branching pattern. The visualized proximal subclavian arteries are widely patent. RIGHT CAROTID SYSTEM: Normal without aneurysm, dissection or stenosis. LEFT CAROTID SYSTEM: Normal without aneurysm, dissection or stenosis. VERTEBRAL ARTERIES: Left dominant configuration. Both origins are clearly patent. There is no dissection, occlusion or flow-limiting stenosis to the skull  base (V1-V3 segments). CTA HEAD FINDINGS POSTERIOR CIRCULATION: --Vertebral arteries: Normal V4 segments. --Inferior cerebellar arteries: Normal. --Basilar artery: Normal. --Superior cerebellar arteries: Normal. --Posterior cerebral arteries (PCA): Normal. ANTERIOR CIRCULATION: --Intracranial internal carotid arteries: Normal. --Anterior cerebral arteries (ACA): Normal. Both A1 segments are present. Patent anterior communicating artery (a-comm). --Middle cerebral arteries (MCA): Normal. VENOUS SINUSES: As permitted by contrast timing, patent. ANATOMIC VARIANTS: None Review of the MIP images confirms the above findings. CT Brain Perfusion Findings: ASPECTS: 10 CBF (<30%) Volume: 33m Perfusion (Tmax>6.0s) volume: 085mMismatch Volume: 36m63mnfarction Location:None IMPRESSION: 1. No emergent large vessel occlusion or high-grade stenosis of the intracranial arteries. 2. Normal CT perfusion.  Electronically Signed   By: KevUlyses JarredD.   On: 04/08/2022 22:05   CT HEAD CODE STROKE WO CONTRAST  Result Date: 04/08/2022 CLINICAL DATA:  Code stroke.  Slurred speech and right facial droop EXAM: CT HEAD WITHOUT CONTRAST TECHNIQUE: Contiguous axial images were obtained from the base of the skull through the vertex without intravenous contrast. RADIATION DOSE REDUCTION: This exam was performed according to the departmental dose-optimization program which includes automated exposure control, adjustment of the mA and/or kV according to patient size and/or use of iterative reconstruction technique. COMPARISON:  None Available. FINDINGS: Brain: There is no mass, hemorrhage or extra-axial collection. The size and configuration of the ventricles and extra-axial CSF spaces are normal. The brain parenchyma is normal, without evidence of acute or chronic infarction. Vascular: No abnormal hyperdensity of the major intracranial arteries or dural venous sinuses. No intracranial atherosclerosis. Skull: The visualized skull base, calvarium and extracranial soft tissues are normal. Sinuses/Orbits: No fluid levels or advanced mucosal thickening of the visualized paranasal sinuses. No mastoid or middle ear effusion. The orbits are normal. ASPECTS ([facility] Early CT Score) - Ganglionic level infarction (caudate, lentiform nuclei, internal capsule, insula, M1-M3 cortex): 7 - Supraganglionic infarction (M4-M6 cortex): 3 Total score (0-10 with 10 being normal): 10 IMPRESSION: 1. No acute intracranial abnormality. 2. ASPECTS is 10. These results were called by telephone at the time of interpretation on 04/08/2022 at 8:27 pm to provider [facility] these results. Electronically Signed   By: KevUlyses JarredD.   On: 04/08/2022 20:27      Subjective: Seen and examined on the day of discharge.  Stable no distress.  Back to baseline.  Stable for discharge home.  Discharge  Exam: Vitals:   04/09/22 0600 04/09/22 0800  BP:  139/77  Pulse:  73  Resp:  15  Temp: 97.6 F (36.4 C) 98.1 F (36.7 C)  SpO2:  98%   Vitals:   04/09/22 0000 04/09/22 0100 04/09/22 0600 04/09/22 0800  BP: 137/86   139/77  Pulse: 89   73  Resp: '17 18  15  '$ Temp:  98.4 F (36.9 C) 97.6 F (36.4 C) 98.1 F (36.7 C)  TempSrc:  Oral Oral   SpO2: 94%   98%  Weight:        General: Pt is alert, awake, not in acute distress Cardiovascular: RRR, S1/S2 +, no rubs, no gallops Respiratory: CTA bilaterally, no wheezing, no rhonchi Abdominal: Soft, NT, ND, bowel sounds + Extremities: no edema, no cyanosis    The results of significant diagnostics from this hospitalization (including imaging, microbiology, ancillary and laboratory) are listed below for reference.     Microbiology: No results found for this or any previous visit (from the past 240 hour(s)).   Labs: BNP (last 3 results) No results  for input(s): "BNP" in the last 8760 hours. Basic Metabolic Panel: Recent Labs  Lab 04/08/22 2010  NA 136  K 3.7  CL 104  CO2 23  GLUCOSE 124*  BUN 14  CREATININE 0.80  CALCIUM 9.1   Liver Function Tests: Recent Labs  Lab 04/08/22 2010  AST 20  ALT 17  ALKPHOS 63  BILITOT 0.6  PROT 7.4  ALBUMIN 4.1   No results for input(s): "LIPASE", "AMYLASE" in the last 168 hours. No results for input(s): "AMMONIA" in the last 168 hours. CBC: Recent Labs  Lab 04/08/22 2010  WBC 8.8  NEUTROABS 6.0  HGB 12.2  HCT 38.3  MCV 89.9  PLT 263   Cardiac Enzymes: No results for input(s): "CKTOTAL", "CKMB", "CKMBINDEX", "TROPONINI" in the last 168 hours. BNP: Invalid input(s): "POCBNP" CBG: Recent Labs  Lab 04/08/22 2007  GLUCAP 120*   D-Dimer No results for input(s): "DDIMER" in the last 72 hours. Hgb A1c No results for input(s): "HGBA1C" in the last 72 hours. Lipid Profile Recent Labs    04/08/22 2314 04/09/22 0750  CHOL 226* 224*  HDL 81 75  LDLCALC 133* 136*   TRIG 59 63  CHOLHDL 2.8 3.0   Thyroid function studies No results for input(s): "TSH", "T4TOTAL", "T3FREE", "THYROIDAB" in the last 72 hours.  Invalid input(s): "FREET3" Anemia work up No results for input(s): "VITAMINB12", "FOLATE", "FERRITIN", "TIBC", "IRON", "RETICCTPCT" in the last 72 hours. Urinalysis    Component Value Date/Time   COLORURINE STRAW (A) 04/09/2022 1058   APPEARANCEUR CLEAR (A) 04/09/2022 1058   LABSPEC 1.006 04/09/2022 1058   PHURINE 6.0 04/09/2022 1058   GLUCOSEU NEGATIVE 04/09/2022 1058   HGBUR NEGATIVE 04/09/2022 1058   BILIRUBINUR NEGATIVE 04/09/2022 1058   BILIRUBINUR neg 11/07/2018 0932   KETONESUR NEGATIVE 04/09/2022 1058   PROTEINUR NEGATIVE 04/09/2022 1058   UROBILINOGEN 0.2 11/07/2018 0932   NITRITE NEGATIVE 04/09/2022 1058   LEUKOCYTESUR NEGATIVE 04/09/2022 1058   Sepsis Labs Recent Labs  Lab 04/08/22 2010  WBC 8.8   Microbiology No results found for this or any previous visit (from the past 240 hour(s)).   Time coordinating discharge: Over 30 minutes  SIGNED:   Sidney Ace, MD  [facility] 04/09/2022, 1:27 PM Pager   If 7PM-7AM, please contact night-coverage

## 2022-04-09 NOTE — Evaluation (Signed)
Physical Therapy Evaluation Patient Details Name: Jade Harris MRN: [id number] DOB: [birth year] Today's Date: 04/09/2022  History of Present Illness  Pt is a 57 y/o F admitted on 04/08/22 after presenting to the ED after her husband found her on the floor with slurred speech & R sided facial droop & generalized weakness. MRI shows no acute intracranial abnormality. PMH: HTN, migraines, chronic LBP, cervical spinal stenosis with cervical radiculopathy  Clinical Impression  Pt seen for PT evaluation with pt agreeable. Prior to admission pt was independent without AD, working & driving. On this date, pt endorses 8/10 HA as well as neck pain & LUE tingling but reports these are chronic and occur with HA. Pt is able to complete bed mobility & transfers independently, ambulating in hallway without AD with supervision. Pt does demonstrate decreased heel strike during gait & intermittently reaches for objects for support despite no overt LOB. Will continue to follow pt acutely to address gait, endurance, & balance.      Recommendations for follow up therapy are one component of a multi-disciplinary discharge planning process, led by the attending physician.  Recommendations may be updated based on patient status, additional functional criteria and insurance authorization.  Follow Up Recommendations No PT follow up      Assistance Recommended at Discharge PRN  Patient can return home with the following  Assistance with cooking/housework    Equipment Recommendations None recommended by PT  Recommendations for Other Services       Functional Status Assessment Patient has had a recent decline in their functional status and demonstrates the ability to make significant improvements in function in a reasonable and predictable amount of time.     Precautions / Restrictions Precautions Precautions: Fall Restrictions Weight Bearing Restrictions: No      Mobility  Bed Mobility Overal bed  mobility: Independent             General bed mobility comments: supine<>sit with HOB slightly elevated    Transfers Overall transfer level: Independent                 General transfer comment: STS from EOB    Ambulation/Gait Ambulation/Gait assistance: Supervision Gait Distance (Feet): 150 Feet Assistive device: None Gait Pattern/deviations: Decreased dorsiflexion - right, Decreased dorsiflexion - left Gait velocity: decreased     General Gait Details: decreased heel strike BLE; pt intermittently reaches for UE support but without LOB  Stairs            Wheelchair Mobility    Modified Rankin (Stroke Patients Only)       Balance Overall balance assessment: Needs assistance Sitting-balance support: Feet supported Sitting balance-Leahy Scale: Normal     Standing balance support: During functional activity Standing balance-Leahy Scale: Good                               Pertinent Vitals/Pain Pain Assessment Pain Assessment: 0-10 Pain Score: 8  Pain Location: Headache Pain Descriptors / Indicators: Discomfort Pain Intervention(s): Monitored during session (notified nurse of pt's request for pain medication)    Home Living Family/patient expects to be discharged to:: Private residence Living Arrangements: Spouse/significant other Available Help at Discharge: Family Type of Home: House Home Access: Level entry       Home Layout: One level        Prior Function Prior Level of Function : Independent/Modified Independent;Driving;Working/employed  Mobility Comments: Independent without AD, denies falls. 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       Hand Dominance   Dominant Hand: Right    Extremity/Trunk Assessment   Upper Extremity Assessment Upper Extremity Assessment: Overall WFL for tasks assessed (BUE finger opposition WFL; pt endorses LUE tingling but notes this is common with migraines)    Lower  Extremity Assessment Lower Extremity Assessment: Overall WFL for tasks assessed    Cervical / Trunk Assessment Cervical / Trunk Assessment: Normal  Communication   Communication: No difficulties  Cognition Arousal/Alertness: Awake/alert Behavior During Therapy: WFL for tasks assessed/performed Overall Cognitive Status: Within Functional Limits for tasks assessed                                          General Comments General comments (skin integrity, edema, etc.): HR 85 bpm, SPO2 96%, BP in LUE 175/92 mmHg MAP 117 after gait    Exercises     Assessment/Plan    PT Assessment Patient needs continued PT services  PT Problem List Decreased balance;Decreased mobility;Decreased activity tolerance       PT Treatment Interventions DME instruction;Therapeutic exercise;Gait training;Balance training;Stair training;Neuromuscular re-education;Functional mobility training;Therapeutic activities;Patient/family education    PT Goals (Current goals can be found in the Care Plan section)  Acute Rehab PT Goals Patient Stated Goal: decreased HA pain PT Goal Formulation: With patient Time For Goal Achievement: 04/23/22 Potential to Achieve Goals: Good    Frequency Min 2X/week     Co-evaluation               AM-PAC PT "6 Clicks" Mobility  Outcome Measure Help needed turning from your back to your side while in a flat bed without using bedrails?: None Help needed moving from lying on your back to sitting on the side of a flat bed without using bedrails?: None Help needed moving to and from a bed to a chair (including a wheelchair)?: A Little Help needed standing up from a chair using your arms (e.g., wheelchair or bedside chair)?: None Help needed to walk in hospital room?: A Little Help needed climbing 3-5 steps with a railing? : A Little 6 Click Score: 21    End of Session   Activity Tolerance: Patient tolerated treatment well (limited by HA) Patient left:  in bed;with family/visitor present Nurse Communication: Patient requests pain meds PT Visit Diagnosis: Unsteadiness on feet (R26.81)    Time: [id number] PT Time Calculation (min) (ACUTE ONLY): 13 min   Charges:   PT Evaluation $PT Eval Moderate Complexity: 1 Mod          Lavone Nian, PT, DPT 04/09/22, 9:44 AM   Jade Harris 04/09/2022, 9:43 AM

## 2022-04-10 ENCOUNTER — Telehealth: Payer: Self-pay

## 2022-04-10 NOTE — Transitions of Care (Post Inpatient/ED Visit) (Signed)
   04/10/2022  Name: Jade Harris MRN: [id number] DOB: 03-03-1965  Today's TOC FU Call Status: Today's TOC FU Call Status:: Unsuccessul Call (1st Attempt) Unsuccessful Call (1st Attempt) Date: 04/10/22  Attempted to reach the patient regarding the most recent Inpatient/ED visit.  Follow Up Plan: Additional outreach attempts will be made to reach the patient to complete the Transitions of Care (Post Inpatient/ED visit) call.   Signature Juanda Crumble, [place] Direct Dial [phone]

## 2022-04-11 NOTE — Transitions of Care (Post Inpatient/ED Visit) (Signed)
   04/11/2022  Name: Jade Harris MRN: [id number] DOB: 1965-12-29  Today's TOC FU Call Status: Today's TOC FU Call Status:: Successful TOC FU Call Competed Unsuccessful Call (1st Attempt) Date: 04/10/22 [facility] FU Call Complete Date: 04/11/22  Transition Care Management Follow-up Telephone Call Date of Discharge: 04/09/22 Discharge Facility: [facility] Type of Discharge: Inpatient Admission Primary Inpatient Discharge Diagnosis:: altered mental status How have you been since you were released from the hospital?: Better Any questions or concerns?: No  Items Reviewed: Did you receive and understand the discharge instructions provided?: Yes Medications obtained and verified?: Yes (Medications Reviewed) Any new allergies since your discharge?: No Dietary orders reviewed?: Yes Do you have support at home?: Yes People in Home: spouse  Home Care and Equipment/Supplies: [place] Ordered?: NA Any new equipment or medical supplies ordered?: NA  Functional Questionnaire: Do you need assistance with bathing/showering or dressing?: No Do you need assistance with meal preparation?: No Do you need assistance with eating?: No Do you have difficulty maintaining continence: No Do you need assistance with getting out of bed/getting out of a chair/moving?: No Do you have difficulty managing or taking your medications?: No  Folllow up appointments reviewed: PCP Follow-up appointment confirmed?: Yes Date of PCP follow-up appointment?: 04/16/22 Follow-up Provider: Dr Brand [facility] Follow-up appointment confirmed?: NA Do you need transportation to your follow-up appointment?: No Do you understand care options if your condition(s) worsen?: Yes-patient verbalized understanding    [place], [place] Direct Dial [phone]

## 2022-04-16 ENCOUNTER — Ambulatory Visit: Payer: BC Managed Care – PPO | Admitting: Internal Medicine

## 2022-04-16 ENCOUNTER — Encounter: Payer: Self-pay | Admitting: Internal Medicine

## 2022-04-16 VITALS — BP 130/86 | HR 92 | Ht 65.0 in | Wt 211.6 lb

## 2022-04-16 DIAGNOSIS — G43E19 Chronic migraine with aura, intractable, without status migrainosus: Secondary | ICD-10-CM

## 2022-04-16 DIAGNOSIS — G459 Transient cerebral ischemic attack, unspecified: Secondary | ICD-10-CM

## 2022-04-16 MED ORDER — ROSUVASTATIN CALCIUM 5 MG PO TABS: 5.0000 mg | ORAL_TABLET | Freq: Every day | ORAL | 1 refills | Status: DC

## 2022-04-16 NOTE — Progress Notes (Unsigned)
Date:  04/16/2022   Name:  Jade Harris   DOB:  [birth year]   MRN:  [id number]   Chief Complaint: Hospitalization Follow-up Patent evaluated in ED on 04/08/22 for AMS.  Admitted 04/08/22 to 04/09/22. TOC call on 04/10/22.  Patient presented to the emergency department with strokelike symptoms. Activated code stroke prior to arrival. Patient's last known well was at 6:00 PM. Patient was seen by her husband and then when he went outside and came back in 69 she was lying on the ground. Patient was altered, had slurred speech and he noted a mild facial droop. Patient was complaining of nausea vomiting. Denied any dizziness just states that she does not feel well. States that she feels weak all over. Unable to ambulate but unable to further state whether she is having any vertigo symptoms. Denies any similar symptoms in the past. Denies any falls or trauma. Denies any chest pain. States that she has chronic neck pain and she is followed by neurosurgery for that. States she takes gabapentin intermittently but no other sedating medications. Not on anticoagulation.   DC summary: MRI and CTA negative for acute infarct. Seen in consultation by physical therapy and Occupational Therapy. Patient did well. No follow-up recommended. 2D echocardiogram demonstrated normal ejection fraction with grade 1 diastolic dysfunction. Lipid panel demonstrates hyperlipidemia with elevated LDL. Discussed with neurology consultant. Cleared for discharge at this time. Okay to continue aspirin 81 mg daily as antiplatelet strategy. Will add Crestor 20 mg daily to medication regimen. Cleared for discharge home. Follow-up outpatient PCP 1 to 2 weeks.   CT Brain: IMPRESSION: 1. No emergent large vessel occlusion or high-grade stenosis of the intracranial arteries. 2. Normal CT perfusion.  MRI Brain; IMPRESSION: 1. No acute intracranial abnormality. 2. Mild chronic microangiopathic white matter changes.  Migraine  This is  a recurrent problem. The problem occurs daily. The problem has been unchanged. The pain is located in the Left unilateral region. The pain quality is similar to prior headaches. Associated symptoms include numbness (tingling of left face at times, both hands/fingers). Pertinent negatives include no abdominal pain, dizziness or fever. Associated symptoms comments: Neck pain and radiculopathy felt to be contributing but no benefit from [facility] and other cervical treatments..    Lab Results  Component Value Date   NA 136 04/08/2022   K 3.7 04/08/2022   CO2 23 04/08/2022   GLUCOSE 124 (H) 04/08/2022   BUN 14 04/08/2022   CREATININE 0.80 04/08/2022   CALCIUM 9.1 04/08/2022   EGFR 85 12/05/2020   GFRNONAA >60 04/08/2022   Lab Results  Component Value Date   CHOL 224 (H) 04/09/2022   HDL 75 04/09/2022   LDLCALC 136 (H) 04/09/2022   TRIG 63 04/09/2022   CHOLHDL 3.0 04/09/2022   Lab Results  Component Value Date   TSH 1.070 12/05/2020   Lab Results  Component Value Date   HGBA1C 5.5 04/08/2022   Lab Results  Component Value Date   WBC 8.8 04/08/2022   HGB 12.2 04/08/2022   HCT 38.3 04/08/2022   MCV 89.9 04/08/2022   PLT 263 04/08/2022   Lab Results  Component Value Date   ALT 17 04/08/2022   AST 20 04/08/2022   ALKPHOS 63 04/08/2022   BILITOT 0.6 04/08/2022   Lab Results  Component Value Date   25OHVITD2 <1.0 01/25/2020   25OHVITD3 29 01/25/2020   VD25OH 29.0 (L) 12/05/2020     Review of Systems  Constitutional:  Positive for fatigue.  Negative for chills, fever and unexpected weight change.  HENT:  Positive for voice change (becomes hoarse as the day goes on). Negative for trouble swallowing.   Respiratory:  Negative for chest tightness and shortness of breath.   Cardiovascular:  Negative for chest pain, palpitations and leg swelling.  Gastrointestinal:  Negative for abdominal pain and constipation.  Musculoskeletal:  Negative for arthralgias.  Skin:  Negative for color  change and rash.  Neurological:  Positive for numbness (tingling of left face at times, both hands/fingers) and headaches. Negative for dizziness and light-headedness.  Psychiatric/Behavioral:  Positive for dysphoric mood and sleep disturbance. The patient is nervous/anxious.     Patient Active Problem List   Diagnosis Date Noted   TIA (transient ischemic attack) 04/17/2022   HTN (hypertension) 04/08/2022   Acute focal neurological deficit 04/08/2022   Migraines 123456   Acute metabolic encephalopathy 123456   Environmental allergies 06/20/2021   S/P TAH (total abdominal hysterectomy) 12/04/2020   Chronic sacroiliac joint pain (Right) 02/24/2020   Other spondylosis, sacral and sacrococcygeal region 02/24/2020   Enthesopathy of sacroiliac joint (Right) 02/24/2020   Vitamin D insufficiency 02/03/2020   Lumbar facet hypertrophy 02/03/2020   Cervical facet hypertrophy (Multilevel) (Bilateral) 02/03/2020   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) 02/03/2020   History of marijuana use 02/03/2020   Spondylosis without myelopathy or radiculopathy, lumbosacral region 02/03/2020   Chronic low back pain (1ry area of Pain) (Bilateral) (R>L) w/o sciatica 01/25/2020   Lumbosacral facet syndrome (Bilateral) (R>L) 01/25/2020   Chronic musculoskeletal pain 01/25/2020   Chronic lower extremity pain (2ry area of Pain) (Right) 01/25/2020   Chronic groin pain (Intermittent) (Right) 01/25/2020   Lumbosacral radiculopathy/radiculitis at L2 (Right) 01/25/2020   Chronic neck pain (Bilateral) (L>R) 01/25/2020   Cervicalgia 01/25/2020   Cervicogenic headache (Left) 01/25/2020   Cervico-occipital neuralgia (Left) 01/25/2020   Cervical radiculopathy 01/25/2020   Numbness and tingling of upper extremity (Left) 01/25/2020   DDD (degenerative disc disease), cervical 01/25/2020   DDD (degenerative disc disease), lumbosacral 01/25/2020   Discogenic low back pain 01/25/2020   Chronic  pain syndrome 01/24/2020   Disorder of skeletal system 01/24/2020   Slow transit constipation 11/25/2018   Venous insufficiency of both lower extremities 11/25/2018   Eczema 08/23/2017   Headache, chronic daily 09/20/2015   Dyslipidemia 07/04/2014   Depression, major, single episode, 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   Menopause 07/04/2014   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   Idiopathic insomnia 07/04/2014    Allergies  Allergen Reactions   Sulfa Antibiotics Other (See Comments)    Past Surgical History:  Procedure Laterality Date   APPENDECTOMY     MOUTH SURGERY     NASAL SINUS SURGERY  04/2014   TONSILLECTOMY     TOTAL ABDOMINAL HYSTERECTOMY W/ BILATERAL SALPINGOOPHORECTOMY     wtih oophorectomy    Social History   Tobacco Use   Smoking status: Former    Types: Cigarettes    Quit date: 1989    Years since quitting: 35.2   Smokeless tobacco: Never  Vaping Use   Vaping Use: Never used  Substance Use Topics   Alcohol use: Yes    Alcohol/week: 0.0 standard drinks of alcohol    Comment: occasional   Drug use: No     Medication list has been reviewed and updated.  Current Meds  Medication Sig   acetaminophen (TYLENOL) 500 MG tablet Take 500 mg by mouth every 6 (six) hours as needed.   albuterol (  VENTOLIN HFA) 108 (90 Base) MCG/ACT inhaler Inhale 2 puffs into the lungs every 6 (six) hours as needed for wheezing or shortness of breath.   B Complex Vitamins (B COMPLEX PO) Take 1 tablet by mouth daily.   Calcipotriene-Betameth Diprop 0.005-0.064 % FOAM Apply topically as needed. Eczema   diphenhydrAMINE (BENADRYL) 25 MG tablet Take 25 mg by mouth every 6 (six) hours as needed.   EPINEPHrine (EPIPEN 2-PAK) 0.3 mg/0.3 mL IJ SOAJ injection Inject 0.3 mg into the muscle as needed for anaphylaxis.   gabapentin (NEURONTIN) 300 MG capsule TAKE 1 CAPSULE(300 MG) BY MOUTH THREE TIMES DAILY   ibuprofen (ADVIL) 400 MG tablet Take 400 mg by mouth every 6  (six) hours as needed.   Naproxen Sodium (ALEVE PO) Take by mouth daily.   rosuvastatin (CRESTOR) 5 MG tablet Take 1 tablet (5 mg total) by mouth daily.   triamterene-hydrochlorothiazide (MAXZIDE-25) 37.5-25 MG tablet Take 1.5 tablets by mouth as needed.   [DISCONTINUED] aspirin EC 81 MG tablet Take 1 tablet (81 mg total) by mouth daily. Swallow whole.   [DISCONTINUED] rosuvastatin (CRESTOR) 20 MG tablet Take 1 tablet (20 mg total) by mouth daily.       04/16/2022    2:04 PM 01/30/2022    4:17 PM 01/17/2022    9:23 AM 06/20/2021    2:31 PM  GAD 7 : Generalized Anxiety Score  Nervous, Anxious, on Edge 0 0 0 0  Control/stop worrying 0 0 0 0  Worry too much - different things 0 0 0 0  Trouble relaxing 0 0 0 0  Restless 0 0 0 0  Easily annoyed or irritable 0 0 0 0  Afraid - awful might happen 0 0 0 0  Total GAD 7 Score 0 0 0 0  Anxiety Difficulty Not difficult at all Not difficult at all Not difficult at all Not difficult at all       04/16/2022    2:04 PM 01/30/2022    4:17 PM 01/17/2022    9:23 AM  Depression screen PHQ 2/9  Decreased Interest 0 0 0  Down, Depressed, Hopeless 0 0 0  PHQ - 2 Score 0 0 0  Altered sleeping 0 0 0  Tired, decreased energy 0 0 0  Change in appetite 0 0 0  Feeling bad or failure about yourself  0 0 0  Trouble concentrating 0 0 0  Moving slowly or fidgety/restless 0 0 0  Suicidal thoughts 0 0 0  PHQ-9 Score 0 0 0  Difficult doing work/chores Not difficult at all Not difficult at all Not difficult at all    BP Readings from Last 3 Encounters:  04/16/22 130/86  04/09/22 (!) 146/75  01/30/22 (!) 138/100    Physical Exam Vitals and nursing note reviewed.  Constitutional:      General: She is not in acute distress.    Appearance: Normal appearance. She is well-developed.  HENT:     Head: Normocephalic and atraumatic.  Neck:     Vascular: No carotid bruit.     Comments: Voice hoarse Cardiovascular:     Rate and Rhythm: Normal rate and regular  rhythm.  Pulmonary:     Effort: Pulmonary effort is normal. No respiratory distress.     Breath sounds: No wheezing or rhonchi.  Musculoskeletal:     Cervical back: Tenderness present.     Right lower leg: No edema.     Left lower leg: No edema.  Lymphadenopathy:  Cervical: No cervical adenopathy.  Skin:    General: Skin is warm and dry.     Capillary Refill: Capillary refill takes less than 2 seconds.     Findings: No rash.  Neurological:     Mental Status: She is alert and oriented to person, place, and time. Mental status is at baseline.     Sensory: No sensory deficit.     Motor: No weakness.  Psychiatric:        Mood and Affect: Mood normal.        Behavior: Behavior normal.     Wt Readings from Last 3 Encounters:  04/16/22 211 lb 9.6 oz (96 kg)  04/08/22 220 lb 14.4 oz (100.2 kg)  04/08/22 220 lb (99.8 kg)    BP 130/86   Pulse 92   Ht 5\' 5"  (1.651 m)   Wt 211 lb 9.6 oz (96 kg)   SpO2 96%   BMI 35.21 kg/m   Assessment and Plan:  Problem List Items Addressed This Visit       Cardiovascular and Mediastinum   Migraines    Still having left sided HAs Sleep works the best - did not respond to Imitrex or Tramadol Will try samples of Nurtec Refer to Dr. Melrose Nakayama      Relevant Medications   rosuvastatin (CRESTOR) 5 MG tablet   Other Relevant Orders   Ambulatory referral to Neurology   TIA (transient ischemic attack) - Primary    She did not start ASA or statin due to uncertainty about the diagnosis She likely had a TIA and we will optimize prevention  She will begin ECASA 81 mg daily and Crestor 5 mg daily      Relevant Medications   rosuvastatin (CRESTOR) 5 MG tablet    Return in about 3 months (around 07/17/2022) for cholesterol and labs.   Partially dictated using [place], any errors are not intentional.  Glean Hess, MD [place], [place]

## 2022-04-16 NOTE — Patient Instructions (Addendum)
EC aspirin 81 mg per day  Nurtec 75 mg - take one a day as needed for migraine

## 2022-04-16 NOTE — Assessment & Plan Note (Signed)
Still having left sided HAs Sleep works the best - did not respond to Imitrex or Tramadol Will try samples of Nurtec Refer to Dr. Melrose Nakayama

## 2022-04-17 DIAGNOSIS — G459 Transient cerebral ischemic attack, unspecified: Secondary | ICD-10-CM | POA: Insufficient documentation

## 2022-04-17 NOTE — Assessment & Plan Note (Signed)
She did not start ASA or statin due to uncertainty about the diagnosis She likely had a TIA and we will optimize prevention  She will begin ECASA 81 mg daily and Crestor 5 mg daily

## 2022-04-18 ENCOUNTER — Other Ambulatory Visit: Payer: Self-pay | Admitting: Internal Medicine

## 2022-04-18 ENCOUNTER — Encounter: Payer: Self-pay | Admitting: Internal Medicine

## 2022-04-18 DIAGNOSIS — G43E19 Chronic migraine with aura, intractable, without status migrainosus: Secondary | ICD-10-CM

## 2022-04-18 MED ORDER — NURTEC 75 MG PO TBDP: 1.0000 | ORAL_TABLET | Freq: Every day | ORAL | 5 refills | Status: DC | PRN

## 2022-05-04 ENCOUNTER — Ambulatory Visit (INDEPENDENT_AMBULATORY_CARE_PROVIDER_SITE_OTHER): Payer: BC Managed Care – PPO | Admitting: Internal Medicine

## 2022-05-04 ENCOUNTER — Encounter: Payer: Self-pay | Admitting: Internal Medicine

## 2022-05-04 VITALS — BP 138/86 | HR 89 | Ht 65.0 in | Wt 208.0 lb

## 2022-05-04 DIAGNOSIS — Z1231 Encounter for screening mammogram for malignant neoplasm of breast: Secondary | ICD-10-CM

## 2022-05-04 DIAGNOSIS — G43E09 Chronic migraine with aura, not intractable, without status migrainosus: Secondary | ICD-10-CM | POA: Insufficient documentation

## 2022-05-04 DIAGNOSIS — I10 Essential (primary) hypertension: Secondary | ICD-10-CM

## 2022-05-04 DIAGNOSIS — Z1211 Encounter for screening for malignant neoplasm of colon: Secondary | ICD-10-CM

## 2022-05-04 DIAGNOSIS — Z0000 Encounter for general adult medical examination without abnormal findings: Secondary | ICD-10-CM

## 2022-05-04 NOTE — Assessment & Plan Note (Signed)
Migraine plus rebound HA Improving with steroid taper Continue Nurtec prn Start Nortriptyline qhs Follow up in one month.  Consider Qulipta for prevention since she responded to Nurtec

## 2022-05-04 NOTE — Progress Notes (Signed)
Date:  05/04/2022   Name:  Jade Harris   DOB:  September 11, 1965   MRN:  [id number]   Chief Complaint: Annual Exam  Jade Harris is a 57 y.o. female who presents today for her Complete Annual Exam. She feels fairly well. She reports exercising some. She reports she is sleeping fairly well. Breast complaints - none.  Mammogram: 11/06/2018- Overdue Pap smear: discontinued Colonoscopy: none  Health Maintenance Due  Topic Date Due   COVID-19 Vaccine (1) Never done   Zoster Vaccines- Shingrix (1 of 2) Never done   COLONOSCOPY (Pts 45-2421yrs Insurance coverage will need to be confirmed)  Never done   MAMMOGRAM  11/06/2019    Immunization History  Administered Date(s) Administered   Influenza,inj,Quad PF,6+ Mos 11/18/2015, 01/01/2017, 11/07/2018   Tdap 06/08/2021    Migraine  This is a recurrent problem. The problem has been gradually improving (since being on prednisone.  Nurtec PRN is helpful). The pain radiates to the face. The pain quality is similar to prior headaches. Pertinent negatives include no abdominal pain, coughing, dizziness, fever, hearing loss, tinnitus or vomiting.   Elevated BP - improving but still high at times.  On maxzide in the past for edema but not taking this now.  Will continue to monitor and begin medications if needed.   Lab Results  Component Value Date   NA 136 04/08/2022   K 3.7 04/08/2022   CO2 23 04/08/2022   GLUCOSE 124 (H) 04/08/2022   BUN 14 04/08/2022   CREATININE 0.80 04/08/2022   CALCIUM 9.1 04/08/2022   EGFR 85 12/05/2020   GFRNONAA >60 04/08/2022   Lab Results  Component Value Date   CHOL 224 (H) 04/09/2022   HDL 75 04/09/2022   LDLCALC 136 (H) 04/09/2022   TRIG 63 04/09/2022   CHOLHDL 3.0 04/09/2022   Lab Results  Component Value Date   TSH 1.070 12/05/2020   Lab Results  Component Value Date   HGBA1C 5.5 04/08/2022   Lab Results  Component Value Date   WBC 8.8 04/08/2022   HGB 12.2 04/08/2022   HCT 38.3  04/08/2022   MCV 89.9 04/08/2022   PLT 263 04/08/2022   Lab Results  Component Value Date   ALT 17 04/08/2022   AST 20 04/08/2022   ALKPHOS 63 04/08/2022   BILITOT 0.6 04/08/2022   Lab Results  Component Value Date   25OHVITD2 <1.0 01/25/2020   25OHVITD3 29 01/25/2020   VD25OH 29.0 (L) 12/05/2020     Review of Systems  Constitutional:  Negative for chills, fatigue and fever.  HENT:  Negative for congestion, hearing loss, tinnitus, trouble swallowing and voice change.   Eyes:  Negative for visual disturbance.  Respiratory:  Negative for cough, chest tightness, shortness of breath and wheezing.   Cardiovascular:  Negative for chest pain, palpitations and leg swelling.  Gastrointestinal:  Negative for abdominal pain, constipation, diarrhea and vomiting.  Endocrine: Negative for polydipsia and polyuria.  Genitourinary:  Negative for dysuria, frequency, genital sores, vaginal bleeding and vaginal discharge.  Musculoskeletal:  Negative for arthralgias, gait problem and joint swelling.  Skin:  Negative for color change and rash.  Neurological:  Positive for headaches. Negative for dizziness, tremors and light-headedness.  Hematological:  Negative for adenopathy. Does not bruise/bleed easily.  Psychiatric/Behavioral:  Negative for dysphoric mood and sleep disturbance. The patient is not nervous/anxious.     Patient Active Problem List   Diagnosis Date Noted   Chronic migraine with aura without status  migrainosus, not intractable 05/04/2022   TIA (transient ischemic attack) 04/17/2022   HTN (hypertension) 04/08/2022   Environmental allergies 06/20/2021   S/P TAH (total abdominal hysterectomy) 12/04/2020   Chronic sacroiliac joint pain (Right) 02/24/2020   Other spondylosis, sacral and sacrococcygeal region 02/24/2020   Enthesopathy of sacroiliac joint (Right) 02/24/2020   Vitamin D insufficiency 02/03/2020   Lumbar facet hypertrophy 02/03/2020   Cervical facet hypertrophy  (Multilevel) (Bilateral) 02/03/2020   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) 02/03/2020   History of marijuana use 02/03/2020   Spondylosis without myelopathy or radiculopathy, lumbosacral region 02/03/2020   Chronic low back pain (1ry area of Pain) (Bilateral) (R>L) w/o sciatica 01/25/2020   Lumbosacral facet syndrome (Bilateral) (R>L) 01/25/2020   Chronic musculoskeletal pain 01/25/2020   Chronic lower extremity pain (2ry area of Pain) (Right) 01/25/2020   Chronic groin pain (Intermittent) (Right) 01/25/2020   Lumbosacral radiculopathy/radiculitis at L2 (Right) 01/25/2020   Chronic neck pain (Bilateral) (L>R) 01/25/2020   Cervicalgia 01/25/2020   Cervicogenic headache (Left) 01/25/2020   Cervico-occipital neuralgia (Left) 01/25/2020   Cervical radiculopathy 01/25/2020   Numbness and tingling of upper extremity (Left) 01/25/2020   DDD (degenerative disc disease), cervical 01/25/2020   DDD (degenerative disc disease), lumbosacral 01/25/2020   Discogenic low back pain 01/25/2020   Chronic pain syndrome 01/24/2020   Disorder of skeletal system 01/24/2020   Slow transit constipation 11/25/2018   Venous insufficiency of both lower extremities 11/25/2018   Eczema 08/23/2017   Headache, chronic daily 09/20/2015   Dyslipidemia 07/04/2014   Menopause 07/04/2014   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   Idiopathic insomnia 07/04/2014    Allergies  Allergen Reactions   Sulfa Antibiotics Other (See Comments)    Past Surgical History:  Procedure Laterality Date   APPENDECTOMY     MOUTH SURGERY     NASAL SINUS SURGERY  04/2014   TONSILLECTOMY     TOTAL ABDOMINAL HYSTERECTOMY W/ BILATERAL SALPINGOOPHORECTOMY     wtih oophorectomy    Social History   Tobacco Use   Smoking status: Former    Types: Cigarettes    Quit date: 1989    Years since quitting: 35.2   Smokeless tobacco: Never  Vaping Use   Vaping Use: Never used  Substance Use Topics    Alcohol use: Yes    Alcohol/week: 0.0 standard drinks of alcohol    Comment: occasional   Drug use: No     Medication list has been reviewed and updated.  Current Meds  Medication Sig   methylPREDNISolone (MEDROL DOSEPAK) 4 MG TBPK tablet Day 1 take 6 pills Day 2 take 5 pills Day 3 take 4 pills Day 4 take 3 pills Day 5 take 2 pills Day 6 take 1 pill Done.       05/04/2022   11:07 AM 04/16/2022    2:04 PM 01/30/2022    4:17 PM 01/17/2022    9:23 AM  GAD 7 : Generalized Anxiety Score  Nervous, Anxious, on Edge 0 0 0 0  Control/stop worrying 0 0 0 0  Worry too much - different things 0 0 0 0  Trouble relaxing 0 0 0 0  Restless 0 0 0 0  Easily annoyed or irritable 0 0 0 0  Afraid - awful might happen 0 0 0 0  Total GAD 7 Score 0 0 0 0  Anxiety Difficulty Not difficult at all Not difficult at all Not difficult at all Not difficult at all  05/04/2022   11:07 AM 04/16/2022    2:04 PM 01/30/2022    4:17 PM  Depression screen PHQ 2/9  Decreased Interest 0 0 0  Down, Depressed, Hopeless 0 0 0  PHQ - 2 Score 0 0 0  Altered sleeping 0 0 0  Tired, decreased energy 0 0 0  Change in appetite 0 0 0  Feeling bad or failure about yourself  0 0 0  Trouble concentrating 0 0 0  Moving slowly or fidgety/restless 0 0 0  Suicidal thoughts 0 0 0  PHQ-9 Score 0 0 0  Difficult doing work/chores Not difficult at all Not difficult at all Not difficult at all    BP Readings from Last 3 Encounters:  05/04/22 138/86  04/16/22 130/86  04/09/22 (!) 146/75    Physical Exam Vitals and nursing note reviewed.  Constitutional:      General: She is not in acute distress.    Appearance: She is well-developed.  HENT:     Head: Normocephalic and atraumatic.     Right Ear: Tympanic membrane and ear canal normal.     Left Ear: Tympanic membrane and ear canal normal.     Nose:     Right Sinus: No maxillary sinus tenderness.     Left Sinus: No maxillary sinus tenderness.  Eyes:     General: No  scleral icterus.       Right eye: No discharge.        Left eye: No discharge.     Conjunctiva/sclera: Conjunctivae normal.  Neck:     Thyroid: No thyromegaly.     Vascular: No carotid bruit.  Cardiovascular:     Rate and Rhythm: Normal rate and regular rhythm.     Pulses: Normal pulses.     Heart sounds: Normal heart sounds.  Pulmonary:     Effort: Pulmonary effort is normal. No respiratory distress.     Breath sounds: No wheezing.  Chest:  Breasts:    Right: No mass, nipple discharge, skin change or tenderness.     Left: No mass, nipple discharge, skin change or tenderness.  Abdominal:     General: Bowel sounds are normal.     Palpations: Abdomen is soft.     Tenderness: There is no abdominal tenderness.  Musculoskeletal:     Cervical back: Normal range of motion. No erythema.     Right lower leg: No edema.     Left lower leg: No edema.  Lymphadenopathy:     Cervical: No cervical adenopathy.  Skin:    General: Skin is warm and dry.     Findings: No rash.  Neurological:     Mental Status: She is alert and oriented to person, place, and time.     Cranial Nerves: No cranial nerve deficit.     Sensory: No sensory deficit.     Deep Tendon Reflexes: Reflexes are normal and symmetric.  Psychiatric:        Attention and Perception: Attention normal.        Mood and Affect: Mood normal.     Wt Readings from Last 3 Encounters:  05/04/22 208 lb (94.3 kg)  04/16/22 211 lb 9.6 oz (96 kg)  04/08/22 220 lb 14.4 oz (100.2 kg)    BP 138/86   Pulse 89   Ht 5\' 5"  (1.651 m)   Wt 208 lb (94.3 kg)   SpO2 98%   BMI 34.61 kg/m   Assessment and Plan:  Problem List Items Addressed This  Visit       Cardiovascular and Mediastinum   Chronic migraine with aura without status migrainosus, not intractable    Migraine plus rebound HA Improving with steroid taper Continue Nurtec prn Start Nortriptyline qhs Follow up in one month.  Consider Qulipta for prevention since she responded  to Nurtec      Relevant Medications   methylPREDNISolone (MEDROL DOSEPAK) 4 MG TBPK tablet   HTN (hypertension)    Clinically stable exam with fairly well controlled BP on maxzide prn. Tolerating medications without side effects. Pt to continue current regimen and low sodium diet.       Other Visit Diagnoses     Annual physical exam    -  Primary   Encounter for screening mammogram for breast cancer       schedule at [facility]   Colon cancer screening       Relevant Orders   Cologuard       Return in about 1 month (around 06/03/2022) for HTN, HA.   Partially dictated using Dragon software, any errors are not intentional.  Reubin Milan, MD [facility] [place], [place]

## 2022-05-04 NOTE — Assessment & Plan Note (Signed)
Clinically stable exam with fairly well controlled BP on maxzide prn. Tolerating medications without side effects. Pt to continue current regimen and low sodium diet.

## 2022-05-04 NOTE — Patient Instructions (Signed)
Call [facility] to schedule your mammogram at [phone].  

## 2022-05-08 NOTE — Telephone Encounter (Signed)
Please review.  KP

## 2022-05-18 ENCOUNTER — Other Ambulatory Visit: Payer: Self-pay | Admitting: Internal Medicine

## 2022-05-18 DIAGNOSIS — G43E09 Chronic migraine with aura, not intractable, without status migrainosus: Secondary | ICD-10-CM

## 2022-05-18 MED ORDER — QULIPTA 60 MG PO TABS: 60.0000 mg | ORAL_TABLET | Freq: Every day | ORAL | 0 refills | Status: DC

## 2022-05-24 ENCOUNTER — Telehealth: Payer: Self-pay

## 2022-05-24 NOTE — Telephone Encounter (Signed)
Completed PA on [email or web address] for Jade Harris  ([employer]) PA Case ID #: [id number] Rx #: [id number]  Awaiting outcome.

## 2022-05-25 NOTE — Telephone Encounter (Signed)
PA approved until 05/24/2023. Will inform patient - Jade Harris

## 2022-05-27 ENCOUNTER — Other Ambulatory Visit: Payer: Self-pay | Admitting: Internal Medicine

## 2022-05-27 DIAGNOSIS — M5030 Other cervical disc degeneration, unspecified cervical region: Secondary | ICD-10-CM

## 2022-05-27 DIAGNOSIS — R0602 Shortness of breath: Secondary | ICD-10-CM

## 2022-06-05 ENCOUNTER — Encounter: Payer: Self-pay | Admitting: Internal Medicine

## 2022-06-05 ENCOUNTER — Ambulatory Visit: Payer: BC Managed Care – PPO | Admitting: Internal Medicine

## 2022-06-05 VITALS — BP 140/92 | HR 82 | Ht 65.0 in | Wt 210.0 lb

## 2022-06-05 DIAGNOSIS — G43E09 Chronic migraine with aura, not intractable, without status migrainosus: Secondary | ICD-10-CM

## 2022-06-05 DIAGNOSIS — I10 Essential (primary) hypertension: Secondary | ICD-10-CM | POA: Diagnosis not present

## 2022-06-05 MED ORDER — NURTEC 75 MG PO TBDP: 1.0000 | ORAL_TABLET | Freq: Every day | ORAL | 5 refills | Status: AC | PRN

## 2022-06-05 MED ORDER — NORTRIPTYLINE HCL 10 MG PO CAPS: 20.0000 mg | ORAL_CAPSULE | Freq: Every day | ORAL | 1 refills | Status: DC

## 2022-06-05 MED ORDER — OLMESARTAN MEDOXOMIL 20 MG PO TABS: 20.0000 mg | ORAL_TABLET | Freq: Every day | ORAL | 1 refills | Status: DC

## 2022-06-05 NOTE — Assessment & Plan Note (Signed)
BP has been consistently elevated in the 140/90 range or slightly higher. Continue to avoid caffeine. Will begin Olmesartan 20 mg daily and recheck 6 weeks.

## 2022-06-05 NOTE — Progress Notes (Signed)
Date:  06/05/2022   Name:  Jade Harris   DOB:  1965-11-29   MRN:  [id number]   Chief Complaint: Hypertension  Hypertension This is a new problem. The current episode started more than 1 month ago. The problem is uncontrolled. Associated symptoms include headaches (migraines). Pertinent negatives include no chest pain, palpitations or shortness of breath. There are no associated agents (she has cut back on caffeine) to hypertension. Past treatments include nothing.  Migraine  This is a chronic problem. The problem has been gradually improving. The pain radiates to the face. The pain quality is similar to prior headaches. Associated symptoms include back pain. Pertinent negatives include no abdominal pain, coughing, dizziness or weakness. Treatments tried: now on Pamelor 20 mg qhs, qulipta 60 mg daily. The treatment provided moderate relief. Her past medical history is significant for hypertension.    Lab Results  Component Value Date   NA 136 04/08/2022   K 3.7 04/08/2022   CO2 23 04/08/2022   GLUCOSE 124 (H) 04/08/2022   BUN 14 04/08/2022   CREATININE 0.80 04/08/2022   CALCIUM 9.1 04/08/2022   EGFR 85 12/05/2020   GFRNONAA >60 04/08/2022   Lab Results  Component Value Date   CHOL 224 (H) 04/09/2022   HDL 75 04/09/2022   LDLCALC 136 (H) 04/09/2022   TRIG 63 04/09/2022   CHOLHDL 3.0 04/09/2022   Lab Results  Component Value Date   TSH 1.070 12/05/2020   Lab Results  Component Value Date   HGBA1C 5.5 04/08/2022   Lab Results  Component Value Date   WBC 8.8 04/08/2022   HGB 12.2 04/08/2022   HCT 38.3 04/08/2022   MCV 89.9 04/08/2022   PLT 263 04/08/2022   Lab Results  Component Value Date   ALT 17 04/08/2022   AST 20 04/08/2022   ALKPHOS 63 04/08/2022   BILITOT 0.6 04/08/2022   Lab Results  Component Value Date   25OHVITD2 <1.0 01/25/2020   25OHVITD3 29 01/25/2020   VD25OH 29.0 (L) 12/05/2020     Review of Systems  Constitutional:  Negative for  fatigue and unexpected weight change.  HENT:  Negative for nosebleeds.   Eyes:  Negative for visual disturbance.  Respiratory:  Negative for cough, chest tightness, shortness of breath and wheezing.   Cardiovascular:  Negative for chest pain, palpitations and leg swelling.  Gastrointestinal:  Negative for abdominal pain, constipation and diarrhea.  Musculoskeletal:  Positive for back pain.  Neurological:  Positive for headaches (migraines). Negative for dizziness, weakness and light-headedness.  Psychiatric/Behavioral:  Negative for dysphoric mood and sleep disturbance. The patient is not nervous/anxious.     Patient Active Problem List   Diagnosis Date Noted   Primary hypertension 06/05/2022   Chronic migraine with aura without status migrainosus, not intractable 05/04/2022   TIA (transient ischemic attack) 04/17/2022   Environmental allergies 06/20/2021   S/P TAH (total abdominal hysterectomy) 12/04/2020   Chronic sacroiliac joint pain (Right) 02/24/2020   Other spondylosis, sacral and sacrococcygeal region 02/24/2020   Enthesopathy of sacroiliac joint (Right) 02/24/2020   Vitamin D insufficiency 02/03/2020   Lumbar facet hypertrophy 02/03/2020   Cervical facet hypertrophy (Multilevel) (Bilateral) 02/03/2020   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) 02/03/2020   History of marijuana use 02/03/2020   Spondylosis without myelopathy or radiculopathy, lumbosacral region 02/03/2020   Chronic low back pain (1ry area of Pain) (Bilateral) (R>L) w/o sciatica 01/25/2020   Lumbosacral facet syndrome (Bilateral) (R>L) 01/25/2020   Chronic musculoskeletal  pain 01/25/2020   Chronic lower extremity pain (2ry area of Pain) (Right) 01/25/2020   Chronic groin pain (Intermittent) (Right) 01/25/2020   Lumbosacral radiculopathy/radiculitis at L2 (Right) 01/25/2020   Chronic neck pain (Bilateral) (L>R) 01/25/2020   Cervicalgia 01/25/2020   Cervicogenic headache (Left) 01/25/2020    Cervico-occipital neuralgia (Left) 01/25/2020   Cervical radiculopathy 01/25/2020   Numbness and tingling of upper extremity (Left) 01/25/2020   DDD (degenerative disc disease), cervical 01/25/2020   DDD (degenerative disc disease), lumbosacral 01/25/2020   Discogenic low back pain 01/25/2020   Chronic pain syndrome 01/24/2020   Disorder of skeletal system 01/24/2020   Slow transit constipation 11/25/2018   Venous insufficiency of both lower extremities 11/25/2018   Eczema 08/23/2017   Headache, chronic daily 09/20/2015   Dyslipidemia 07/04/2014   Menopause 07/04/2014   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   Idiopathic insomnia 07/04/2014    Allergies  Allergen Reactions   Sulfa Antibiotics Other (See Comments)    Past Surgical History:  Procedure Laterality Date   APPENDECTOMY     MOUTH SURGERY     NASAL SINUS SURGERY  04/2014   TONSILLECTOMY     TOTAL ABDOMINAL HYSTERECTOMY W/ BILATERAL SALPINGOOPHORECTOMY     wtih oophorectomy    Social History   Tobacco Use   Smoking status: Former    Types: Cigarettes    Quit date: 1989    Years since quitting: 35.3   Smokeless tobacco: Never  Vaping Use   Vaping Use: Never used  Substance Use Topics   Alcohol use: Yes    Alcohol/week: 0.0 standard drinks of alcohol    Comment: occasional   Drug use: No     Medication list has been reviewed and updated.  Current Meds  Medication Sig   acetaminophen (TYLENOL) 500 MG tablet Take 500 mg by mouth every 6 (six) hours as needed.   albuterol (VENTOLIN HFA) 108 (90 Base) MCG/ACT inhaler Inhale 2 puffs into the lungs every 6 (six) hours as needed for wheezing or shortness of breath.   Atogepant (QULIPTA) 60 MG TABS Take 1 tablet (60 mg total) by mouth daily at 6 (six) AM.   B Complex Vitamins (B COMPLEX PO) Take 1 tablet by mouth daily.   Calcipotriene-Betameth Diprop 0.005-0.064 % FOAM Apply topically as needed. Eczema   diphenhydrAMINE (BENADRYL) 25 MG tablet  Take 25 mg by mouth every 6 (six) hours as needed.   EPINEPHrine (EPIPEN 2-PAK) 0.3 mg/0.3 mL IJ SOAJ injection Inject 0.3 mg into the muscle as needed for anaphylaxis.   ibuprofen (ADVIL) 400 MG tablet Take 400 mg by mouth every 6 (six) hours as needed.   Naproxen Sodium (ALEVE PO) Take by mouth daily.   olmesartan (BENICAR) 20 MG tablet Take 1 tablet (20 mg total) by mouth daily.   rosuvastatin (CRESTOR) 5 MG tablet Take 1 tablet (5 mg total) by mouth daily.   [DISCONTINUED] nortriptyline (PAMELOR) 10 MG capsule Take 20 mg by mouth at bedtime.   [DISCONTINUED] Rimegepant Sulfate (NURTEC) 75 MG TBDP Take 1 tablet (75 mg total) by mouth daily as needed.   [DISCONTINUED] triamterene-hydrochlorothiazide (MAXZIDE-25) 37.5-25 MG tablet Take 1.5 tablets by mouth as needed.       06/05/2022   10:54 AM 05/04/2022   11:07 AM 04/16/2022    2:04 PM 01/30/2022    4:17 PM  GAD 7 : Generalized Anxiety Score  Nervous, Anxious, on Edge 0 0 0 0  Control/stop worrying 0 0 0 0  Worry too  much - different things 0 0 0 0  Trouble relaxing 0 0 0 0  Restless 0 0 0 0  Easily annoyed or irritable 0 0 0 0  Afraid - awful might happen 0 0 0 0  Total GAD 7 Score 0 0 0 0  Anxiety Difficulty Not difficult at all Not difficult at all Not difficult at all Not difficult at all       06/05/2022   10:54 AM 05/04/2022   11:07 AM 04/16/2022    2:04 PM  Depression screen PHQ 2/9  Decreased Interest 0 0 0  Down, Depressed, Hopeless 0 0 0  PHQ - 2 Score 0 0 0  Altered sleeping 0 0 0  Tired, decreased energy 0 0 0  Change in appetite 0 0 0  Feeling bad or failure about yourself  0 0 0  Trouble concentrating 0 0 0  Moving slowly or fidgety/restless 0 0 0  Suicidal thoughts 0 0 0  PHQ-9 Score 0 0 0  Difficult doing work/chores Not difficult at all Not difficult at all Not difficult at all    BP Readings from Last 3 Encounters:  06/05/22 (!) 140/92  05/04/22 138/86  04/16/22 130/86    Physical Exam Vitals and  nursing note reviewed.  Constitutional:      General: She is not in acute distress.    Appearance: Normal appearance. She is well-developed.  HENT:     Head: Normocephalic and atraumatic.  Cardiovascular:     Rate and Rhythm: Normal rate and regular rhythm.     Heart sounds: No murmur heard. Pulmonary:     Effort: Pulmonary effort is normal. No respiratory distress.     Breath sounds: No wheezing or rhonchi.  Musculoskeletal:     Cervical back: Normal range of motion.     Right lower leg: No edema.     Left lower leg: No edema.  Lymphadenopathy:     Cervical: No cervical adenopathy.  Skin:    General: Skin is warm and dry.     Findings: No rash.  Neurological:     General: No focal deficit present.     Mental Status: She is alert and oriented to person, place, and time.  Psychiatric:        Mood and Affect: Mood normal.        Behavior: Behavior normal.     Wt Readings from Last 3 Encounters:  06/05/22 210 lb (95.3 kg)  05/04/22 208 lb (94.3 kg)  04/16/22 211 lb 9.6 oz (96 kg)    BP (!) 140/92 (BP Location: Left Arm, Cuff Size: Large)   Pulse 82   Ht 5\' 5"  (1.651 m)   Wt 210 lb (95.3 kg)   SpO2 98%   BMI 34.95 kg/m   Assessment and Plan:  Problem List Items Addressed This Visit       Cardiovascular and Mediastinum   Chronic migraine with aura without status migrainosus, not intractable    Migraines are improving since adding Qulipta. Will continue Pamelor 20 mg at HS. Nurtec PRN.      Relevant Medications   olmesartan (BENICAR) 20 MG tablet   Rimegepant Sulfate (NURTEC) 75 MG TBDP   nortriptyline (PAMELOR) 10 MG capsule   Primary hypertension - Primary (Chronic)    BP has been consistently elevated in the 140/90 range or slightly higher. Continue to avoid caffeine. Will begin Olmesartan 20 mg daily and recheck 6 weeks.      Relevant Medications  olmesartan (BENICAR) 20 MG tablet    Return in about 6 weeks (around 07/17/2022) for HTN, migraine.    Partially dictated using Dragon software, any errors are not intentional.  Reubin Milan, MD [facility] [place], [place]

## 2022-06-05 NOTE — Assessment & Plan Note (Signed)
Migraines are improving since adding Qulipta. Will continue Pamelor 20 mg at HS. Nurtec PRN.

## 2022-06-08 ENCOUNTER — Other Ambulatory Visit: Payer: Self-pay | Admitting: Internal Medicine

## 2022-06-08 DIAGNOSIS — G43E09 Chronic migraine with aura, not intractable, without status migrainosus: Secondary | ICD-10-CM

## 2022-06-08 NOTE — Telephone Encounter (Signed)
Requested medication (s) are due for refill today: yes  Requested medication (s) are on the active medication list: yes  Last refill:  05/18/22  Future visit scheduled: yes  Notes to clinic:   Medication not assigned to a protocol, review manually.      Requested Prescriptions  Pending Prescriptions Disp Refills   QULIPTA 60 MG TABS [Pharmacy Med Name: QULIPTA 60MG  TABLETS] 30 tablet 0    Sig: TAKE 1 TABLET(60 MG) BY MOUTH DAILY AT 6 AM     Off-Protocol Failed - 06/08/2022  4:13 PM      Failed - Medication not assigned to a protocol, review manually.      Passed - Valid encounter within last 12 months    Recent Outpatient Visits           3 days ago Primary hypertension   [place] [facility] at [facility], Nyoka Cowden, MD   1 month ago Annual physical exam   [facility] at [facility], Nyoka Cowden, MD   1 month ago TIA (transient ischemic attack)   [facility] at [facility], Nyoka Cowden, MD   4 months ago UTI symptoms   [facility] at [facility], Nyoka Cowden, MD   4 months ago Influenza A   [facility] at [facility], Nyoka Cowden, MD       Future Appointments             In 1 month Judithann Graves, Nyoka Cowden, MD [facility] at [facility], [facility]

## 2022-06-26 ENCOUNTER — Ambulatory Visit: Payer: BC Managed Care – PPO | Admitting: Internal Medicine

## 2022-06-26 ENCOUNTER — Encounter: Payer: Self-pay | Admitting: Internal Medicine

## 2022-06-26 VITALS — BP 100/60 | HR 104 | Temp 98.5°F | Ht 65.0 in | Wt 208.0 lb

## 2022-06-26 DIAGNOSIS — J029 Acute pharyngitis, unspecified: Secondary | ICD-10-CM | POA: Diagnosis not present

## 2022-06-26 MED ORDER — AZITHROMYCIN 250 MG PO TABS: ORAL_TABLET | ORAL | 0 refills | Status: AC

## 2022-06-26 NOTE — Patient Instructions (Signed)
Take an antihistamine like Benadryl or Claritin daily  Push fluids, take tylenol or Advil regularly for comfort  Use antiseptic throat lozenges to numb

## 2022-06-26 NOTE — Progress Notes (Signed)
Date:  06/26/2022   Name:  Jade Harris   DOB:  [birth year]   MRN:  [id number]   Chief Complaint: Sore Throat (Sore throat and ear pain. Hurts to swallow. )  Sore Throat  This is a new problem. The current episode started yesterday. The problem has been unchanged. Neither side of throat is experiencing more pain than the other. There has been no fever. The pain is moderate. Associated symptoms include congestion, ear pain, headaches, neck pain, swollen glands and trouble swallowing. Pertinent negatives include no coughing, ear discharge, hoarse voice or shortness of breath. She has tried NSAIDs for the symptoms. The treatment provided mild relief.    Lab Results  Component Value Date   NA 136 04/08/2022   K 3.7 04/08/2022   CO2 23 04/08/2022   GLUCOSE 124 (H) 04/08/2022   BUN 14 04/08/2022   CREATININE 0.80 04/08/2022   CALCIUM 9.1 04/08/2022   EGFR 85 12/05/2020   GFRNONAA >60 04/08/2022   Lab Results  Component Value Date   CHOL 224 (H) 04/09/2022   HDL 75 04/09/2022   LDLCALC 136 (H) 04/09/2022   TRIG 63 04/09/2022   CHOLHDL 3.0 04/09/2022   Lab Results  Component Value Date   TSH 1.070 12/05/2020   Lab Results  Component Value Date   HGBA1C 5.5 04/08/2022   Lab Results  Component Value Date   WBC 8.8 04/08/2022   HGB 12.2 04/08/2022   HCT 38.3 04/08/2022   MCV 89.9 04/08/2022   PLT 263 04/08/2022   Lab Results  Component Value Date   ALT 17 04/08/2022   AST 20 04/08/2022   ALKPHOS 63 04/08/2022   BILITOT 0.6 04/08/2022   Lab Results  Component Value Date   25OHVITD2 <1.0 01/25/2020   25OHVITD3 29 01/25/2020   VD25OH 29.0 (L) 12/05/2020     Review of Systems  Constitutional:  Negative for chills, fatigue and fever.  HENT:  Positive for congestion, ear pain, postnasal drip, sore throat and trouble swallowing. Negative for ear discharge and hoarse voice.   Respiratory:  Negative for cough, chest tightness and shortness of breath.    Cardiovascular:  Negative for chest pain.  Musculoskeletal:  Positive for neck pain.  Neurological:  Positive for headaches. Negative for dizziness.  Psychiatric/Behavioral:  Negative for dysphoric mood and sleep disturbance. The patient is not nervous/anxious.     Patient Active Problem List   Diagnosis Date Noted   Primary hypertension 06/05/2022   Chronic migraine with aura without status migrainosus, not intractable 05/04/2022   TIA (transient ischemic attack) 04/17/2022   Environmental allergies 06/20/2021   S/P TAH (total abdominal hysterectomy) 12/04/2020   Chronic sacroiliac joint pain (Right) 02/24/2020   Other spondylosis, sacral and sacrococcygeal region 02/24/2020   Enthesopathy of sacroiliac joint (Right) 02/24/2020   Vitamin D insufficiency 02/03/2020   Lumbar facet hypertrophy 02/03/2020   Cervical facet hypertrophy (Multilevel) (Bilateral) 02/03/2020   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) 02/03/2020   History of marijuana use 02/03/2020   Spondylosis without myelopathy or radiculopathy, lumbosacral region 02/03/2020   Chronic low back pain (1ry area of Pain) (Bilateral) (R>L) w/o sciatica 01/25/2020   Lumbosacral facet syndrome (Bilateral) (R>L) 01/25/2020   Chronic musculoskeletal pain 01/25/2020   Chronic lower extremity pain (2ry area of Pain) (Right) 01/25/2020   Chronic groin pain (Intermittent) (Right) 01/25/2020   Lumbosacral radiculopathy/radiculitis at L2 (Right) 01/25/2020   Chronic neck pain (Bilateral) (L>R) 01/25/2020   Cervicalgia 01/25/2020  Cervicogenic headache (Left) 01/25/2020   Cervico-occipital neuralgia (Left) 01/25/2020   Cervical radiculopathy 01/25/2020   Numbness and tingling of upper extremity (Left) 01/25/2020   DDD (degenerative disc disease), cervical 01/25/2020   DDD (degenerative disc disease), lumbosacral 01/25/2020   Discogenic low back pain 01/25/2020   Chronic pain syndrome 01/24/2020   Disorder of  skeletal system 01/24/2020   Slow transit constipation 11/25/2018   Venous insufficiency of both lower extremities 11/25/2018   Eczema 08/23/2017   Headache, chronic daily 09/20/2015   Dyslipidemia 07/04/2014   Menopause 07/04/2014   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   Idiopathic insomnia 07/04/2014    Allergies  Allergen Reactions   Sulfa Antibiotics Other (See Comments)    Past Surgical History:  Procedure Laterality Date   APPENDECTOMY     MOUTH SURGERY     NASAL SINUS SURGERY  04/2014   TONSILLECTOMY     TOTAL ABDOMINAL HYSTERECTOMY W/ BILATERAL SALPINGOOPHORECTOMY     wtih oophorectomy    Social History   Tobacco Use   Smoking status: Former    Types: Cigarettes    Quit date: 1989    Years since quitting: 35.4   Smokeless tobacco: Never  Vaping Use   Vaping Use: Never used  Substance Use Topics   Alcohol use: Yes    Alcohol/week: 0.0 standard drinks of alcohol    Comment: occasional   Drug use: No     Medication list has been reviewed and updated.  Current Meds  Medication Sig   acetaminophen (TYLENOL) 500 MG tablet Take 500 mg by mouth every 6 (six) hours as needed.   albuterol (VENTOLIN HFA) 108 (90 Base) MCG/ACT inhaler Inhale 2 puffs into the lungs every 6 (six) hours as needed for wheezing or shortness of breath.   Atogepant (QULIPTA) 60 MG TABS TAKE 1 TABLET(60 MG) BY MOUTH DAILY AT 6 AM   azithromycin (ZITHROMAX Z-PAK) 250 MG tablet UAD   B Complex Vitamins (B COMPLEX PO) Take 1 tablet by mouth daily.   Calcipotriene-Betameth Diprop 0.005-0.064 % FOAM Apply topically as needed. Eczema   diphenhydrAMINE (BENADRYL) 25 MG tablet Take 25 mg by mouth every 6 (six) hours as needed.   DULoxetine (CYMBALTA) 30 MG capsule Take 90 mg by mouth daily.   EPINEPHrine (EPIPEN 2-PAK) 0.3 mg/0.3 mL IJ SOAJ injection Inject 0.3 mg into the muscle as needed for anaphylaxis.   ibuprofen (ADVIL) 400 MG tablet Take 400 mg by mouth every 6 (six) hours as  needed.   Naproxen Sodium (ALEVE PO) Take by mouth daily.   nortriptyline (PAMELOR) 10 MG capsule Take 2 capsules (20 mg total) by mouth at bedtime.   olmesartan (BENICAR) 20 MG tablet Take 1 tablet (20 mg total) by mouth daily.   Rimegepant Sulfate (NURTEC) 75 MG TBDP Take 1 tablet (75 mg total) by mouth daily as needed.   rosuvastatin (CRESTOR) 5 MG tablet Take 1 tablet (5 mg total) by mouth daily.       06/26/2022    3:10 PM 06/05/2022   10:54 AM 05/04/2022   11:07 AM 04/16/2022    2:04 PM  GAD 7 : Generalized Anxiety Score  Nervous, Anxious, on Edge 0 0 0 0  Control/stop worrying 0 0 0 0  Worry too much - different things 0 0 0 0  Trouble relaxing 0 0 0 0  Restless 0 0 0 0  Easily annoyed or irritable 0 0 0 0  Afraid - awful might happen 0 0 0  0  Total GAD 7 Score 0 0 0 0  Anxiety Difficulty Not difficult at all Not difficult at all Not difficult at all Not difficult at all       06/26/2022    3:10 PM 06/05/2022   10:54 AM 05/04/2022   11:07 AM  Depression screen PHQ 2/9  Decreased Interest 0 0 0  Down, Depressed, Hopeless 0 0 0  PHQ - 2 Score 0 0 0  Altered sleeping 0 0 0  Tired, decreased energy 0 0 0  Change in appetite 0 0 0  Feeling bad or failure about yourself  0 0 0  Trouble concentrating 0 0 0  Moving slowly or fidgety/restless 0 0 0  Suicidal thoughts 0 0 0  PHQ-9 Score 0 0 0  Difficult doing work/chores Not difficult at all Not difficult at all Not difficult at all    BP Readings from Last 3 Encounters:  06/26/22 100/60  06/05/22 (!) 140/92  05/04/22 138/86    Physical Exam Vitals and nursing note reviewed.  Constitutional:      General: She is not in acute distress.    Appearance: She is well-developed. She is ill-appearing.  HENT:     Head: Normocephalic and atraumatic.     Right Ear: Tympanic membrane normal. No middle ear effusion. Tympanic membrane is not erythematous.     Left Ear: Tympanic membrane normal.  No middle ear effusion. Tympanic  membrane is not erythematous.     Mouth/Throat:     Mouth: Mucous membranes are moist.     Pharynx: Uvula midline. Pharyngeal swelling present. No oropharyngeal exudate.     Tonsils: No tonsillar exudate.  Eyes:     Conjunctiva/sclera: Conjunctivae normal.  Cardiovascular:     Rate and Rhythm: Normal rate and regular rhythm.  Pulmonary:     Effort: Pulmonary effort is normal. No respiratory distress.     Breath sounds: No wheezing or rhonchi.  Musculoskeletal:     Cervical back: Normal range of motion.  Lymphadenopathy:     Cervical: No cervical adenopathy.  Skin:    General: Skin is warm and dry.     Findings: No rash.  Neurological:     Mental Status: She is alert and oriented to person, place, and time.  Psychiatric:        Mood and Affect: Mood normal.        Behavior: Behavior normal.     Wt Readings from Last 3 Encounters:  06/26/22 208 lb (94.3 kg)  06/05/22 210 lb (95.3 kg)  05/04/22 208 lb (94.3 kg)    BP 100/60   Pulse (!) 104   Temp 98.5 F (36.9 C) (Oral)   Ht 5\' 5"  (1.651 m)   Wt 208 lb (94.3 kg)   SpO2 99%   BMI 34.61 kg/m   Assessment and Plan:  Problem List Items Addressed This Visit   None Visit Diagnoses     Pharyngitis, unspecified etiology    -  Primary   suspect bacterial - treat with Zpak tylenol/advil regularly for comfort Push fluids, use antihistamine to reduce PND   Relevant Medications   azithromycin (ZITHROMAX Z-PAK) 250 MG tablet       No follow-ups on file.   Partially dictated using Dragon software, any errors are not intentional.  Reubin Milan, MD [facility] [place], [place]

## 2022-07-02 ENCOUNTER — Ambulatory Visit
Admission: RE | Admit: 2022-07-02 | Discharge: 2022-07-02 | Disposition: A | Discharge status: Home / Self Care | Payer: BC Managed Care – PPO | Source: Ambulatory Visit | Attending: Internal Medicine | Admitting: Internal Medicine

## 2022-07-02 DIAGNOSIS — Z1231 Encounter for screening mammogram for malignant neoplasm of breast: Secondary | ICD-10-CM | POA: Insufficient documentation

## 2022-07-06 ENCOUNTER — Other Ambulatory Visit: Payer: Self-pay | Admitting: Internal Medicine

## 2022-07-06 DIAGNOSIS — M5030 Other cervical disc degeneration, unspecified cervical region: Secondary | ICD-10-CM

## 2022-07-06 NOTE — Telephone Encounter (Signed)
Unable to refill per protocol, Rx expired. Discontinued 05/04/22.  Requested Prescriptions  Pending Prescriptions Disp Refills   gabapentin (NEURONTIN) 300 MG capsule [Pharmacy Med Name: GABAPENTIN 300MG  CAPSULES] 90 capsule 1    Sig: TAKE 1 CAPSULE(300 MG) BY MOUTH THREE TIMES DAILY     Neurology: Anticonvulsants - gabapentin Passed - 07/06/2022  7:53 AM      Passed - Cr in normal range and within 360 days    Creatinine, Ser  Date Value Ref Range Status  04/08/2022 0.80 0.44 - 1.00 mg/dL Final         Passed - Completed PHQ-2 or PHQ-9 in the last 360 days      Passed - Valid encounter within last 12 months    Recent Outpatient Visits           1 week ago Pharyngitis, unspecified etiology   [place] [facility] at [facility], Nyoka Cowden, MD   1 month ago Primary hypertension   [place] [facility] at [facility], Nyoka Cowden, MD   2 months ago Annual physical exam   [facility] at [facility], Nyoka Cowden, MD   2 months ago TIA (transient ischemic attack)   [facility] at [facility], Nyoka Cowden, MD   5 months ago UTI symptoms   [facility] at [facility], Nyoka Cowden, MD       Future Appointments             In 1 week Judithann Graves, Nyoka Cowden, MD [facility] at [facility], [facility]

## 2022-07-16 ENCOUNTER — Ambulatory Visit: Payer: BC Managed Care – PPO | Admitting: Internal Medicine

## 2022-07-16 ENCOUNTER — Encounter: Payer: Self-pay | Admitting: Internal Medicine

## 2022-08-01 ENCOUNTER — Ambulatory Visit: Payer: BC Managed Care – PPO | Admitting: Internal Medicine

## 2022-08-01 ENCOUNTER — Encounter: Payer: Self-pay | Admitting: Internal Medicine

## 2022-08-01 VITALS — BP 122/78 | HR 85 | Ht 65.0 in | Wt 212.0 lb

## 2022-08-01 DIAGNOSIS — I10 Essential (primary) hypertension: Secondary | ICD-10-CM | POA: Diagnosis not present

## 2022-08-01 DIAGNOSIS — R519 Headache, unspecified: Secondary | ICD-10-CM

## 2022-08-01 DIAGNOSIS — R0982 Postnasal drip: Secondary | ICD-10-CM

## 2022-08-01 NOTE — Assessment & Plan Note (Signed)
Doing much better on Qulipta daily Headaches about once a week and much less severe Will taper off Pamelor

## 2022-08-01 NOTE — Patient Instructions (Addendum)
Reduce pamelor to 1 at bedtime for one week then stop completely.  Take an antihistamine like Claritin or Zyrtec to reduce the post nasal drainage and throat irritation.  Continue Flonase.

## 2022-08-01 NOTE — Progress Notes (Signed)
Date:  08/01/2022   Name:  Jade Harris   DOB:  January 14, 1966   MRN:  [id number]   Chief Complaint: No chief complaint on file.  Hypertension This is a chronic problem. The problem is controlled. Associated symptoms include headaches. Pertinent negatives include no chest pain or shortness of breath. Past treatments include angiotensin blockers. The current treatment provides significant improvement.  Migraine  This is a recurrent problem. Associated symptoms include a sore throat. Pertinent negatives include no dizziness or fever. Her past medical history is significant for hypertension.    Lab Results  Component Value Date   NA 136 04/08/2022   K 3.7 04/08/2022   CO2 23 04/08/2022   GLUCOSE 124 (H) 04/08/2022   BUN 14 04/08/2022   CREATININE 0.80 04/08/2022   CALCIUM 9.1 04/08/2022   EGFR 85 12/05/2020   GFRNONAA >60 04/08/2022   Lab Results  Component Value Date   CHOL 224 (H) 04/09/2022   HDL 75 04/09/2022   LDLCALC 136 (H) 04/09/2022   TRIG 63 04/09/2022   CHOLHDL 3.0 04/09/2022   Lab Results  Component Value Date   TSH 1.070 12/05/2020   Lab Results  Component Value Date   HGBA1C 5.5 04/08/2022   Lab Results  Component Value Date   WBC 8.8 04/08/2022   HGB 12.2 04/08/2022   HCT 38.3 04/08/2022   MCV 89.9 04/08/2022   PLT 263 04/08/2022   Lab Results  Component Value Date   ALT 17 04/08/2022   AST 20 04/08/2022   ALKPHOS 63 04/08/2022   BILITOT 0.6 04/08/2022   Lab Results  Component Value Date   25OHVITD2 <1.0 01/25/2020   25OHVITD3 29 01/25/2020   VD25OH 29.0 (L) 12/05/2020     Review of Systems  Constitutional:  Positive for fatigue. Negative for chills and fever.  HENT:  Positive for sore throat. Negative for trouble swallowing.   Respiratory:  Negative for chest tightness and shortness of breath.   Cardiovascular:  Positive for leg swelling. Negative for chest pain.  Neurological:  Positive for headaches. Negative for dizziness.   Psychiatric/Behavioral:  Positive for dysphoric mood. Negative for sleep disturbance. The patient is not nervous/anxious.     Patient Active Problem List   Diagnosis Date Noted   Primary hypertension 06/05/2022   Chronic migraine with aura without status migrainosus, not intractable 05/04/2022   TIA (transient ischemic attack) 04/17/2022   Environmental allergies 06/20/2021   S/P TAH (total abdominal hysterectomy) 12/04/2020   Chronic sacroiliac joint pain (Right) 02/24/2020   Other spondylosis, sacral and sacrococcygeal region 02/24/2020   Enthesopathy of sacroiliac joint (Right) 02/24/2020   Vitamin D insufficiency 02/03/2020   Lumbar facet hypertrophy 02/03/2020   Cervical facet hypertrophy (Multilevel) (Bilateral) 02/03/2020   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) 02/03/2020   History of marijuana use 02/03/2020   Spondylosis without myelopathy or radiculopathy, lumbosacral region 02/03/2020   Chronic low back pain (1ry area of Pain) (Bilateral) (R>L) w/o sciatica 01/25/2020   Lumbosacral facet syndrome (Bilateral) (R>L) 01/25/2020   Chronic musculoskeletal pain 01/25/2020   Chronic lower extremity pain (2ry area of Pain) (Right) 01/25/2020   Chronic groin pain (Intermittent) (Right) 01/25/2020   Lumbosacral radiculopathy/radiculitis at L2 (Right) 01/25/2020   Chronic neck pain (Bilateral) (L>R) 01/25/2020   Cervicalgia 01/25/2020   Cervicogenic headache (Left) 01/25/2020   Cervico-occipital neuralgia (Left) 01/25/2020   Cervical radiculopathy 01/25/2020   Numbness and tingling of upper extremity (Left) 01/25/2020   DDD (degenerative disc disease),  cervical 01/25/2020   DDD (degenerative disc disease), lumbosacral 01/25/2020   Discogenic low back pain 01/25/2020   Chronic pain syndrome 01/24/2020   Disorder of skeletal system 01/24/2020   Slow transit constipation 11/25/2018   Venous insufficiency of both lower extremities 11/25/2018   Eczema  08/23/2017   Headache, chronic daily 09/20/2015   Dyslipidemia 07/04/2014   Menopause 07/04/2014   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   Idiopathic insomnia 07/04/2014    Allergies  Allergen Reactions   Sulfa Antibiotics Other (See Comments)    Past Surgical History:  Procedure Laterality Date   APPENDECTOMY     MOUTH SURGERY     NASAL SINUS SURGERY  04/2014   TONSILLECTOMY     TOTAL ABDOMINAL HYSTERECTOMY W/ BILATERAL SALPINGOOPHORECTOMY     wtih oophorectomy    Social History   Tobacco Use   Smoking status: Former    Types: Cigarettes    Quit date: 1989    Years since quitting: 35.5   Smokeless tobacco: Never  Vaping Use   Vaping Use: Never used  Substance Use Topics   Alcohol use: Yes    Alcohol/week: 0.0 standard drinks of alcohol    Comment: occasional   Drug use: No     Medication list has been reviewed and updated.  Current Meds  Medication Sig   acetaminophen (TYLENOL) 500 MG tablet Take 500 mg by mouth every 6 (six) hours as needed.   albuterol (VENTOLIN HFA) 108 (90 Base) MCG/ACT inhaler Inhale 2 puffs into the lungs every 6 (six) hours as needed for wheezing or shortness of breath.   Atogepant (QULIPTA) 60 MG TABS TAKE 1 TABLET(60 MG) BY MOUTH DAILY AT 6 AM   B Complex Vitamins (B COMPLEX PO) Take 1 tablet by mouth daily.   Calcipotriene-Betameth Diprop 0.005-0.064 % FOAM Apply topically as needed. Eczema   diphenhydrAMINE (BENADRYL) 25 MG tablet Take 25 mg by mouth every 6 (six) hours as needed.   DULoxetine (CYMBALTA) 30 MG capsule Take 90 mg by mouth daily.   EPINEPHrine (EPIPEN 2-PAK) 0.3 mg/0.3 mL IJ SOAJ injection Inject 0.3 mg into the muscle as needed for anaphylaxis.   ibuprofen (ADVIL) 400 MG tablet Take 400 mg by mouth every 6 (six) hours as needed.   Naproxen Sodium (ALEVE PO) Take by mouth daily.   olmesartan (BENICAR) 20 MG tablet Take 1 tablet (20 mg total) by mouth daily.   Rimegepant Sulfate (NURTEC) 75 MG TBDP Take 1  tablet (75 mg total) by mouth daily as needed.   rosuvastatin (CRESTOR) 5 MG tablet Take 1 tablet (5 mg total) by mouth daily.   [DISCONTINUED] nortriptyline (PAMELOR) 10 MG capsule Take 2 capsules (20 mg total) by mouth at bedtime.       08/01/2022    3:41 PM 06/26/2022    3:10 PM 06/05/2022   10:54 AM 05/04/2022   11:07 AM  GAD 7 : Generalized Anxiety Score  Nervous, Anxious, on Edge 0 0 0 0  Control/stop worrying 0 0 0 0  Worry too much - different things  0 0 0  Trouble relaxing 0 0 0 0  Restless 0 0 0 0  Easily annoyed or irritable 0 0 0 0  Afraid - awful might happen 0 0 0 0  Total GAD 7 Score  0 0 0  Anxiety Difficulty Not difficult at all Not difficult at all Not difficult at all Not difficult at all       08/01/2022  3:41 PM 06/26/2022    3:10 PM 06/05/2022   10:54 AM  Depression screen PHQ 2/9  Decreased Interest 3 0 0  Down, Depressed, Hopeless 2 0 0  PHQ - 2 Score 5 0 0  Altered sleeping 3 0 0  Tired, decreased energy 3 0 0  Change in appetite 3 0 0  Feeling bad or failure about yourself  0 0 0  Trouble concentrating 2 0 0  Moving slowly or fidgety/restless 1 0 0  Suicidal thoughts 0 0 0  PHQ-9 Score 17 0 0  Difficult doing work/chores Very difficult Not difficult at all Not difficult at all    BP Readings from Last 3 Encounters:  08/01/22 122/78  06/26/22 100/60  06/05/22 (!) 140/92    Physical Exam Vitals and nursing note reviewed.  Constitutional:      General: She is not in acute distress.    Appearance: Normal appearance. She is well-developed.  HENT:     Head: Normocephalic and atraumatic.     Nose:     Right Sinus: No maxillary sinus tenderness or frontal sinus tenderness.     Left Sinus: No maxillary sinus tenderness or frontal sinus tenderness.     Mouth/Throat:     Pharynx: Pharyngeal swelling present. No oropharyngeal exudate or posterior oropharyngeal erythema.  Cardiovascular:     Rate and Rhythm: Normal rate and regular rhythm.   Pulmonary:     Effort: Pulmonary effort is normal. No respiratory distress.     Breath sounds: No wheezing or rhonchi.  Musculoskeletal:        General: Swelling present.     Cervical back: Normal range of motion.  Lymphadenopathy:     Cervical: No cervical adenopathy.  Skin:    General: Skin is warm and dry.     Findings: No rash.  Neurological:     Mental Status: She is alert and oriented to person, place, and time.  Psychiatric:        Mood and Affect: Mood normal.        Behavior: Behavior normal.     Wt Readings from Last 3 Encounters:  08/01/22 212 lb (96.2 kg)  06/26/22 208 lb (94.3 kg)  06/05/22 210 lb (95.3 kg)    BP 122/78   Pulse 85   Ht 5\' 5"  (1.651 m)   Wt 212 lb (96.2 kg)   SpO2 97%   BMI 35.28 kg/m   Assessment and Plan:  Problem List Items Addressed This Visit     Primary hypertension - Primary (Chronic)    Stable exam with well controlled BP.  Currently taking olmesartan. Tolerating medications without concerns or side effects. Will continue to recommend low sodium diet and current regimen.       Headache, chronic daily    Doing much better on Qulipta daily Headaches about once a week and much less severe Will taper off Pamelor      Other Visit Diagnoses     PND (post-nasal drip)       continue Flonase and add antihistamine to reduce drainage no evidence of bacterial infection       Return for CPX in March.   Partially dictated using Dragon software, any errors are not intentional.  Reubin Milan, MD [facility] [place], [place]

## 2022-08-01 NOTE — Assessment & Plan Note (Signed)
Stable exam with well controlled BP.  Currently taking olmesartan. Tolerating medications without concerns or side effects. Will continue to recommend low sodium diet and current regimen.  

## 2022-08-03 ENCOUNTER — Other Ambulatory Visit: Payer: Self-pay | Admitting: Internal Medicine

## 2022-08-03 ENCOUNTER — Encounter: Payer: Self-pay | Admitting: Internal Medicine

## 2022-08-03 DIAGNOSIS — J029 Acute pharyngitis, unspecified: Secondary | ICD-10-CM

## 2022-08-03 MED ORDER — AMOXICILLIN 875 MG PO TABS: 875.0000 mg | ORAL_TABLET | Freq: Two times a day (BID) | ORAL | 0 refills | Status: AC

## 2022-08-03 NOTE — Telephone Encounter (Signed)
Pt response.  KP

## 2022-08-09 ENCOUNTER — Other Ambulatory Visit: Payer: Self-pay | Admitting: Internal Medicine

## 2022-08-16 ENCOUNTER — Other Ambulatory Visit: Payer: Self-pay | Admitting: Internal Medicine

## 2022-08-16 DIAGNOSIS — G43E09 Chronic migraine with aura, not intractable, without status migrainosus: Secondary | ICD-10-CM

## 2022-08-17 NOTE — Telephone Encounter (Signed)
Requested medication (s) are due for refill today: Yes  Requested medication (s) are on the active medication list: Yes  Last refill:  06/05/22 #[id number]  Future visit scheduled: Yes  Notes to clinic:  Unable to refill per protocol, medication not assigned to the refill protocol.      Requested Prescriptions  Pending Prescriptions Disp Refills   NURTEC 75 MG TBDP [Pharmacy Med Name: NURTEC 75MG  TBDP] 8 tablet 5    Sig: TAKE 1 TABLET ON OR UNDER THE TONGUE ONCE A DAY AS NEEDED.     Off-Protocol Failed - 08/16/2022  4:12 PM      Failed - Medication not assigned to a protocol, review manually.      Passed - Valid encounter within last 12 months    Recent Outpatient Visits           2 weeks ago Primary hypertension   [place] [facility] at [facility], Nyoka Cowden, MD   1 month ago Pharyngitis, unspecified etiology   [place] [facility] at [facility], Nyoka Cowden, MD   2 months ago Primary hypertension   [place] [facility] at [facility], Nyoka Cowden, MD   3 months ago Annual physical exam   [facility] at [facility], Nyoka Cowden, MD   4 months ago TIA (transient ischemic attack)   [facility] Medicine at [facility], Nyoka Cowden, MD       Future Appointments             In 8 months Judithann Graves, Nyoka Cowden, MD [facility] at [facility], [facility]

## 2022-08-27 ENCOUNTER — Other Ambulatory Visit: Payer: Self-pay | Admitting: Internal Medicine

## 2022-09-21 ENCOUNTER — Telehealth: Payer: BC Managed Care – PPO | Admitting: Nurse Practitioner

## 2022-09-21 DIAGNOSIS — J069 Acute upper respiratory infection, unspecified: Secondary | ICD-10-CM | POA: Diagnosis not present

## 2022-09-21 MED ORDER — BENZONATATE 100 MG PO CAPS: 100.0000 mg | ORAL_CAPSULE | Freq: Three times a day (TID) | ORAL | 0 refills | Status: DC | PRN

## 2022-09-21 MED ORDER — IPRATROPIUM BROMIDE 0.03 % NA SOLN: 2.0000 | Freq: Two times a day (BID) | NASAL | 12 refills | Status: AC

## 2022-09-21 NOTE — Progress Notes (Signed)
E-Visit for Upper Respiratory Infection   We are sorry you are not feeling well.  Here is how we plan to help!  Your symptoms may be from Flu or COVID. We recommend a home COVID test if you are able.   Based on what you have shared with me, it looks like you may have a viral upper respiratory infection.  Upper respiratory infections are caused by a large number of viruses; however, rhinovirus is the most common cause.   Symptoms vary from person to person, with common symptoms including sore throat, cough, fatigue or lack of energy and feeling of general discomfort.  A low-grade fever of up to 100.4 may present, but is often uncommon.  Symptoms vary however, and are closely related to a person's age or underlying illnesses.  The most common symptoms associated with an upper respiratory infection are nasal discharge or congestion, cough, sneezing, headache and pressure in the ears and face.  These symptoms usually persist for about 3 to 10 days, but can last up to 2 weeks.  It is important to know that upper respiratory infections do not cause serious illness or complications in most cases.    Upper respiratory infections can be transmitted from person to person, with the most common method of transmission being a person's hands.  The virus is able to live on the skin and can infect other persons for up to 2 hours after direct contact.  Also, these can be transmitted when someone coughs or sneezes; thus, it is important to cover the mouth to reduce this risk.  To keep the spread of the illness at bay, good hand hygiene is very important.  This is an infection that is most likely caused by a virus. There are no specific treatments other than to help you with the symptoms until the infection runs its course.  We are sorry you are not feeling well.  Here is how we plan to help!   For nasal congestion, you may use an oral decongestants such as Mucinex D or if you have glaucoma or high blood pressure use  plain Mucinex.  Saline nasal spray or nasal drops can help and can safely be used as often as needed for congestion.  For your congestion, I have prescribed Ipratropium Bromide nasal spray 0.03% two sprays in each nostril 2-3 times a day  If you do not have a history of heart disease, hypertension, diabetes or thyroid disease, prostate/bladder issues or glaucoma, you may also use Sudafed to treat nasal congestion.  It is highly recommended that you consult with a pharmacist or your primary care physician to ensure this medication is safe for you to take.     If you have a cough, you may use cough suppressants such as Delsym and Robitussin.  If you have glaucoma or high blood pressure, you can also use Coricidin HBP.   For cough I have prescribed for you A prescription cough medication called Tessalon Perles 100 mg. You may take 1-2 capsules every 8 hours as needed for cough  If you have a sore or scratchy throat, use a saltwater gargle-  to  teaspoon of salt dissolved in a 4-ounce to 8-ounce glass of warm water.  Gargle the solution for approximately 15-30 seconds and then spit.  It is important not to swallow the solution.  You can also use throat lozenges/cough drops and Chloraseptic spray to help with throat pain or discomfort.  Warm or cold liquids can also be helpful in  relieving throat pain.  For headache, pain or general discomfort, you can use Ibuprofen or Tylenol as directed.   Some authorities believe that zinc sprays or the use of Echinacea may shorten the course of your symptoms.   HOME CARE Only take medications as instructed by your medical team. Be sure to drink plenty of fluids. Water is fine as well as fruit juices, sodas and electrolyte beverages. You may want to stay away from caffeine or alcohol. If you are nauseated, try taking small sips of liquids. How do you know if you are getting enough fluid? Your urine should be a pale yellow or almost colorless. Get rest. Taking a  steamy shower or using a humidifier may help nasal congestion and ease sore throat pain. You can place a towel over your head and breathe in the steam from hot water coming from a faucet. Using a saline nasal spray works much the same way. Cough drops, hard candies and sore throat lozenges may ease your cough. Avoid close contacts especially the very young and the elderly Cover your mouth if you cough or sneeze Always remember to wash your hands.   GET HELP RIGHT AWAY IF: You develop worsening fever. If your symptoms do not improve within 10 days You develop yellow or green discharge from your nose over 3 days. You have coughing fits You develop a severe head ache or visual changes. You develop shortness of breath, difficulty breathing or start having chest pain Your symptoms persist after you have completed your treatment plan  MAKE SURE YOU  Understand these instructions. Will watch your condition. Will get help right away if you are not doing well or get worse.  Thank you for choosing an e-visit.  Your e-visit answers were reviewed by a board certified advanced clinical practitioner to complete your personal care plan. Depending upon the condition, your plan could have included both over the counter or prescription medications.  Please review your pharmacy choice. Make sure the pharmacy is open so you can pick up prescription now. If there is a problem, you may contact your provider through [employer] and have the prescription routed to another pharmacy.  Your safety is important to [place]. If you have drug allergies check your prescription carefully.   For the next 24 hours you can use MyChart to ask questions about today's visit, request a non-urgent call back, or ask for a work or school excuse. You will get an email in the next two days asking about your experience. I hope that your e-visit has been valuable and will speed your recovery.  Meds ordered this encounter   Medications   ipratropium (ATROVENT) 0.03 % nasal spray    Sig: Place 2 sprays into both nostrils every 12 (twelve) hours.    Dispense:  30 mL    Refill:  12   benzonatate (TESSALON) 100 MG capsule    Sig: Take 1 capsule (100 mg total) by mouth 3 (three) times daily as needed.    Dispense:  30 capsule    Refill:  0    I spent approximately 5 minutes reviewing the patient's history, current symptoms and coordinating their care today.

## 2022-10-11 ENCOUNTER — Other Ambulatory Visit: Payer: Self-pay | Admitting: Internal Medicine

## 2022-10-11 DIAGNOSIS — Z9109 Other allergy status, other than to drugs and biological substances: Secondary | ICD-10-CM

## 2022-10-16 ENCOUNTER — Other Ambulatory Visit: Payer: Self-pay | Admitting: Internal Medicine

## 2022-10-16 DIAGNOSIS — G459 Transient cerebral ischemic attack, unspecified: Secondary | ICD-10-CM

## 2022-10-29 ENCOUNTER — Encounter: Payer: Self-pay | Admitting: Internal Medicine

## 2022-10-29 ENCOUNTER — Ambulatory Visit: Payer: BC Managed Care – PPO | Admitting: Internal Medicine

## 2022-10-29 VITALS — BP 128/78 | HR 95 | Ht 65.0 in | Wt 206.2 lb

## 2022-10-29 DIAGNOSIS — G8929 Other chronic pain: Secondary | ICD-10-CM | POA: Insufficient documentation

## 2022-10-29 DIAGNOSIS — G459 Transient cerebral ischemic attack, unspecified: Secondary | ICD-10-CM | POA: Diagnosis not present

## 2022-10-29 DIAGNOSIS — M79672 Pain in left foot: Secondary | ICD-10-CM | POA: Diagnosis not present

## 2022-10-29 DIAGNOSIS — E785 Hyperlipidemia, unspecified: Secondary | ICD-10-CM

## 2022-10-29 NOTE — Assessment & Plan Note (Signed)
No recurrent symptoms Started on Crestor 20 mg but decr to 5 mg per patient request Also started ASA 81 mg.

## 2022-10-29 NOTE — Progress Notes (Signed)
Date:  10/29/2022   Name:  Jade Harris   DOB:  March 04, 1965   MRN:  [id number]   Chief Complaint: Hyperlipidemia  Hyperlipidemia This is a chronic problem. The problem is controlled. Pertinent negatives include no chest pain or shortness of breath. Current antihyperlipidemic treatment includes statins. The current treatment provides significant improvement of lipids.  Migraine  This is a chronic problem. The problem has been gradually improving. The pain quality is similar to prior headaches. Pertinent negatives include no abdominal pain, coughing, dizziness or weakness.  TIA - no further symptoms, tolerating Crestor well. Also on ASA 81 mg daily.  Lab Results  Component Value Date   NA 136 04/08/2022   K 3.7 04/08/2022   CO2 23 04/08/2022   GLUCOSE 124 (H) 04/08/2022   BUN 14 04/08/2022   CREATININE 0.80 04/08/2022   CALCIUM 9.1 04/08/2022   EGFR 85 12/05/2020   GFRNONAA >60 04/08/2022   Lab Results  Component Value Date   CHOL 224 (H) 04/09/2022   HDL 75 04/09/2022   LDLCALC 136 (H) 04/09/2022   TRIG 63 04/09/2022   CHOLHDL 3.0 04/09/2022   Lab Results  Component Value Date   TSH 1.070 12/05/2020   Lab Results  Component Value Date   HGBA1C 5.5 04/08/2022   Lab Results  Component Value Date   WBC 8.8 04/08/2022   HGB 12.2 04/08/2022   HCT 38.3 04/08/2022   MCV 89.9 04/08/2022   PLT 263 04/08/2022   Lab Results  Component Value Date   ALT 17 04/08/2022   AST 20 04/08/2022   ALKPHOS 63 04/08/2022   BILITOT 0.6 04/08/2022   Lab Results  Component Value Date   25OHVITD2 <1.0 01/25/2020   25OHVITD3 29 01/25/2020   VD25OH 29.0 (L) 12/05/2020     Review of Systems  Constitutional:  Negative for fatigue and unexpected weight change.  HENT:  Negative for nosebleeds and trouble swallowing.   Eyes:  Negative for visual disturbance.  Respiratory:  Negative for cough, chest tightness, shortness of breath and wheezing.   Cardiovascular:  Negative for  chest pain, palpitations and leg swelling.  Gastrointestinal:  Negative for abdominal pain, constipation and diarrhea.  Musculoskeletal:  Positive for arthralgias (left foot pain - mid arch and medial ankle).  Neurological:  Positive for headaches. Negative for dizziness, weakness and light-headedness.  Psychiatric/Behavioral:  Positive for dysphoric mood. Negative for sleep disturbance. The patient is not nervous/anxious.     Patient Active Problem List   Diagnosis Date Noted   Chronic foot pain, left 10/29/2022   Primary hypertension 06/05/2022   Chronic migraine with aura without status migrainosus, not intractable 05/04/2022   TIA (transient ischemic attack) 04/17/2022   Environmental allergies 06/20/2021   S/P TAH (total abdominal hysterectomy) 12/04/2020   Chronic sacroiliac joint pain (Right) 02/24/2020   Other spondylosis, sacral and sacrococcygeal region 02/24/2020   Enthesopathy of sacroiliac joint (Right) 02/24/2020   Vitamin D insufficiency 02/03/2020   Lumbar facet hypertrophy 02/03/2020   Cervical facet hypertrophy (Multilevel) (Bilateral) 02/03/2020   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) 02/03/2020   History of marijuana use 02/03/2020   Spondylosis without myelopathy or radiculopathy, lumbosacral region 02/03/2020   Chronic low back pain (1ry area of Pain) (Bilateral) (R>L) w/o sciatica 01/25/2020   Lumbosacral facet syndrome (Bilateral) (R>L) 01/25/2020   Chronic musculoskeletal pain 01/25/2020   Chronic lower extremity pain (2ry area of Pain) (Right) 01/25/2020   Chronic groin pain (Intermittent) (Right) 01/25/2020  Lumbosacral radiculopathy/radiculitis at L2 (Right) 01/25/2020   Chronic neck pain (Bilateral) (L>R) 01/25/2020   Cervicalgia 01/25/2020   Cervicogenic headache (Left) 01/25/2020   Cervico-occipital neuralgia (Left) 01/25/2020   Cervical radiculopathy 01/25/2020   Numbness and tingling of upper extremity (Left) 01/25/2020   DDD  (degenerative disc disease), cervical 01/25/2020   DDD (degenerative disc disease), lumbosacral 01/25/2020   Discogenic low back pain 01/25/2020   Chronic pain syndrome 01/24/2020   Disorder of skeletal system 01/24/2020   Slow transit constipation 11/25/2018   Venous insufficiency of both lower extremities 11/25/2018   Eczema 08/23/2017   Headache, chronic daily 09/20/2015   Dyslipidemia 07/04/2014   Menopause 07/04/2014   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   Idiopathic insomnia 07/04/2014    Allergies  Allergen Reactions   Sulfa Antibiotics Other (See Comments)    Past Surgical History:  Procedure Laterality Date   APPENDECTOMY     MOUTH SURGERY     NASAL SINUS SURGERY  04/2014   TONSILLECTOMY     TOTAL ABDOMINAL HYSTERECTOMY W/ BILATERAL SALPINGOOPHORECTOMY     wtih oophorectomy    Social History   Tobacco Use   Smoking status: Former    Current packs/day: 0.00    Types: Cigarettes    Quit date: 1989    Years since quitting: 35.7   Smokeless tobacco: Never  Vaping Use   Vaping status: Never Used  Substance Use Topics   Alcohol use: Yes    Alcohol/week: 0.0 standard drinks of alcohol    Comment: occasional   Drug use: No     Medication list has been reviewed and updated.  Current Meds  Medication Sig   acetaminophen (TYLENOL) 500 MG tablet Take 500 mg by mouth every 6 (six) hours as needed.   albuterol (VENTOLIN HFA) 108 (90 Base) MCG/ACT inhaler INHALE 2 PUFFS INTO THE LUNGS EVERY 6 HOURS AS NEEDED FOR WHEEZING OR SHORTNESS OF BREATH   Atogepant (QULIPTA) 60 MG TABS TAKE 1 TABLET(60 MG) BY MOUTH DAILY AT 6 AM   B Complex Vitamins (B COMPLEX PO) Take 1 tablet by mouth daily.   Calcipotriene-Betameth Diprop 0.005-0.064 % FOAM Apply topically as needed. Eczema   diphenhydrAMINE (BENADRYL) 25 MG tablet Take 25 mg by mouth every 6 (six) hours as needed.   DULoxetine (CYMBALTA) 30 MG capsule TAKE 3 CAPSULES(90 MG) BY MOUTH DAILY   EPINEPHrine  (EPIPEN 2-PAK) 0.3 mg/0.3 mL IJ SOAJ injection Inject 0.3 mg into the muscle as needed for anaphylaxis.   fluticasone (FLONASE) 50 MCG/ACT nasal spray SHAKE LIQUID AND USE 2 SPRAYS IN EACH NOSTRIL DAILY   ibuprofen (ADVIL) 400 MG tablet Take 400 mg by mouth every 6 (six) hours as needed.   ipratropium (ATROVENT) 0.03 % nasal spray Place 2 sprays into both nostrils every 12 (twelve) hours.   Naproxen Sodium (ALEVE PO) Take by mouth daily.   olmesartan (BENICAR) 20 MG tablet Take 1 tablet (20 mg total) by mouth daily.   Rimegepant Sulfate (NURTEC) 75 MG TBDP Take 1 tablet (75 mg total) by mouth daily as needed.   rosuvastatin (CRESTOR) 5 MG tablet TAKE 1 TABLET(5 MG) BY MOUTH DAILY   [DISCONTINUED] benzonatate (TESSALON) 100 MG capsule Take 1 capsule (100 mg total) by mouth 3 (three) times daily as needed.       10/29/2022    8:48 AM 08/01/2022    3:41 PM 06/26/2022    3:10 PM 06/05/2022   10:54 AM  GAD 7 : Generalized Anxiety Score  Nervous, Anxious, on Edge 0 0 0 0  Control/stop worrying 0 0 0 0  Worry too much - different things 0  0 0  Trouble relaxing 0 0 0 0  Restless 0 0 0 0  Easily annoyed or irritable 0 0 0 0  Afraid - awful might happen 0 0 0 0  Total GAD 7 Score 0  0 0  Anxiety Difficulty Not difficult at all Not difficult at all Not difficult at all Not difficult at all       10/29/2022    8:47 AM 08/01/2022    3:41 PM 06/26/2022    3:10 PM  Depression screen PHQ 2/9  Decreased Interest 0 3 0  Down, Depressed, Hopeless 0 2 0  PHQ - 2 Score 0 5 0  Altered sleeping 3 3 0  Tired, decreased energy 0 3 0  Change in appetite 3 3 0  Feeling bad or failure about yourself  0 0 0  Trouble concentrating 0 2 0  Moving slowly or fidgety/restless 0 1 0  Suicidal thoughts 0 0 0  PHQ-9 Score 6 17 0  Difficult doing work/chores Not difficult at all Very difficult Not difficult at all    BP Readings from Last 3 Encounters:  10/29/22 128/78  08/01/22 122/78  06/26/22 100/60     Physical Exam Vitals and nursing note reviewed.  Constitutional:      General: She is not in acute distress.    Appearance: Normal appearance. She is well-developed.  HENT:     Head: Normocephalic and atraumatic.  Neck:     Vascular: No carotid bruit.  Cardiovascular:     Rate and Rhythm: Normal rate and regular rhythm.  Pulmonary:     Effort: Pulmonary effort is normal. No respiratory distress.  Abdominal:     Palpations: Abdomen is soft.     Tenderness: There is no abdominal tenderness.  Musculoskeletal:     Cervical back: Normal range of motion.     Right lower leg: No edema.     Left lower leg: No edema.     Left foot: Normal range of motion. Tenderness (mid arch and medial ankle) present.  Lymphadenopathy:     Cervical: No cervical adenopathy.  Skin:    General: Skin is warm and dry.     Findings: No rash.  Neurological:     Mental Status: She is alert and oriented to person, place, and time.  Psychiatric:        Mood and Affect: Mood normal.        Behavior: Behavior normal.     Wt Readings from Last 3 Encounters:  10/29/22 206 lb 3.2 oz (93.5 kg)  08/01/22 212 lb (96.2 kg)  06/26/22 208 lb (94.3 kg)    BP 128/78   Pulse 95   Ht 5\' 5"  (1.651 m)   Wt 206 lb 3.2 oz (93.5 kg)   SpO2 95%   BMI 34.31 kg/m   Assessment and Plan:  Problem List Items Addressed This Visit       Unprioritized   Chronic foot pain, left    Suspect tendonitis - minimal benefit from ice massage and stretching Recommend Podiatry evaluation - she will call if she decides to proceed      Dyslipidemia (Chronic)    Now on Crestor since March after TIA No side effects noted.      Relevant Orders   Comprehensive metabolic panel   Lipid panel   TIA (transient ischemic  attack) - Primary    No recurrent symptoms Started on Crestor 20 mg but decr to 5 mg per patient request Also started ASA 81 mg.       Return in about 6 months (around 04/28/2023).    Reubin Milan,  MD [facility] [place]

## 2022-10-29 NOTE — Assessment & Plan Note (Signed)
Now on Crestor since March after TIA No side effects noted.

## 2022-10-29 NOTE — Assessment & Plan Note (Signed)
Suspect tendonitis - minimal benefit from ice massage and stretching Recommend Podiatry evaluation - she will call if she decides to proceed

## 2022-10-30 LAB — COMPREHENSIVE METABOLIC PANEL
ALT: 15 [IU]/L (ref 0–32)
AST: 16 [IU]/L (ref 0–40)
Albumin: 4.4 g/dL (ref 3.8–4.9)
Alkaline Phosphatase: 81 [IU]/L (ref 44–121)
BUN/Creatinine Ratio: 12 (ref 9–23)
BUN: 9 mg/dL (ref 6–24)
Bilirubin Total: 0.3 mg/dL (ref 0.0–1.2)
CO2: 24 mmol/L (ref 20–29)
Calcium: 10.1 mg/dL (ref 8.7–10.2)
Chloride: 105 mmol/L (ref 96–106)
Creatinine, Ser: 0.76 mg/dL (ref 0.57–1.00)
Globulin, Total: 2.8 g/dL (ref 1.5–4.5)
Glucose: 91 mg/dL (ref 70–99)
Potassium: 5 mmol/L (ref 3.5–5.2)
Sodium: 143 mmol/L (ref 134–144)
Total Protein: 7.2 g/dL (ref 6.0–8.5)
eGFR: 90 mL/min/{1.73_m2} (ref >59)

## 2022-10-30 LAB — LIPID PANEL
Chol/HDL Ratio: 2.3 {ratio} (ref 0.0–4.4)
Cholesterol, Total: 189 mg/dL (ref 100–199)
HDL: 83 mg/dL (ref >39)
LDL Chol Calc (NIH): 91 mg/dL (ref 0–99)
Triglycerides: 86 mg/dL (ref 0–149)
VLDL Cholesterol Cal: 15 mg/dL (ref 5–40)

## 2022-11-28 ENCOUNTER — Other Ambulatory Visit: Payer: Self-pay | Admitting: Internal Medicine

## 2022-11-28 DIAGNOSIS — I10 Essential (primary) hypertension: Secondary | ICD-10-CM

## 2022-11-28 DIAGNOSIS — G43E09 Chronic migraine with aura, not intractable, without status migrainosus: Secondary | ICD-10-CM

## 2023-01-29 ENCOUNTER — Other Ambulatory Visit: Payer: Self-pay | Admitting: Internal Medicine

## 2023-01-29 ENCOUNTER — Encounter: Payer: Self-pay | Admitting: Internal Medicine

## 2023-01-29 DIAGNOSIS — N3000 Acute cystitis without hematuria: Secondary | ICD-10-CM

## 2023-01-29 MED ORDER — NITROFURANTOIN MONOHYD MACRO 100 MG PO CAPS: 100.0000 mg | ORAL_CAPSULE | Freq: Two times a day (BID) | ORAL | 0 refills | Status: AC

## 2023-02-13 ENCOUNTER — Other Ambulatory Visit: Payer: Self-pay | Admitting: Internal Medicine

## 2023-02-13 DIAGNOSIS — G43E09 Chronic migraine with aura, not intractable, without status migrainosus: Secondary | ICD-10-CM

## 2023-02-14 NOTE — Telephone Encounter (Signed)
Requested medication (s) are due for refill today -yes  Requested medication (s) are on the active medication list -yes  Future visit scheduled -yes  Last refill: 06/11/22 #30 5RF  Notes to clinic: off protocol- provider review   Requested Prescriptions  Pending Prescriptions Disp Refills   QULIPTA 60 MG TABS [Pharmacy Med Name: QULIPTA 60MG  TABLETS] 30 tablet 5    Sig: TAKE 1 TABLET(60 MG) BY MOUTH DAILY AT 6 AM     Off-Protocol Failed - 02/14/2023  9:31 AM      Failed - Medication not assigned to a protocol, review manually.      Passed - Valid encounter within last 12 months    Recent Outpatient Visits           3 months ago TIA (transient ischemic attack)   [place] [facility] at [facility], Nyoka Cowden, MD   6 months ago Primary hypertension   [place] [facility] at [facility], Nyoka Cowden, MD   7 months ago Pharyngitis, unspecified etiology   [place] [facility] at [facility], Nyoka Cowden, MD   8 months ago Primary hypertension   [place] [facility] at [facility], Nyoka Cowden, MD   9 months ago Annual physical exam   [facility] at [facility], Nyoka Cowden, MD       Future Appointments             In 2 months Reubin Milan, MD [facility] at [facility], [facility]            Signed Prescriptions Disp Refills   DULoxetine (CYMBALTA) 30 MG capsule 90 capsule 1    Sig: TAKE 3 CAPSULES(90 MG) BY MOUTH DAILY     Psychiatry: Antidepressants - SNRI - duloxetine Passed - 02/14/2023  9:31 AM      Passed - Cr in normal range and within 360 days    Creatinine, Ser  Date Value Ref Range Status  10/29/2022 0.76 0.57 - 1.00 mg/dL Final         Passed - eGFR is 30 or above and within 360 days    GFR calc Af Amer  Date Value Ref Range Status  01/25/2020 114 >59  mL/min/1.73 Final    Comment:    **In accordance with recommendations from the NKF-ASN Task force,**   [facility] is in the process of updating its eGFR calculation to the   2021 CKD-EPI creatinine equation that estimates kidney function   without a race variable.    GFR, Estimated  Date Value Ref Range Status  04/08/2022 >60 >60 mL/min Final    Comment:    (NOTE) Calculated using the CKD-EPI Creatinine Equation (2021)    eGFR  Date Value Ref Range Status  10/29/2022 90 >59 mL/min/1.73 Final         Passed - Completed PHQ-2 or PHQ-9 in the last 360 days      Passed - Last BP in normal range    BP Readings from Last 1 Encounters:  10/29/22 128/78         Passed - Valid encounter within last 6 months    Recent Outpatient Visits           3 months ago TIA (transient ischemic attack)   [facility] at [facility]  Reubin Milan, MD   6 months ago Primary hypertension   [place] [facility] at [facility], Nyoka Cowden, MD   7 months ago Pharyngitis, unspecified etiology   [place] [facility] at [facility], Nyoka Cowden, MD   8 months ago Primary hypertension   [place] [facility] at [facility], Nyoka Cowden, MD   9 months ago Annual physical exam   [facility] at [facility], Nyoka Cowden, MD       Future Appointments             In 2 months Judithann Graves Nyoka Cowden, MD [facility] Medicine at [facility], [facility]               Requested Prescriptions  Pending Prescriptions Disp Refills   QULIPTA 60 MG TABS [Pharmacy Med Name: QULIPTA 60MG  TABLETS] 30 tablet 5    Sig: TAKE 1 TABLET(60 MG) BY MOUTH DAILY AT 6 AM     Off-Protocol Failed - 02/14/2023  9:31 AM      Failed - Medication not assigned to a protocol, review manually.      Passed - Valid encounter within last 12  months    Recent Outpatient Visits           3 months ago TIA (transient ischemic attack)   [place] [facility] at [facility], Nyoka Cowden, MD   6 months ago Primary hypertension   [place] [facility] at [facility], Nyoka Cowden, MD   7 months ago Pharyngitis, unspecified etiology   [place] [facility] at [facility], Nyoka Cowden, MD   8 months ago Primary hypertension   [place] [facility] at [facility], Nyoka Cowden, MD   9 months ago Annual physical exam   [facility] at [facility], Nyoka Cowden, MD       Future Appointments             In 2 months Reubin Milan, MD [facility] at [facility], [facility]            Signed Prescriptions Disp Refills   DULoxetine (CYMBALTA) 30 MG capsule 90 capsule 1    Sig: TAKE 3 CAPSULES(90 MG) BY MOUTH DAILY     Psychiatry: Antidepressants - SNRI - duloxetine Passed - 02/14/2023  9:31 AM      Passed - Cr in normal range and within 360 days    Creatinine, Ser  Date Value Ref Range Status  10/29/2022 0.76 0.57 - 1.00 mg/dL Final         Passed - eGFR is 30 or above and within 360 days    GFR calc Af Amer  Date Value Ref Range Status  01/25/2020 114 >59 mL/min/1.73 Final    Comment:    **In accordance with recommendations from the NKF-ASN Task force,**   [facility] is in the process of updating its eGFR calculation to the   2021 CKD-EPI creatinine equation that estimates kidney function   without a race variable.    GFR, Estimated  Date Value Ref Range Status  04/08/2022 >60 >60 mL/min Final    Comment:    (NOTE) Calculated using the CKD-EPI Creatinine Equation (2021)  eGFR  Date Value Ref Range Status  10/29/2022 90 >59 mL/min/1.73 Final         Passed - Completed PHQ-2 or PHQ-9 in the last 360 days      Passed -  Last BP in normal range    BP Readings from Last 1 Encounters:  10/29/22 128/78         Passed - Valid encounter within last 6 months    Recent Outpatient Visits           3 months ago TIA (transient ischemic attack)   [facility] at [facility], Nyoka Cowden, MD   6 months ago Primary hypertension   [place] [facility] at [facility], Nyoka Cowden, MD   7 months ago Pharyngitis, unspecified etiology   [place] [facility] at [facility], Nyoka Cowden, MD   8 months ago Primary hypertension   [place] [facility] at [facility], Nyoka Cowden, MD   9 months ago Annual physical exam   [facility] at [facility], Nyoka Cowden, MD       Future Appointments             In 2 months Judithann Graves, Nyoka Cowden, MD [place] [facility] at [facility], [facility]

## 2023-02-14 NOTE — Telephone Encounter (Signed)
Requested Prescriptions  Pending Prescriptions Disp Refills   DULoxetine (CYMBALTA) 30 MG capsule [Pharmacy Med Name: DULOXETINE DR 30MG  CAPSULES] 90 capsule 1    Sig: TAKE 3 CAPSULES(90 MG) BY MOUTH DAILY     Psychiatry: Antidepressants - SNRI - duloxetine Passed - 02/14/2023  9:29 AM      Passed - Cr in normal range and within 360 days    Creatinine, Ser  Date Value Ref Range Status  10/29/2022 0.76 0.57 - 1.00 mg/dL Final         Passed - eGFR is 30 or above and within 360 days    GFR calc Af Amer  Date Value Ref Range Status  01/25/2020 114 >59 mL/min/1.73 Final    Comment:    **In accordance with recommendations from the NKF-ASN Task force,**   [facility] is in the process of updating its eGFR calculation to the   2021 CKD-EPI creatinine equation that estimates kidney function   without a race variable.    GFR, Estimated  Date Value Ref Range Status  04/08/2022 >60 >60 mL/min Final    Comment:    (NOTE) Calculated using the CKD-EPI Creatinine Equation (2021)    eGFR  Date Value Ref Range Status  10/29/2022 90 >59 mL/min/1.73 Final         Passed - Completed PHQ-2 or PHQ-9 in the last 360 days      Passed - Last BP in normal range    BP Readings from Last 1 Encounters:  10/29/22 128/78         Passed - Valid encounter within last 6 months    Recent Outpatient Visits           3 months ago TIA (transient ischemic attack)   [facility] at [facility], Nyoka Cowden, MD   6 months ago Primary hypertension   [place] Primary Care & Sports Medicine at [facility], Nyoka Cowden, MD   7 months ago Pharyngitis, unspecified etiology   [place] Primary Care & Sports Medicine at [facility], Nyoka Cowden, MD   8 months ago Primary hypertension   [place] Primary Care & Sports Medicine at [facility], Nyoka Cowden, MD   9 months ago Annual physical exam   [facility]  at [facility], Nyoka Cowden, MD       Future Appointments             In 2 months Judithann Graves Nyoka Cowden, MD [facility] at [facility] [place], [facility]             QULIPTA 60 MG TABS [Pharmacy Med Name: QULIPTA 60MG  TABLETS] 30 tablet 5    Sig: TAKE 1 TABLET(60 MG) BY MOUTH DAILY AT 6 AM     Off-Protocol Failed - 02/14/2023  9:29 AM      Failed - Medication not assigned to a protocol, review manually.      Passed - Valid encounter within last 12 months    Recent Outpatient Visits           3 months ago TIA (transient ischemic attack)   [facility] Medicine at [facility], Nyoka Cowden, MD   6 months ago Primary hypertension   [place] Primary Care & Sports Medicine at [facility], Nyoka Cowden, MD   7 months ago Pharyngitis, unspecified etiology     Primary Care & Sports Medicine at [facility], Nyoka Cowden, MD   8 months ago Primary hypertension   [place] [facility] & Sports Medicine at [facility], Nyoka Cowden, MD   9 months ago Annual physical exam   [facility] & [facility] Medicine at [facility], Nyoka Cowden, MD       Future Appointments             In 2 months Judithann Graves, Nyoka Cowden, MD [facility] Primary Care & Sports Medicine at [facility], [facility]

## 2023-03-12 ENCOUNTER — Encounter: Payer: Self-pay | Admitting: Physician Assistant

## 2023-03-12 ENCOUNTER — Telehealth: Payer: Self-pay | Admitting: Internal Medicine

## 2023-03-12 ENCOUNTER — Ambulatory Visit: Payer: 59 | Admitting: Physician Assistant

## 2023-03-12 ENCOUNTER — Other Ambulatory Visit: Payer: Self-pay | Admitting: Physician Assistant

## 2023-03-12 VITALS — BP 126/92 | HR 85 | Temp 97.8°F | Ht 65.0 in | Wt 209.0 lb

## 2023-03-12 DIAGNOSIS — R32 Unspecified urinary incontinence: Secondary | ICD-10-CM

## 2023-03-12 DIAGNOSIS — N3001 Acute cystitis with hematuria: Secondary | ICD-10-CM | POA: Diagnosis not present

## 2023-03-12 MED ORDER — NITROFURANTOIN MONOHYD MACRO 100 MG PO CAPS: 100.0000 mg | ORAL_CAPSULE | Freq: Two times a day (BID) | ORAL | 0 refills | Status: AC

## 2023-03-12 NOTE — Telephone Encounter (Signed)
error 

## 2023-03-12 NOTE — Progress Notes (Signed)
 Date:  03/12/2023   Name:  Jade Harris   DOB:  10/21/1965   MRN:  [id number]   Chief Complaint: Urinary Tract Infection  Urinary Tract Infection  This is a new problem. Episode onset: X1 week. The problem has been gradually worsening. The quality of the pain is described as burning. The pain is at a severity of 2/10. The pain is mild. There has been no fever. She is Sexually active. There is A history of pyelonephritis. Associated symptoms include frequency, hematuria and urgency. Treatments tried: azo, cranberry. The treatment provided mild relief.   Miah presents today for evaluation of suspected UTI for the last 1 week symptoms of urinary frequency, urgency, hematuria, urinary incontinence, and some intermittent lower abdominal pain.  Had a recent UTI late December as well treated with Macrobid successfully. No fever or other systemic symptoms.   She mentions the urinary incontinence is alarming, reporting large-volume losses requiring the use of Depends. She mentions she has stress incontinence (small volume leakage when sneezing) at baseline for years, but nothing like this.   Medication list has been reviewed and updated.  Current Meds  Medication Sig   acetaminophen (TYLENOL) 500 MG tablet Take 500 mg by mouth every 6 (six) hours as needed.   albuterol (VENTOLIN HFA) 108 (90 Base) MCG/ACT inhaler INHALE 2 PUFFS INTO THE LUNGS EVERY 6 HOURS AS NEEDED FOR WHEEZING OR SHORTNESS OF BREATH   ASPIRIN LOW DOSE 81 MG tablet Take 81 mg by mouth daily.   B Complex Vitamins (B COMPLEX PO) Take 1 tablet by mouth daily.   Calcipotriene-Betameth Diprop 0.005-0.064 % FOAM Apply topically as needed. Eczema   diphenhydrAMINE (BENADRYL) 25 MG tablet Take 25 mg by mouth every 6 (six) hours as needed.   DULoxetine (CYMBALTA) 30 MG capsule TAKE 3 CAPSULES(90 MG) BY MOUTH DAILY   EPINEPHrine (EPIPEN 2-PAK) 0.3 mg/0.3 mL IJ SOAJ injection Inject 0.3 mg into the muscle as needed for  anaphylaxis.   fluticasone (FLONASE) 50 MCG/ACT nasal spray SHAKE LIQUID AND USE 2 SPRAYS IN EACH NOSTRIL DAILY   ibuprofen (ADVIL) 400 MG tablet Take 400 mg by mouth every 6 (six) hours as needed.   ipratropium (ATROVENT) 0.03 % nasal spray Place 2 sprays into both nostrils every 12 (twelve) hours.   Naproxen Sodium (ALEVE PO) Take by mouth daily.   nitrofurantoin, macrocrystal-monohydrate, (MACROBID) 100 MG capsule Take 1 capsule (100 mg total) by mouth 2 (two) times daily for 5 days.   olmesartan (BENICAR) 20 MG tablet TAKE 1 TABLET(20 MG) BY MOUTH DAILY   QULIPTA 60 MG TABS TAKE 1 TABLET(60 MG) BY MOUTH DAILY AT 6 AM   Rimegepant Sulfate (NURTEC) 75 MG TBDP Take 1 tablet (75 mg total) by mouth daily as needed.     Review of Systems  Genitourinary:  Positive for frequency, hematuria and urgency.    Patient Active Problem List   Diagnosis Date Noted   Chronic foot pain, left 10/29/2022   Primary hypertension 06/05/2022   Chronic migraine with aura without status migrainosus, not intractable 05/04/2022   TIA (transient ischemic attack) 04/17/2022   Environmental allergies 06/20/2021   S/P TAH (total abdominal hysterectomy) 12/04/2020   Chronic sacroiliac joint pain (Right) 02/24/2020   Other spondylosis, sacral and sacrococcygeal region 02/24/2020   Enthesopathy of sacroiliac joint (Right) 02/24/2020   Vitamin D insufficiency 02/03/2020   Lumbar facet hypertrophy 02/03/2020   Cervical facet hypertrophy (Multilevel) (Bilateral) 02/03/2020   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) 02/03/2020   History of marijuana use 02/03/2020   Spondylosis without myelopathy or radiculopathy, lumbosacral region 02/03/2020   Chronic low back pain (1ry area of Pain) (Bilateral) (R>L) w/o sciatica 01/25/2020   Lumbosacral facet syndrome (Bilateral) (R>L) 01/25/2020   Chronic musculoskeletal pain 01/25/2020   Chronic lower extremity pain (2ry area of Pain) (Right) 01/25/2020    Chronic groin pain (Intermittent) (Right) 01/25/2020   Lumbosacral radiculopathy/radiculitis at L2 (Right) 01/25/2020   Chronic neck pain (Bilateral) (L>R) 01/25/2020   Cervicalgia 01/25/2020   Cervicogenic headache (Left) 01/25/2020   Cervico-occipital neuralgia (Left) 01/25/2020   Cervical radiculopathy 01/25/2020   Numbness and tingling of upper extremity (Left) 01/25/2020   DDD (degenerative disc disease), cervical 01/25/2020   DDD (degenerative disc disease), lumbosacral 01/25/2020   Discogenic low back pain 01/25/2020   Chronic pain syndrome 01/24/2020   Disorder of skeletal system 01/24/2020   Slow transit constipation 11/25/2018   Venous insufficiency of both lower extremities 11/25/2018   Eczema 08/23/2017   Headache, chronic daily 09/20/2015   Dyslipidemia 07/04/2014   Menopause 07/04/2014   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   Idiopathic insomnia 07/04/2014    Allergies  Allergen Reactions   Sulfa Antibiotics Other (See Comments)    Immunization History  Administered Date(s) Administered   Influenza,inj,Quad PF,6+ Mos 11/18/2015, 01/01/2017, 11/07/2018   Tdap 06/08/2021    Past Surgical History:  Procedure Laterality Date   APPENDECTOMY     MOUTH SURGERY     NASAL SINUS SURGERY  04/2014   TONSILLECTOMY     TOTAL ABDOMINAL HYSTERECTOMY W/ BILATERAL SALPINGOOPHORECTOMY     wtih oophorectomy    Social History   Tobacco Use   Smoking status: Former    Current packs/day: 0.00    Types: Cigarettes    Quit date: 1989    Years since quitting: 36.1   Smokeless tobacco: Never  Vaping Use   Vaping status: Never Used  Substance Use Topics   Alcohol use: Yes    Alcohol/week: 0.0 standard drinks of alcohol    Comment: occasional   Drug use: No    Family History  Problem Relation Age of Onset   Hypertension Mother    Breast cancer Neg Hx         03/12/2023   10:26 AM 10/29/2022    8:48 AM 08/01/2022    3:41 PM 06/26/2022    3:10 PM  GAD  7 : Generalized Anxiety Score  Nervous, Anxious, on Edge 0 0 0 0  Control/stop worrying 0 0 0 0  Worry too much - different things 0 0  0  Trouble relaxing 0 0 0 0  Restless 0 0 0 0  Easily annoyed or irritable 0 0 0 0  Afraid - awful might happen 0 0 0 0  Total GAD 7 Score 0 0  0  Anxiety Difficulty Not difficult at all Not difficult at all Not difficult at all Not difficult at all       03/12/2023   10:26 AM 10/29/2022    8:47 AM 08/01/2022    3:41 PM  Depression screen PHQ 2/9  Decreased Interest 0 0 3  Down, Depressed, Hopeless 0 0 2  PHQ - 2 Score 0 0 5  Altered sleeping  3 3  Tired, decreased energy  0 3  Change in appetite  3 3  Feeling bad or failure about yourself   0 0  Trouble concentrating  0 2  Moving slowly or fidgety/restless  0 1  Suicidal thoughts  0 0  PHQ-9 Score  6 17  Difficult doing work/chores  Not difficult at all Very difficult    BP Readings from Last 3 Encounters:  03/12/23 (!) 126/92  10/29/22 128/78  08/01/22 122/78    Wt Readings from Last 3 Encounters:  10/29/22 206 lb 3.2 oz (93.5 kg)  08/01/22 212 lb (96.2 kg)  06/26/22 208 lb (94.3 kg)    BP (!) 126/92   Pulse 85   Temp 97.8 F (36.6 C)   Ht 5\' 5"  (1.651 m)   SpO2 95%   BMI 34.31 kg/m   Physical Exam Vitals and nursing note reviewed.  Constitutional:      Appearance: Normal appearance.  Cardiovascular:     Rate and Rhythm: Normal rate and regular rhythm.     Heart sounds: No murmur heard.    No friction rub. No gallop.  Pulmonary:     Effort: Pulmonary effort is normal.     Breath sounds: Normal breath sounds.  Abdominal:     General: There is no distension.     Palpations: Abdomen is soft.     Tenderness: There is abdominal tenderness (mild) in the suprapubic area and left upper quadrant. There is no right CVA tenderness or left CVA tenderness.  Musculoskeletal:        General: Normal range of motion.  Skin:    General: Skin is warm and dry.  Neurological:      Mental Status: She is alert and oriented to person, place, and time.     Gait: Gait is intact.  Psychiatric:        Mood and Affect: Mood and affect normal.     Recent Labs     Component Value Date/Time   NA 143 10/29/2022 0949   K 5.0 10/29/2022 0949   CL 105 10/29/2022 0949   CO2 24 10/29/2022 0949   GLUCOSE 91 10/29/2022 0949   GLUCOSE 124 (H) 04/08/2022 2010   BUN 9 10/29/2022 0949   CREATININE 0.76 10/29/2022 0949   CALCIUM 10.1 10/29/2022 0949   PROT 7.2 10/29/2022 0949   ALBUMIN 4.4 10/29/2022 0949   AST 16 10/29/2022 0949   ALT 15 10/29/2022 0949   ALKPHOS 81 10/29/2022 0949   BILITOT 0.3 10/29/2022 0949   GFRNONAA >60 04/08/2022 2010   GFRAA 114 01/25/2020 1636    Lab Results  Component Value Date   WBC 8.8 04/08/2022   HGB 12.2 04/08/2022   HCT 38.3 04/08/2022   MCV 89.9 04/08/2022   PLT 263 04/08/2022   Lab Results  Component Value Date   HGBA1C 5.5 04/08/2022   Lab Results  Component Value Date   CHOL 189 10/29/2022   HDL 83 10/29/2022   LDLCALC 91 10/29/2022   TRIG 86 10/29/2022   CHOLHDL 2.3 10/29/2022   Lab Results  Component Value Date   TSH 1.070 12/05/2020     Assessment and Plan:  1. Acute cystitis with hematuria (Primary) Dipstick positive for blood but otherwise invalidated by recent use of Azo.  Sending for culture, but will begin Macrobid as below. - Urine Culture - nitrofurantoin, macrocrystal-monohydrate, (MACROBID) 100 MG capsule; Take 1 capsule (100 mg total) by mouth 2 (two) times daily for 5 days.  Dispense: 10 capsule; Refill: 0  2. Urinary incontinence, unspecified type Discussed with patient that while most of her symptoms are common for UTIs, large volume urinary incontinence is not.  Will send her to urology for further  evaluation.   - Ambulatory referral to Urology    Alvester Morin, PA-C, DMSc, Nutritionist [facility] Primary Care and Sports Medicine [facility] [phone]

## 2023-03-14 LAB — URINE CULTURE: Organism ID, Bacteria: NO GROWTH

## 2023-03-18 ENCOUNTER — Encounter: Payer: Self-pay | Admitting: Internal Medicine

## 2023-03-19 ENCOUNTER — Telehealth: Payer: Self-pay

## 2023-03-19 NOTE — Telephone Encounter (Signed)
 Approved today by [facility] NCPDP [id number].  Will inform patient.

## 2023-03-19 NOTE — Telephone Encounter (Signed)
 Completed PA on Qulipita 60 mg on [email or web address].  (KeyMarcial Pacas)  Awaiting outcome. Patient informed.

## 2023-05-07 ENCOUNTER — Encounter: Payer: Self-pay | Admitting: Internal Medicine

## 2023-05-31 ENCOUNTER — Other Ambulatory Visit (HOSPITAL_COMMUNITY): Payer: Self-pay

## 2023-05-31 ENCOUNTER — Telehealth: Payer: Self-pay | Admitting: Pharmacy Technician

## 2023-05-31 NOTE — Telephone Encounter (Signed)
 Pharmacy Patient Advocate Encounter   Received notification from Onbase that prior authorization for QULIPTA  60MG  TABS is required/requested.   Insurance verification completed.   The patient is insured through [facility] .   Per test claim: The current 30 day co-pay is, $0.00.  No PA needed at this time. This test claim was processed through [facility]- copay amounts may vary at other pharmacies due to pharmacy/plan contracts, or as the patient moves through the different stages of their insurance plan.

## 2023-06-03 ENCOUNTER — Ambulatory Visit: Payer: 59 | Admitting: Urology

## 2023-06-14 ENCOUNTER — Ambulatory Visit: Admitting: Internal Medicine

## 2023-06-14 ENCOUNTER — Encounter: Payer: Self-pay | Admitting: Internal Medicine

## 2023-06-14 VITALS — BP 138/84 | HR 88 | Ht 65.0 in | Wt 200.0 lb

## 2023-06-14 DIAGNOSIS — H9201 Otalgia, right ear: Secondary | ICD-10-CM | POA: Diagnosis not present

## 2023-06-14 MED ORDER — PREDNISONE 10 MG PO TABS: ORAL_TABLET | ORAL | 0 refills | Status: AC

## 2023-06-14 NOTE — Progress Notes (Signed)
 Date:  06/14/2023   Name:  Jade Harris   DOB:  [birth year]   MRN:  [id number]   Chief Complaint: Ear Pain (Patient said she was on a airplane Sunday her ear popped since then having right ear pain, has been taking ibuprofen , no drainage)  Otalgia  There is pain in the right ear. This is a new problem. The current episode started in the past 7 days. The problem occurs constantly. The problem has been unchanged. Associated symptoms include hearing loss. Pertinent negatives include no coughing, ear discharge, headaches, neck pain or sore throat.    Review of Systems  Constitutional:  Negative for chills, fatigue and fever.  HENT:  Positive for ear pain and hearing loss. Negative for ear discharge and sore throat.   Respiratory:  Negative for cough, chest tightness and wheezing.   Musculoskeletal:  Negative for neck pain.  Neurological:  Negative for dizziness, light-headedness and headaches.     Lab Results  Component Value Date   NA 143 10/29/2022   K 5.0 10/29/2022   CO2 24 10/29/2022   GLUCOSE 91 10/29/2022   BUN 9 10/29/2022   CREATININE 0.76 10/29/2022   CALCIUM  10.1 10/29/2022   EGFR 90 10/29/2022   GFRNONAA >60 04/08/2022   Lab Results  Component Value Date   CHOL 189 10/29/2022   HDL 83 10/29/2022   LDLCALC 91 10/29/2022   TRIG 86 10/29/2022   CHOLHDL 2.3 10/29/2022   Lab Results  Component Value Date   TSH 1.070 12/05/2020   Lab Results  Component Value Date   HGBA1C 5.5 04/08/2022   Lab Results  Component Value Date   WBC 8.8 04/08/2022   HGB 12.2 04/08/2022   HCT 38.3 04/08/2022   MCV 89.9 04/08/2022   PLT 263 04/08/2022   Lab Results  Component Value Date   ALT 15 10/29/2022   AST 16 10/29/2022   ALKPHOS 81 10/29/2022   BILITOT 0.3 10/29/2022   Lab Results  Component Value Date   25OHVITD2 <1.0 01/25/2020   25OHVITD3 29 01/25/2020   VD25OH 29.0 (L) 12/05/2020     Patient Active Problem List   Diagnosis Date Noted   Chronic foot  pain, left 10/29/2022   Primary hypertension 06/05/2022   Chronic migraine with aura without status migrainosus, not intractable 05/04/2022   TIA (transient ischemic attack) 04/17/2022   Environmental allergies 06/20/2021   S/P TAH (total abdominal hysterectomy) 12/04/2020   Chronic sacroiliac joint pain (Right) 02/24/2020   Other spondylosis, sacral and sacrococcygeal region 02/24/2020   Enthesopathy of sacroiliac joint (Right) 02/24/2020   Vitamin D  insufficiency 02/03/2020   Lumbar facet hypertrophy 02/03/2020   Cervical facet hypertrophy (Multilevel) (Bilateral) 02/03/2020   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) 02/03/2020   History of marijuana use 02/03/2020   Spondylosis without myelopathy or radiculopathy, lumbosacral region 02/03/2020   Chronic low back pain (1ry area of Pain) (Bilateral) (R>L) w/o sciatica 01/25/2020   Lumbosacral facet syndrome (Bilateral) (R>L) 01/25/2020   Chronic musculoskeletal pain 01/25/2020   Chronic lower extremity pain (2ry area of Pain) (Right) 01/25/2020   Chronic groin pain (Intermittent) (Right) 01/25/2020   Lumbosacral radiculopathy/radiculitis at L2 (Right) 01/25/2020   Chronic neck pain (Bilateral) (L>R) 01/25/2020   Cervicalgia 01/25/2020   Cervicogenic headache (Left) 01/25/2020   Cervico-occipital neuralgia (Left) 01/25/2020   Cervical radiculopathy 01/25/2020   Numbness and tingling of upper extremity (Left) 01/25/2020   DDD (degenerative disc disease), cervical 01/25/2020   DDD (degenerative disc  disease), lumbosacral 01/25/2020   Discogenic low back pain 01/25/2020   Chronic pain syndrome 01/24/2020   Disorder of skeletal system 01/24/2020   Slow transit constipation 11/25/2018   Venous insufficiency of both lower extremities 11/25/2018   Eczema 08/23/2017   Headache, chronic daily 09/20/2015   Dyslipidemia 07/04/2014   Menopause 07/04/2014   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   Idiopathic  insomnia 07/04/2014    Allergies  Allergen Reactions   Sulfa Antibiotics Other (See Comments)    Past Surgical History:  Procedure Laterality Date   APPENDECTOMY     MOUTH SURGERY     NASAL SINUS SURGERY  04/2014   TONSILLECTOMY     TOTAL ABDOMINAL HYSTERECTOMY W/ BILATERAL SALPINGOOPHORECTOMY     wtih oophorectomy    Social History   Tobacco Use   Smoking status: Former    Current packs/day: 0.00    Types: Cigarettes    Quit date: 1989    Years since quitting: 36.3   Smokeless tobacco: Never  Vaping Use   Vaping status: Never Used  Substance Use Topics   Alcohol use: Yes    Alcohol/week: 0.0 standard drinks of alcohol    Comment: occasional   Drug use: No     Medication list has been reviewed and updated.  Current Meds  Medication Sig   acetaminophen  (TYLENOL ) 500 MG tablet Take 500 mg by mouth every 6 (six) hours as needed.   albuterol  (VENTOLIN  HFA) 108 (90 Base) MCG/ACT inhaler INHALE 2 PUFFS INTO THE LUNGS EVERY 6 HOURS AS NEEDED FOR WHEEZING OR SHORTNESS OF BREATH   ASPIRIN  LOW DOSE 81 MG tablet Take 81 mg by mouth daily.   B Complex Vitamins (B COMPLEX PO) Take 1 tablet by mouth daily.   Calcipotriene-Betameth Diprop 0.005-0.064 % FOAM Apply topically as needed. Eczema   diphenhydrAMINE  (BENADRYL ) 25 MG tablet Take 25 mg by mouth every 6 (six) hours as needed.   DULoxetine  (CYMBALTA ) 30 MG capsule TAKE 3 CAPSULES(90 MG) BY MOUTH DAILY   EPINEPHrine  (EPIPEN  2-PAK) 0.3 mg/0.3 mL IJ SOAJ injection Inject 0.3 mg into the muscle as needed for anaphylaxis.   fluticasone  (FLONASE ) 50 MCG/ACT nasal spray SHAKE LIQUID AND USE 2 SPRAYS IN EACH NOSTRIL DAILY   ibuprofen  (ADVIL ) 400 MG tablet Take 400 mg by mouth every 6 (six) hours as needed.   ipratropium (ATROVENT ) 0.03 % nasal spray Place 2 sprays into both nostrils every 12 (twelve) hours.   Naproxen Sodium (ALEVE PO) Take by mouth daily.   olmesartan  (BENICAR ) 20 MG tablet TAKE 1 TABLET(20 MG) BY MOUTH DAILY    predniSONE  (DELTASONE ) 10 MG tablet Take 6 tablets (60 mg total) by mouth daily with breakfast for 1 day, THEN 5 tablets (50 mg total) daily with breakfast for 1 day, THEN 4 tablets (40 mg total) daily with breakfast for 1 day, THEN 3 tablets (30 mg total) daily with breakfast for 1 day, THEN 2 tablets (20 mg total) daily with breakfast for 1 day, THEN 1 tablet (10 mg total) daily with breakfast for 1 day.   QULIPTA  60 MG TABS TAKE 1 TABLET(60 MG) BY MOUTH DAILY AT 6 AM   Rimegepant Sulfate (NURTEC) 75 MG TBDP Take 1 tablet (75 mg total) by mouth daily as needed.       06/14/2023    1:57 PM 03/12/2023   10:26 AM 10/29/2022    8:48 AM 08/01/2022    3:41 PM  GAD 7 : Generalized Anxiety Score  Nervous, Anxious,  on Edge 0 0 0 0  Control/stop worrying 0 0 0 0  Worry too much - different things 0 0 0   Trouble relaxing 0 0 0 0  Restless 0 0 0 0  Easily annoyed or irritable 0 0 0 0  Afraid - awful might happen 0 0 0 0  Total GAD 7 Score 0 0 0   Anxiety Difficulty Not difficult at all Not difficult at all Not difficult at all Not difficult at all       06/14/2023    1:56 PM 03/12/2023   10:26 AM 10/29/2022    8:47 AM  Depression screen PHQ 2/9  Decreased Interest 0 0 0  Down, Depressed, Hopeless 0 0 0  PHQ - 2 Score 0 0 0  Altered sleeping 0  3  Tired, decreased energy 3  0  Change in appetite 2  3  Feeling bad or failure about yourself  0  0  Trouble concentrating   0  Moving slowly or fidgety/restless   0  Suicidal thoughts 0  0  PHQ-9 Score 5  6  Difficult doing work/chores   Not difficult at all    BP Readings from Last 3 Encounters:  06/14/23 138/84  03/12/23 (!) 126/92  10/29/22 128/78    Physical Exam Vitals and nursing note reviewed.  Constitutional:      General: She is not in acute distress.    Appearance: She is well-developed.  HENT:     Head: Normocephalic and atraumatic.     Right Ear: Decreased hearing noted. Tympanic membrane is scarred. Tympanic membrane is  not perforated, erythematous or retracted.     Left Ear: Tympanic membrane is not scarred, perforated, erythematous or retracted.  Neck:     Vascular: No carotid bruit.  Cardiovascular:     Rate and Rhythm: Normal rate and regular rhythm.  Pulmonary:     Effort: Pulmonary effort is normal. No respiratory distress.     Breath sounds: No wheezing or rhonchi.  Musculoskeletal:     Cervical back: Normal range of motion.  Lymphadenopathy:     Cervical: No cervical adenopathy.  Skin:    General: Skin is warm and dry.     Findings: No rash.  Neurological:     Mental Status: She is alert and oriented to person, place, and time.  Psychiatric:        Mood and Affect: Mood normal.        Behavior: Behavior normal.     Wt Readings from Last 3 Encounters:  06/14/23 200 lb (90.7 kg)  03/12/23 209 lb (94.8 kg)  10/29/22 206 lb 3.2 oz (93.5 kg)    BP 138/84   Pulse 88   Ht 5\' 5"  (1.651 m)   Wt 200 lb (90.7 kg)   SpO2 97%   BMI 33.28 kg/m   Assessment and Plan:  Problem List Items Addressed This Visit   None Visit Diagnoses       Otalgia, right ear    -  Primary   likely due to eustachian tube dysfunction - no evidence of infection continue tylenol  for pain, oral decongestants will give steroid taper   Relevant Medications   predniSONE  (DELTASONE ) 10 MG tablet       No follow-ups on file.    Sheron Dixons, MD [facility] Primary Care and [facility] Medicine [place]

## 2023-06-15 ENCOUNTER — Other Ambulatory Visit: Payer: Self-pay | Admitting: Internal Medicine

## 2023-06-15 DIAGNOSIS — I10 Essential (primary) hypertension: Secondary | ICD-10-CM

## 2023-06-18 NOTE — Telephone Encounter (Signed)
 Requested medication (s) are due for refill today: yes  Requested medication (s) are on the active medication list: yes  Last refill:  11/28/22 #90/1  Future visit scheduled: no  Notes to clinic:  Unable to refill per protocol due to failed labs, no updated results.      Requested Prescriptions  Pending Prescriptions Disp Refills   olmesartan  (BENICAR ) 20 MG tablet [Pharmacy Med Name: OLMESARTAN  MEDOXOMIL 20MG  TABLETS] 90 tablet 1    Sig: TAKE 1 TABLET(20 MG) BY MOUTH DAILY     Cardiovascular:  Angiotensin Receptor Blockers Failed - 06/18/2023 11:35 AM      Failed - Cr in normal range and within 180 days    Creatinine, Ser  Date Value Ref Range Status  10/29/2022 0.76 0.57 - 1.00 mg/dL Final         Failed - K in normal range and within 180 days    Potassium  Date Value Ref Range Status  10/29/2022 5.0 3.5 - 5.2 mmol/L Final         Passed - Patient is not pregnant      Passed - Last BP in normal range    BP Readings from Last 1 Encounters:  06/14/23 138/84         Passed - Valid encounter within last 6 months    Recent Outpatient Visits           4 days ago Otalgia, right ear   [facility], Chales [place], MD   3 months ago Acute cystitis with hematuria   [facility], Arleen Lacer, [place]       Future Appointments             In 1 month MacDiarmid, Geralyn Knee, MD [facility]

## 2023-07-29 ENCOUNTER — Ambulatory Visit: Admitting: Urology

## 2023-08-11 ENCOUNTER — Other Ambulatory Visit: Payer: Self-pay | Admitting: Internal Medicine

## 2023-08-13 NOTE — Telephone Encounter (Signed)
 Requested Prescriptions  Pending Prescriptions Disp Refills   DULoxetine  (CYMBALTA ) 30 MG capsule [Pharmacy Med Name: DULOXETINE  DR 30MG  CAPSULES] 90 capsule 0    Sig: TAKE 3 CAPSULES(90 MG) BY MOUTH DAILY     Psychiatry: Antidepressants - SNRI - duloxetine  Passed - 08/13/2023  4:34 PM      Passed - Cr in normal range and within 360 days    Creatinine, Ser  Date Value Ref Range Status  10/29/2022 0.76 0.57 - 1.00 mg/dL Final         Passed - eGFR is 30 or above and within 360 days    GFR calc Af Amer  Date Value Ref Range Status  01/25/2020 114 >59 mL/min/1.73 Final    Comment:    **In accordance with recommendations from the NKF-ASN Task force,**   [facility] is in the process of updating its eGFR calculation to the   2021 CKD-EPI creatinine equation that estimates kidney function   without a race variable.    GFR, Estimated  Date Value Ref Range Status  04/08/2022 >60 >60 mL/min Final    Comment:    (NOTE) Calculated using the CKD-EPI Creatinine Equation (2021)    eGFR  Date Value Ref Range Status  10/29/2022 90 >59 mL/min/1.73 Final         Passed - Completed PHQ-2 or PHQ-9 in the last 360 days      Passed - Last BP in normal range    BP Readings from Last 1 Encounters:  06/14/23 138/84         Passed - Valid encounter within last 6 months    Recent Outpatient Visits           2 months ago Otalgia, right ear   [facility], Leita DEL, MD   5 months ago Acute cystitis with hematuria   [facility] at [facility], Toribio SQUIBB, [place]       Future Appointments             In 1 month MacDiarmid, [place], MD [facility]

## 2023-09-14 ENCOUNTER — Other Ambulatory Visit: Payer: Self-pay | Admitting: Internal Medicine

## 2023-09-17 NOTE — Telephone Encounter (Signed)
 Requested Prescriptions  Pending Prescriptions Disp Refills   DULoxetine  (CYMBALTA ) 30 MG capsule [Pharmacy Med Name: DULOXETINE  DR 30MG  CAPSULES] 90 capsule 0    Sig: TAKE 3 CAPSULES(90 MG) BY MOUTH DAILY     Psychiatry: Antidepressants - SNRI - duloxetine  Passed - 09/17/2023  3:04 PM      Passed - Cr in normal range and within 360 days    Creatinine, Ser  Date Value Ref Range Status  10/29/2022 0.76 0.57 - 1.00 mg/dL Final         Passed - eGFR is 30 or above and within 360 days    GFR calc Af Amer  Date Value Ref Range Status  01/25/2020 114 >59 mL/min/1.73 Final    Comment:    **In accordance with recommendations from the NKF-ASN Task force,**   [facility] is in the process of updating its eGFR calculation to the   2021 CKD-EPI creatinine equation that estimates kidney function   without a race variable.    GFR, Estimated  Date Value Ref Range Status  04/08/2022 >60 >60 mL/min Final    Comment:    (NOTE) Calculated using the CKD-EPI Creatinine Equation (2021)    eGFR  Date Value Ref Range Status  10/29/2022 90 >59 mL/min/1.73 Final         Passed - Completed PHQ-2 or PHQ-9 in the last 360 days      Passed - Last BP in normal range    BP Readings from Last 1 Encounters:  06/14/23 138/84         Passed - Valid encounter within last 6 months    Recent Outpatient Visits           3 months ago Otalgia, right ear   [facility], Leita DEL, MD   6 months ago Acute cystitis with hematuria   [facility] at [facility], Toribio SQUIBB, [place]       Future Appointments             In 2 weeks Gaston Hamilton, MD [facility]

## 2023-10-07 ENCOUNTER — Ambulatory Visit: Admitting: Urology

## 2023-10-21 ENCOUNTER — Other Ambulatory Visit: Payer: Self-pay | Admitting: Internal Medicine

## 2023-10-22 ENCOUNTER — Other Ambulatory Visit: Payer: Self-pay

## 2023-10-22 NOTE — Telephone Encounter (Signed)
 Requested medication (s) are due for refill today: yes   Requested medication (s) are on the active medication list: yes   Last refill:  09/17/23 #90 0 refills  Future visit scheduled: no   Notes to clinic:  no refills remain. Do you want to continue refills?     Requested Prescriptions  Pending Prescriptions Disp Refills   DULoxetine  (CYMBALTA ) 30 MG capsule [Pharmacy Med Name: DULOXETINE  DR 30MG  CAPSULES] 90 capsule 0    Sig: TAKE 3 CAPSULES(90 MG) BY MOUTH DAILY     Psychiatry: Antidepressants - SNRI - duloxetine  Passed - 10/22/2023  9:43 AM      Passed - Cr in normal range and within 360 days    Creatinine, Ser  Date Value Ref Range Status  10/29/2022 0.76 0.57 - 1.00 mg/dL Final         Passed - eGFR is 30 or above and within 360 days    GFR calc Af Amer  Date Value Ref Range Status  01/25/2020 114 >59 mL/min/1.73 Final    Comment:    **In accordance with recommendations from the NKF-ASN Task force,**   [facility] is in the process of updating its eGFR calculation to the   2021 CKD-EPI creatinine equation that estimates kidney function   without a race variable.    GFR, Estimated  Date Value Ref Range Status  04/08/2022 >60 >60 mL/min Final    Comment:    (NOTE) Calculated using the CKD-EPI Creatinine Equation (2021)    eGFR  Date Value Ref Range Status  10/29/2022 90 >59 mL/min/1.73 Final         Passed - Completed PHQ-2 or PHQ-9 in the last 360 days      Passed - Last BP in normal range    BP Readings from Last 1 Encounters:  06/14/23 138/84         Passed - Valid encounter within last 6 months    Recent Outpatient Visits           4 months ago Otalgia, right ear   [facility], Jade DEL, Jade Harris   7 months ago Acute cystitis with hematuria   [facility] at [facility] Jade Harris, Jade Harris
